# Patient Record
Sex: Male | Born: 1955 | Race: White | Hispanic: No | Marital: Single | Smoking: Never smoker
Health system: Southern US, Community
[De-identification: ages and names within clinical notes are randomized; demographics above are authoritative.]

## PROBLEM LIST (undated history)

## (undated) DIAGNOSIS — I10 Essential (primary) hypertension: Secondary | ICD-10-CM

## (undated) DIAGNOSIS — F419 Anxiety disorder, unspecified: Secondary | ICD-10-CM

## (undated) DIAGNOSIS — N393 Stress incontinence (female) (male): Secondary | ICD-10-CM

## (undated) DIAGNOSIS — E785 Hyperlipidemia, unspecified: Secondary | ICD-10-CM

## (undated) DIAGNOSIS — F411 Generalized anxiety disorder: Secondary | ICD-10-CM

## (undated) DIAGNOSIS — I2510 Atherosclerotic heart disease of native coronary artery without angina pectoris: Secondary | ICD-10-CM

## (undated) DIAGNOSIS — E538 Deficiency of other specified B group vitamins: Secondary | ICD-10-CM

## (undated) DIAGNOSIS — N183 Chronic kidney disease, stage 3 unspecified: Secondary | ICD-10-CM

## (undated) DIAGNOSIS — R519 Headache, unspecified: Secondary | ICD-10-CM

## (undated) DIAGNOSIS — Z8546 Personal history of malignant neoplasm of prostate: Secondary | ICD-10-CM

## (undated) DIAGNOSIS — K219 Gastro-esophageal reflux disease without esophagitis: Secondary | ICD-10-CM

## (undated) DIAGNOSIS — C801 Malignant (primary) neoplasm, unspecified: Secondary | ICD-10-CM

## (undated) HISTORY — DX: Essential (primary) hypertension: I10

## (undated) HISTORY — DX: Deficiency of other specified B group vitamins: E53.8

## (undated) HISTORY — DX: Chronic kidney disease, stage 3 unspecified: N18.30

## (undated) HISTORY — PX: PROSTATECTOMY: SHX69

## (undated) HISTORY — DX: Hyperlipidemia, unspecified: E78.5

## (undated) HISTORY — DX: Generalized anxiety disorder: F41.1

## (undated) HISTORY — DX: Personal history of malignant neoplasm of prostate: Z85.46

## (undated) HISTORY — DX: Stress incontinence (female) (male): N39.3

---

## 2005-06-03 ENCOUNTER — Encounter: Admission: RE | Admit: 2005-06-03 | Discharge: 2005-06-03 | Payer: Self-pay | Admitting: Internal Medicine

## 2016-02-04 ENCOUNTER — Other Ambulatory Visit: Payer: Self-pay | Admitting: Urology

## 2016-02-04 DIAGNOSIS — C61 Malignant neoplasm of prostate: Secondary | ICD-10-CM

## 2016-02-08 ENCOUNTER — Ambulatory Visit
Admission: RE | Admit: 2016-02-08 | Discharge: 2016-02-08 | Disposition: A | Discharge status: Home / Self Care | Payer: No Typology Code available for payment source | Source: Ambulatory Visit | Attending: Urology | Admitting: Urology

## 2016-02-08 DIAGNOSIS — C61 Malignant neoplasm of prostate: Secondary | ICD-10-CM

## 2016-02-08 MED ORDER — IOHEXOL 300 MG/ML  SOLN
30.0000 mL | Freq: Once | INTRAMUSCULAR | Status: AC | PRN
  Administered 2016-02-08: 30 mL via ORAL

## 2016-02-08 MED ORDER — IOPAMIDOL (ISOVUE-300) INJECTION 61%
100.0000 mL | Freq: Once | INTRAVENOUS | Status: AC | PRN
  Administered 2016-02-08: 100 mL via INTRAVENOUS

## 2019-08-26 DIAGNOSIS — R079 Chest pain, unspecified: Secondary | ICD-10-CM | POA: Diagnosis not present

## 2019-08-26 DIAGNOSIS — I10 Essential (primary) hypertension: Secondary | ICD-10-CM | POA: Diagnosis not present

## 2019-08-26 DIAGNOSIS — F419 Anxiety disorder, unspecified: Secondary | ICD-10-CM | POA: Diagnosis not present

## 2019-09-05 ENCOUNTER — Encounter: Payer: Self-pay | Admitting: Internal Medicine

## 2019-09-05 ENCOUNTER — Ambulatory Visit: Payer: PPO | Admitting: Internal Medicine

## 2019-09-05 ENCOUNTER — Other Ambulatory Visit: Payer: Self-pay

## 2019-09-05 VITALS — BP 138/80 | HR 58 | Temp 97.7°F | Ht 65.0 in | Wt 184.6 lb

## 2019-09-05 DIAGNOSIS — E785 Hyperlipidemia, unspecified: Secondary | ICD-10-CM

## 2019-09-05 DIAGNOSIS — R079 Chest pain, unspecified: Secondary | ICD-10-CM

## 2019-09-05 DIAGNOSIS — I10 Essential (primary) hypertension: Secondary | ICD-10-CM | POA: Diagnosis not present

## 2019-09-05 NOTE — Progress Notes (Signed)
Cardiology Office Note:    Date:  09/05/2019   ID:  Rodney Young, DOB 03/10/1956, MRN [id number]  PCP:  Rodney Roger, MD  Cardiologist:  Elouise Munroe, MD  Electrophysiologist:  None   Referring MD: Rodney Young, Rodney Cornea, MD   Chief Complaint: left chest pain shoulder pain  History of Present Illness:    Rodney Young is a 64 y.o. male with a hx of hypertension and hyperlipidemia presents today for evaluation of atypical chest pain.  His chest pain began in December 2020 and is intermittent.  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  His work is physical, and he will note that when he has to bend over and stand up when working, he will have pain on left side that he describes as sharp pain. Pain lasts 44mins.  He has to lift boxes and heavy items as part of his work. He feels this is a new sensation, and at times feels as if a knife is sticking in his back.he will also noticed that when he has to bend over his leg starts burning.  He notes that the discomfort in his chest is accompanied by a pain lifting his shoulder.  He demonstrates that he has difficulty lifting his arm above his head.  He also does heavy yard work including weed eating.  He is able to lift 50 pound bags of deer feed corn.  This activity does not bring on his chest discomfort or shoulder pain.  Nonsmoker. No diabetes.  Does have hyperlipidemia. Has indigestion and started opemprazole 40 mg   Father mi 20 died Brother open heart surgery and pacemaker Sister pacemaker Brother stents Past Medical History:  Diagnosis Date  . Hyperlipidemia   . Hypertension     History reviewed. No pertinent surgical history.  Current Medications: Current Meds  Medication Sig  . ALPRAZolam (XANAX) 0.5 MG tablet Take 0.5 mg by mouth 2 (two) times daily as needed.  Marland Kitchen amLODipine (NORVASC) 5 MG tablet Take 5 mg by mouth daily.  Marland Kitchen atorvastatin (LIPITOR) 20 MG tablet Take 20 mg by mouth. Take one tablet by mouth every  other day  . lisinopril-hydrochlorothiazide (ZESTORETIC) 20-12.5 MG tablet   . omeprazole (PRILOSEC) 40 MG capsule Take 40 mg by mouth daily.     Allergies:   Patient has no allergy information on record.   Social History   Socioeconomic History  . Marital status: Unknown    Spouse name: Not on file  . Number of children: Not on file  . Years of education: Not on file  . Highest education level: Not on file  Occupational History  . Not on file  Tobacco Use  . Smoking status: Never Smoker  . Smokeless tobacco: Never Used  Substance and Sexual Activity  . Alcohol use: Not Currently  . Drug use: Never  . Sexual activity: Not on file  Other Topics Concern  . Not on file  Social History Narrative  . Not on file   Social Determinants of Health   Financial Resource Strain:   . Difficulty of Paying Living Expenses: Not on file  Food Insecurity:   . Worried About Charity fundraiser in the Last Year: Not on file  . Ran Out of Food in the Last Year: Not on file  Transportation Needs:   . Lack of Transportation (Medical): Not on file  . Lack of Transportation (Non-Medical): Not on file  Physical Activity:   . Days  of Exercise per Week: Not on file  . Minutes of Exercise per Session: Not on file  Stress:   . Feeling of Stress : Not on file  Social Connections:   . Frequency of Communication with Friends and Family: Not on file  . Frequency of Social Gatherings with Friends and Family: Not on file  . Attends Religious Services: Not on file  . Active Member of Clubs or Organizations: Not on file  . Attends Archivist Meetings: Not on file  . Marital Status: Not on file     Family History: The patient's family history includes Heart attack in his father.  Per HPI.  ROS:   Please see the history of present illness.    All other systems reviewed and are negative.  EKGs/Labs/Other Studies Reviewed:    The following studies were reviewed today:  EKG:  Sinus  bradycardia rate 58  Recent Labs: No results found for requested labs within last 8760 hours.  Recent Lipid Panel No results found for: CHOL, TRIG, HDL, CHOLHDL, VLDL, LDLCALC, LDLDIRECT  Physical Exam:    VS:  BP 138/80   Pulse (!) 58   Temp 97.7 F (36.5 C)   Ht 5\' 5"  (1.651 m)   Wt 184 lb 9.6 oz (83.7 kg)   SpO2 98%   BMI 30.72 kg/m     Wt Readings from Last 5 Encounters:  09/05/19 184 lb 9.6 oz (83.7 kg)     Constitutional: No acute distress Eyes: sclera non-icteric, normal conjunctiva and lids ENMT: Mask in place over nose mouth Cardiovascular: regular rhythm, normal rate, no murmurs. S1 and S2 normal. Radial pulses normal bilaterally. No jugular venous distention.  Respiratory: clear to auscultation bilaterally GI : normal bowel sounds, soft and nontender. No distention.   MSK: extremities warm, well perfused. No edema.  Patient is able to abduct the left arm laterally to level of the shoulder, but is limited by pain beyond that point.  No significant point tenderness NEURO: grossly nonfocal exam, moves all extremities. PSYCH: alert and oriented x 3, normal mood and affect.      ASSESSMENT:    1. Chest pain of uncertain etiology   2. Essential hypertension   3. Hyperlipidemia, unspecified hyperlipidemia type    PLAN:    Chest pain-she has chest pain and left arm pain.  His shoulder pain sounds primarily musculoskeletal and I have recommended that he speak to his primary care physician about a rotator cuff injury.  His chest pain however does occur with activities at his work, and with his risk factors of hypertension, hyperlipidemia, and family history, it would be best to exclude ischemia.  This can be accomplished with an exercise treadmill test given his normal ECG.  If this is normal, it is unlikely that further testing is needed, however we can certainly perform a coronary calcium scan to further restratify if indicated, though patient is currently on Lipitor 20  mg every other day.  Per medical records provided by PCP, he did not tolerate pravastatin.  HTN -hypertension with reasonable control today, continue amlodipine, lisinopril-HCTZ.  Hyperlipidemia-continue Lipitor 20 mg every other day.  Patient is following lipids with his primary care physician, next check should be forwarded to our office.  Total time of encounter: 45 minutes total time of encounter, including 30 minutes spent in face-to-face patient care. This time includes coordination of care and counseling regarding above mentioned issues. Remainder of non-face-to-face time involved reviewing chart documents/testing relevant to the patient  encounter and documentation in the medical record.  I also reviewed approximately 7 pages of outside medical records in conjunction with this consultation.  Cherlynn Kaiser, MD [place]  [facility]   Medication Adjustments/Labs and Tests Ordered: Current medicines are reviewed at length with the patient today.  Concerns regarding medicines are outlined above.  Orders Placed This Encounter  Procedures  . EXERCISE TOLERANCE TEST (ETT)  . EKG 12-Lead   No orders of the defined types were placed in this encounter.   Patient Instructions  Medication Instructions:  No changes    Lab Work: Not needed  Testing/Procedures: Will be schedule at [address] you will need to have a covid test 3 days prior to test - please go to [address]  [facility]  3 days of self isolate before the test. Your physician has requested that you have an exercise tolerance test.  Please also follow instruction sheet, as given.  Follow-Up: At [facility], you and your health needs are our priority.  As part of our continuing mission to provide you with exceptional heart care, we have created designated Provider Care Teams.  These Care Teams include your primary Cardiologist (physician) and Advanced Practice  Providers (APPs -  Physician Assistants and Nurse Practitioners) who all work together to provide you with the care you need, when you need it.  Your next appointment:   1 month(s)  The format for your next appointment:   In Person  Provider:   Cherlynn Kaiser, MD  Other Instructions n/a

## 2019-09-05 NOTE — Patient Instructions (Addendum)
Medication Instructions:  No changes    Lab Work: Not needed  Testing/Procedures: Will be schedule at [address] you will need to have a covid test 3 days prior to test - please go to [address]  ( [facility] )  3 days of self isolate before the test. Your physician has requested that you have an exercise tolerance test.  Please also follow instruction sheet, as given.  Follow-Up: At [facility], you and your health needs are our priority.  As part of our continuing mission to provide you with exceptional heart care, we have created designated Provider Care Teams.  These Care Teams include your primary Cardiologist (physician) and Advanced Practice Providers (APPs -  Physician Assistants and Nurse Practitioners) who all work together to provide you with the care you need, when you need it.  Your next appointment:   1 month(s)  The format for your next appointment:   In Person  Provider:   Cherlynn Kaiser, MD  Other Instructions n/a

## 2019-09-18 ENCOUNTER — Encounter: Payer: Self-pay | Admitting: Internal Medicine

## 2019-10-03 ENCOUNTER — Telehealth (HOSPITAL_COMMUNITY): Payer: Self-pay

## 2019-10-03 NOTE — Telephone Encounter (Signed)
Encounter complete. 

## 2019-10-04 ENCOUNTER — Other Ambulatory Visit (HOSPITAL_COMMUNITY)
Admission: RE | Admit: 2019-10-04 | Discharge: 2019-10-04 | Disposition: A | Discharge status: Home / Self Care | Payer: PPO | Source: Ambulatory Visit | Attending: Internal Medicine | Admitting: Internal Medicine

## 2019-10-04 DIAGNOSIS — Z20822 Contact with and (suspected) exposure to covid-19: Secondary | ICD-10-CM | POA: Diagnosis not present

## 2019-10-04 LAB — SARS CORONAVIRUS 2 (TAT 6-24 HRS): SARS Coronavirus 2: NEGATIVE

## 2019-10-08 ENCOUNTER — Other Ambulatory Visit: Payer: Self-pay

## 2019-10-08 ENCOUNTER — Ambulatory Visit (HOSPITAL_COMMUNITY)
Admission: RE | Admit: 2019-10-08 | Discharge: 2019-10-08 | Disposition: A | Discharge status: Home / Self Care | Payer: PPO | Source: Ambulatory Visit | Attending: Internal Medicine | Admitting: Internal Medicine

## 2019-10-08 DIAGNOSIS — R079 Chest pain, unspecified: Secondary | ICD-10-CM | POA: Diagnosis not present

## 2019-10-08 LAB — EXERCISE TOLERANCE TEST
Estimated workload: 6.8 METS
Exercise duration (min): 4 min
Exercise duration (sec): 54 s
MPHR: 155 {beats}/min
Peak HR: 136 {beats}/min
Percent HR: 87 %
RPE: 18
Rest HR: 56 {beats}/min

## 2019-10-10 ENCOUNTER — Other Ambulatory Visit: Payer: Self-pay

## 2019-10-10 ENCOUNTER — Telehealth (INDEPENDENT_AMBULATORY_CARE_PROVIDER_SITE_OTHER): Payer: PPO | Admitting: Internal Medicine

## 2019-10-10 ENCOUNTER — Encounter: Payer: Self-pay | Admitting: Internal Medicine

## 2019-10-10 VITALS — BP 132/78 | HR 57 | Ht 65.0 in

## 2019-10-10 DIAGNOSIS — R079 Chest pain, unspecified: Secondary | ICD-10-CM

## 2019-10-10 DIAGNOSIS — R9439 Abnormal result of other cardiovascular function study: Secondary | ICD-10-CM

## 2019-10-10 MED ORDER — SODIUM CHLORIDE 0.9% FLUSH: 3.0000 mL | Freq: Two times a day (BID) | INTRAVENOUS | Status: DC

## 2019-10-10 NOTE — Progress Notes (Signed)
Virtual Visit via Telephone Note   This visit type was conducted due to national recommendations for restrictions regarding the COVID-19 Pandemic (e.g. social distancing) in an effort to limit this patient's exposure and mitigate transmission in our community.  Due to his co-morbid illnesses, this patient is at least at moderate risk for complications without adequate follow up.  This format is felt to be most appropriate for this patient at this time.  The patient did not have access to video technology/had technical difficulties with video requiring transitioning to audio format only (telephone).  All issues noted in this document were discussed and addressed.  No physical exam could be performed with this format.  Please refer to the patient's chart for his  consent to telehealth for [facility].   The patient was identified using 2 identifiers.  Date:  10/10/2019   ID:  Rodney Young, DOB [birth year], MRN [id number]  Patient Location: Home Provider Location: Office  PCP:  Townsend Roger, MD  Cardiologist:  Elouise Munroe, MD  Electrophysiologist:  None   Evaluation Performed:  Follow-Up Visit  Chief Complaint:  F/u abnormal stress test  History of Present Illness:    Rodney Young is a 64 y.o. male with hypertension and hyperlipidemia presents today for follow up of chest pain, with abnormal ETT.   Sunday morning chest was "hurting bad". He tells me he thinks something is wrong, and is concerned. Feels like a knife going through his back. This has been going on for 3-4 months.   He will come home and lay down for a while and goes away. Does not feel his work is strenuous, but does note more fatigue, and does recall chest pain after a busy day of work over the weekend.   He also tells me his leg feels like it's on fire from knee down. We discussed possible etiologies. Can consider peripheral arterial investigative studies.  We discussed results of ETT which is concerning  for ischemia.   Brother ppm, heart problems, stents Sister ppm  The patient does not have symptoms concerning for COVID-19 infection (fever, chills, cough, or new shortness of breath).    Past Medical History:  Diagnosis Date  . Hyperlipidemia   . Hypertension    No past surgical history on file.   Current Meds  Medication Sig  . ALPRAZolam (XANAX) 0.5 MG tablet Take 0.5 mg by mouth 2 (two) times daily as needed.  Marland Kitchen amLODipine (NORVASC) 5 MG tablet Take 5 mg by mouth daily.  Marland Kitchen atorvastatin (LIPITOR) 20 MG tablet Take 20 mg by mouth. Take one tablet by mouth every other day  . lisinopril-hydrochlorothiazide (ZESTORETIC) 20-12.5 MG tablet   . omeprazole (PRILOSEC) 40 MG capsule Take 40 mg by mouth daily.     Allergies:   Patient has no allergy information on record.   Social History   Tobacco Use  . Smoking status: Never Smoker  . Smokeless tobacco: Never Used  Substance Use Topics  . Alcohol use: Not Currently  . Drug use: Never     Family Hx: The patient's family history includes Heart attack in his father.  ROS:   Please see the history of present illness.     All other systems reviewed and are negative.   Prior CV studies:   The following studies were reviewed today:  ETT  Labs/Other Tests and Data Reviewed:    EKG:  No ECG reviewed.  Recent Labs: No results found for requested labs within last 8760  hours.   Recent Lipid Panel No results found for: CHOL, TRIG, HDL, CHOLHDL, LDLCALC, LDLDIRECT  Wt Readings from Last 3 Encounters:  09/05/19 184 lb 9.6 oz (83.7 kg)     Objective:    Vital Signs:  BP 132/78   Pulse (!) 57   Ht 5\' 5"  (1.651 m)   BMI 30.72 kg/m    VITAL SIGNS:  reviewed GEN:  no acute distress NEURO:  alert and oriented x 3, no obvious focal deficit PSYCH:  normal affect  ASSESSMENT & PLAN:    Chest pain Abnormal stress test -   We discussed his symptoms and abnormal stress test, and how this warrants a coronary angiogram  to define coronary anatomy. We discussed risks, benefits, alternatives of LHC and participated in shared decision making and informed consent. He is willing to proceed, we will schedule as soon as possible given high risk stress test findings and worsening chest pain over last 1 week.  INFORMED CONSENT: I have reviewed the risks, indications, and alternatives to cardiac catheterization, possible angioplasty, and stenting with the patient. Risks include but are not limited to bleeding, infection, vascular injury, stroke, myocardial infection, arrhythmia, kidney injury, radiation-related injury in the case of prolonged fluoroscopy use, emergency cardiac surgery, and death. The patient understands the risks of serious complication is 1-2 in 123XX123 with diagnostic cardiac cath and 1-2% or less with angioplasty/stenting.    COVID-19 Education: The signs and symptoms of COVID-19 were discussed with the patient and how to seek care for testing (follow up with PCP or arrange E-visit).  The importance of social distancing was discussed today.  Time:   Today, I have spent 25 minutes with the patient with telehealth technology discussing the above problems.     Medication Adjustments/Labs and Tests Ordered: Current medicines are reviewed at length with the patient today.  Concerns regarding medicines are outlined above.   Tests Ordered: No orders of the defined types were placed in this encounter.   Medication Changes: No orders of the defined types were placed in this encounter.   Follow Up: after cath  Signed, Elouise Munroe, MD  10/10/2019 8:51 AM    [place]

## 2019-10-10 NOTE — H&P (View-Only) (Signed)
Virtual Visit via Telephone Note   This visit type was conducted due to national recommendations for restrictions regarding the COVID-19 Pandemic (e.g. social distancing) in an effort to limit this patient's exposure and mitigate transmission in our community.  Due to his co-morbid illnesses, this patient is at least at moderate risk for complications without adequate follow up.  This format is felt to be most appropriate for this patient at this time.  The patient did not have access to video technology/had technical difficulties with video requiring transitioning to audio format only (telephone).  All issues noted in this document were discussed and addressed.  No physical exam could be performed with this format.  Please refer to the patient's chart for his  consent to telehealth for [facility].   The patient was identified using 2 identifiers.  Date:  10/10/2019   ID:  [name], DOB October 03, 1955, MRN [id number]  Patient Location: Home Provider Location: Office  PCP:  Townsend Roger, MD  Cardiologist:  Elouise Munroe, MD  Electrophysiologist:  None   Evaluation Performed:  Follow-Up Visit  Chief Complaint:  F/u abnormal stress test  History of Present Illness:    Rodney Young is a 64 y.o. male with hypertension and hyperlipidemia presents today for follow up of chest pain, with abnormal ETT.   Sunday morning chest was "hurting bad". He tells me he thinks something is wrong, and is concerned. Feels like a knife going through his back. This has been going on for 3-4 months.   He will come home and lay down for a while and goes away. Does not feel his work is strenuous, but does note more fatigue, and does recall chest pain after a busy day of work over the weekend.   He also tells me his leg feels like it's on fire from knee down. We discussed possible etiologies. Can consider peripheral arterial investigative studies.  We discussed results of ETT which is concerning  for ischemia.   Brother ppm, heart problems, stents Sister ppm  The patient does not have symptoms concerning for COVID-19 infection (fever, chills, cough, or new shortness of breath).    Past Medical History:  Diagnosis Date  . Hyperlipidemia   . Hypertension    No past surgical history on file.   Current Meds  Medication Sig  . ALPRAZolam (XANAX) 0.5 MG tablet Take 0.5 mg by mouth 2 (two) times daily as needed.  Marland Kitchen amLODipine (NORVASC) 5 MG tablet Take 5 mg by mouth daily.  Marland Kitchen atorvastatin (LIPITOR) 20 MG tablet Take 20 mg by mouth. Take one tablet by mouth every other day  . lisinopril-hydrochlorothiazide (ZESTORETIC) 20-12.5 MG tablet   . omeprazole (PRILOSEC) 40 MG capsule Take 40 mg by mouth daily.     Allergies:   Patient has no allergy information on record.   Social History   Tobacco Use  . Smoking status: Never Smoker  . Smokeless tobacco: Never Used  Substance Use Topics  . Alcohol use: Not Currently  . Drug use: Never     Family Hx: The patient's family history includes Heart attack in his father.  ROS:   Please see the history of present illness.     All other systems reviewed and are negative.   Prior CV studies:   The following studies were reviewed today:  ETT  Labs/Other Tests and Data Reviewed:    EKG:  No ECG reviewed.  Recent Labs: No results found for requested labs within last 8760  hours.   Recent Lipid Panel No results found for: CHOL, TRIG, HDL, CHOLHDL, LDLCALC, LDLDIRECT  Wt Readings from Last 3 Encounters:  09/05/19 184 lb 9.6 oz (83.7 kg)     Objective:    Vital Signs:  BP 132/78   Pulse (!) 57   Ht 5\' 5"  (1.651 m)   BMI 30.72 kg/m    VITAL SIGNS:  reviewed GEN:  no acute distress NEURO:  alert and oriented x 3, no obvious focal deficit PSYCH:  normal affect  ASSESSMENT & PLAN:    Chest pain Abnormal stress test -   We discussed his symptoms and abnormal stress test, and how this warrants a coronary angiogram  to define coronary anatomy. We discussed risks, benefits, alternatives of LHC and participated in shared decision making and informed consent. He is willing to proceed, we will schedule as soon as possible given high risk stress test findings and worsening chest pain over last 1 week.  INFORMED CONSENT: I have reviewed the risks, indications, and alternatives to cardiac catheterization, possible angioplasty, and stenting with the patient. Risks include but are not limited to bleeding, infection, vascular injury, stroke, myocardial infection, arrhythmia, kidney injury, radiation-related injury in the case of prolonged fluoroscopy use, emergency cardiac surgery, and death. The patient understands the risks of serious complication is 1-2 in 123XX123 with diagnostic cardiac cath and 1-2% or less with angioplasty/stenting.    COVID-19 Education: The signs and symptoms of COVID-19 were discussed with the patient and how to seek care for testing (follow up with PCP or arrange E-visit).  The importance of social distancing was discussed today.  Time:   Today, I have spent 25 minutes with the patient with telehealth technology discussing the above problems.     Medication Adjustments/Labs and Tests Ordered: Current medicines are reviewed at length with the patient today.  Concerns regarding medicines are outlined above.   Tests Ordered: No orders of the defined types were placed in this encounter.   Medication Changes: No orders of the defined types were placed in this encounter.   Follow Up: after cath  Signed, Elouise Munroe, MD  10/10/2019 8:51 AM    [place]

## 2019-10-10 NOTE — Patient Instructions (Addendum)
Medication Instructions:  Your physician recommends that you continue on your current medications as directed. Please refer to the Current Medication list given to you today.  If you need a refill on your cardiac medications before your next appointment, please call your pharmacy.   Lab work: BMET, CBC If you have labs (blood work) drawn today and your tests are completely normal, you will receive your results only by: [place] (if you have MyChart) OR A paper copy in the mail If you have any lab test that is abnormal or we need to change your treatment, we will call you to review the results.  Testing/Procedures: Your physician has requested that you have a cardiac catheterization on 3/15 at 7:30am. Cardiac catheterization is used to diagnose and/or treat various heart conditions. Doctors may recommend this procedure for a number of different reasons. The most common reason is to evaluate chest pain. Chest pain can be a symptom of coronary artery disease (CAD), and cardiac catheterization can show whether plaque is narrowing or blocking your heart's arteries. This procedure is also used to evaluate the valves, as well as measure the blood flow and oxygen levels in different parts of your heart. For further information please visit [email or web address]. Please follow instruction sheet, as given.   Follow-Up: At [facility], you and your health needs are our priority.  As part of our continuing mission to provide you with exceptional heart care, we have created designated Provider Care Teams.  These Care Teams include your primary Cardiologist (physician) and Advanced Practice Providers (APPs -  Physician Assistants and Nurse Practitioners) who all work together to provide you with the care you need, when you need it. You may see Elouise Munroe, MD or one of the following Advanced Practice Providers on your designated Care Team:    Rosaria Ferries, PA-C  Jory Sims, DNP,  ANP  Cadence Kathlen Mody, NP  Your follow-up appointment will be made in the hospital after your procedure.   Any Other Special Instructions Will Be Listed Below (If Applicable).    [place] [address] Dept: [phone] Loc: [phone]  Jerome Barrett  10/10/2019  You are scheduled for a Cardiac Catheterization on Monday, March 15 with Dr. Quay Burow.  1. Please arrive at the [facility] (Main Entrance A) at [facility]: [address] [place], [place] [address] at 7:30 AM (This time is two hours before your procedure to ensure your preparation). Free valet parking service is available.   Special note: Every effort is made to have your procedure done on time. Please understand that emergencies sometimes delay scheduled procedures.  2. Diet: Do not eat solid foods after midnight.  The patient may have clear liquids until 5am upon the day of the procedure.  3. Labs: You will need to have blood drawn either today or tomorrow.   4. Medication instructions in preparation for your procedure:   Contrast Allergy: No  On the morning of your procedure, take your Aspirin and any morning medicines NOT listed above.  You may use sips of water.  5. Plan for one night stay--bring personal belongings. 6. Bring a current list of your medications and current insurance cards. 7. You MUST have a responsible person to drive you home. 8. Someone MUST be with you the first 24 hours after you arrive home or your discharge will be delayed. 9. Please wear clothes that are easy to  get on and off and wear slip-on shoes.   You have to be tested for Covid on 3/12 at 1:25pm. This is a Drive Up Visit at the [facility] [address], [place]. Someone will direct you to the appropriate testing line. Stay in your car and someone will be with you shortly. Thank you for  allowing [place] to care for you!    -- [place] [facility]

## 2019-10-11 ENCOUNTER — Other Ambulatory Visit (HOSPITAL_COMMUNITY)
Admission: RE | Admit: 2019-10-11 | Discharge: 2019-10-11 | Disposition: A | Discharge status: Home / Self Care | Payer: PPO | Source: Ambulatory Visit | Attending: Cardiovascular Disease | Admitting: Cardiovascular Disease

## 2019-10-11 DIAGNOSIS — Z20822 Contact with and (suspected) exposure to covid-19: Secondary | ICD-10-CM | POA: Diagnosis not present

## 2019-10-11 DIAGNOSIS — Z01812 Encounter for preprocedural laboratory examination: Secondary | ICD-10-CM | POA: Insufficient documentation

## 2019-10-11 LAB — BASIC METABOLIC PANEL
BUN/Creatinine Ratio: 12 (ref 10–24)
BUN: 19 mg/dL (ref 8–27)
CO2: 24 mmol/L (ref 20–29)
Calcium: 9.6 mg/dL (ref 8.6–10.2)
Chloride: 100 mmol/L (ref 96–106)
Creatinine, Ser: 1.54 mg/dL — ABNORMAL HIGH (ref 0.76–1.27)
GFR calc Af Amer: 54 mL/min/{1.73_m2} — ABNORMAL LOW (ref >59)
GFR calc non Af Amer: 47 mL/min/{1.73_m2} — ABNORMAL LOW (ref >59)
Glucose: 91 mg/dL (ref 65–99)
Potassium: 4.1 mmol/L (ref 3.5–5.2)
Sodium: 140 mmol/L (ref 134–144)

## 2019-10-11 LAB — CBC

## 2019-10-12 LAB — SARS CORONAVIRUS 2 (TAT 6-24 HRS): SARS Coronavirus 2: NEGATIVE

## 2019-10-14 ENCOUNTER — Encounter (HOSPITAL_COMMUNITY)
Admission: RE | Disposition: A | Discharge status: Home / Self Care | Payer: Self-pay | Source: Home / Self Care | Attending: Cardiovascular Disease

## 2019-10-14 ENCOUNTER — Ambulatory Visit (HOSPITAL_COMMUNITY)
Admission: RE | Admit: 2019-10-14 | Discharge: 2019-10-14 | Disposition: A | Discharge status: Home / Self Care | Payer: PPO | Attending: Cardiovascular Disease | Admitting: Cardiovascular Disease

## 2019-10-14 ENCOUNTER — Other Ambulatory Visit: Payer: Self-pay

## 2019-10-14 DIAGNOSIS — R079 Chest pain, unspecified: Secondary | ICD-10-CM | POA: Diagnosis present

## 2019-10-14 DIAGNOSIS — I10 Essential (primary) hypertension: Secondary | ICD-10-CM | POA: Diagnosis not present

## 2019-10-14 DIAGNOSIS — Z79899 Other long term (current) drug therapy: Secondary | ICD-10-CM | POA: Insufficient documentation

## 2019-10-14 DIAGNOSIS — E785 Hyperlipidemia, unspecified: Secondary | ICD-10-CM | POA: Insufficient documentation

## 2019-10-14 DIAGNOSIS — R9439 Abnormal result of other cardiovascular function study: Secondary | ICD-10-CM

## 2019-10-14 DIAGNOSIS — I25110 Atherosclerotic heart disease of native coronary artery with unstable angina pectoris: Secondary | ICD-10-CM | POA: Diagnosis not present

## 2019-10-14 HISTORY — PX: LEFT HEART CATH AND CORONARY ANGIOGRAPHY: CATH118249

## 2019-10-14 HISTORY — DX: Abnormal result of other cardiovascular function study: R94.39

## 2019-10-14 LAB — CBC
HCT: 40.6 % (ref 39.0–52.0)
Hemoglobin: 13 g/dL (ref 13.0–17.0)
MCH: 27.3 pg (ref 26.0–34.0)
MCHC: 32 g/dL (ref 30.0–36.0)
MCV: 85.3 fL (ref 80.0–100.0)
Platelets: 224 10*3/uL (ref 150–400)
RBC: 4.76 MIL/uL (ref 4.22–5.81)
RDW: 13 % (ref 11.5–15.5)
WBC: 7.3 10*3/uL (ref 4.0–10.5)
nRBC: 0 % (ref 0.0–0.2)

## 2019-10-14 SURGERY — LEFT HEART CATH AND CORONARY ANGIOGRAPHY
Anesthesia: LOCAL

## 2019-10-14 MED ORDER — LIDOCAINE HCL (PF) 1 % IJ SOLN
INTRAMUSCULAR | Status: DC | PRN
  Administered 2019-10-14: 2 mL

## 2019-10-14 MED ORDER — VERAPAMIL HCL 2.5 MG/ML IV SOLN
INTRAVENOUS | Status: AC
  Filled 2019-10-14: qty 2

## 2019-10-14 MED ORDER — FENTANYL CITRATE (PF) 100 MCG/2ML IJ SOLN
INTRAMUSCULAR | Status: AC
  Filled 2019-10-14: qty 2

## 2019-10-14 MED ORDER — MIDAZOLAM HCL 2 MG/2ML IJ SOLN
INTRAMUSCULAR | Status: DC | PRN
  Administered 2019-10-14: 1 mg via INTRAVENOUS

## 2019-10-14 MED ORDER — SODIUM CHLORIDE 0.9 % IV SOLN: 250.0000 mL | INTRAVENOUS | Status: DC | PRN

## 2019-10-14 MED ORDER — MIDAZOLAM HCL 2 MG/2ML IJ SOLN
INTRAMUSCULAR | Status: AC
  Filled 2019-10-14: qty 2

## 2019-10-14 MED ORDER — ISOSORBIDE MONONITRATE ER 30 MG PO TB24: 30.0000 mg | ORAL_TABLET | Freq: Every day | ORAL | 0 refills | Status: DC

## 2019-10-14 MED ORDER — SODIUM CHLORIDE 0.9 % WEIGHT BASED INFUSION
3.0000 mL/kg/h | INTRAVENOUS | Status: AC
  Administered 2019-10-14: 09:00:00 3 mL/kg/h via INTRAVENOUS

## 2019-10-14 MED ORDER — HEPARIN (PORCINE) IN NACL 1000-0.9 UT/500ML-% IV SOLN
INTRAVENOUS | Status: AC
  Filled 2019-10-14: qty 1000

## 2019-10-14 MED ORDER — LIDOCAINE HCL (PF) 1 % IJ SOLN
INTRAMUSCULAR | Status: AC
  Filled 2019-10-14: qty 30

## 2019-10-14 MED ORDER — SODIUM CHLORIDE 0.9% FLUSH: 3.0000 mL | INTRAVENOUS | Status: DC | PRN

## 2019-10-14 MED ORDER — HEPARIN (PORCINE) IN NACL 1000-0.9 UT/500ML-% IV SOLN
INTRAVENOUS | Status: AC
  Filled 2019-10-14: qty 500

## 2019-10-14 MED ORDER — NITROGLYCERIN 1 MG/10 ML FOR IR/CATH LAB
INTRA_ARTERIAL | Status: AC
  Filled 2019-10-14: qty 10

## 2019-10-14 MED ORDER — HEPARIN SODIUM (PORCINE) 1000 UNIT/ML IJ SOLN
INTRAMUSCULAR | Status: DC | PRN
  Administered 2019-10-14: 4000 [IU] via INTRAVENOUS

## 2019-10-14 MED ORDER — HEPARIN SODIUM (PORCINE) 1000 UNIT/ML IJ SOLN
INTRAMUSCULAR | Status: AC
  Filled 2019-10-14: qty 1

## 2019-10-14 MED ORDER — IOHEXOL 350 MG/ML SOLN
INTRAVENOUS | Status: DC | PRN
  Administered 2019-10-14: 75 mL

## 2019-10-14 MED ORDER — FENTANYL CITRATE (PF) 100 MCG/2ML IJ SOLN
INTRAMUSCULAR | Status: DC | PRN
  Administered 2019-10-14: 25 ug via INTRAVENOUS

## 2019-10-14 MED ORDER — SODIUM CHLORIDE 0.9 % WEIGHT BASED INFUSION: 1.0000 mL/kg/h | INTRAVENOUS | Status: DC

## 2019-10-14 MED ORDER — ASPIRIN 81 MG PO CHEW
81.0000 mg | CHEWABLE_TABLET | ORAL | Status: AC
  Administered 2019-10-14: 09:00:00 81 mg via ORAL

## 2019-10-14 MED ORDER — VERAPAMIL HCL 2.5 MG/ML IV SOLN
INTRA_ARTERIAL | Status: DC | PRN
  Administered 2019-10-14: 10 mL via INTRA_ARTERIAL

## 2019-10-14 MED ORDER — HEPARIN (PORCINE) IN NACL 1000-0.9 UT/500ML-% IV SOLN
INTRAVENOUS | Status: DC | PRN
  Administered 2019-10-14 (×3): 500 mL

## 2019-10-14 MED ORDER — ASPIRIN 81 MG PO CHEW
81.0000 mg | CHEWABLE_TABLET | ORAL | Status: DC
  Filled 2019-10-14: qty 1

## 2019-10-14 SURGICAL SUPPLY — 15 items
CATH INFINITI 5FR ANG PIGTAIL (CATHETERS) ×1 IMPLANT
CATH INFINITI JR4 5F (CATHETERS) ×1 IMPLANT
CATH OPTITORQUE TIG 4.0 5F (CATHETERS) ×1 IMPLANT
DEVICE RAD COMP TR BAND LRG (VASCULAR PRODUCTS) ×1 IMPLANT
GLIDESHEATH SLEND A-KIT 6F 22G (SHEATH) ×1 IMPLANT
GUIDEWIRE INQWIRE 1.5J.035X260 (WIRE) IMPLANT
INQWIRE 1.5J .035X260CM (WIRE) ×2
KIT HEART LEFT (KITS) ×2 IMPLANT
PACK CARDIAC CATHETERIZATION (CUSTOM PROCEDURE TRAY) ×2 IMPLANT
SYR MEDRAD MARK 7 150ML (SYRINGE) ×2 IMPLANT
TRANSDUCER W/STOPCOCK (MISCELLANEOUS) ×2 IMPLANT
TUBING ART PRESS 72  MALE/FEM (TUBING) ×2
TUBING ART PRESS 72 MALE/FEM (TUBING) IMPLANT
TUBING CIL FLEX 10 FLL-RA (TUBING) ×2 IMPLANT
WIRE HI TORQ VERSACORE-J 145CM (WIRE) ×1 IMPLANT

## 2019-10-14 NOTE — Interval H&P Note (Signed)
Cath Lab Visit (complete for each Cath Lab visit)  Clinical Evaluation Leading to the Procedure:   ACS: No.  Non-ACS:    Anginal Classification: CCS II  Anti-ischemic medical therapy: No Therapy  Non-Invasive Test Results: Intermediate-risk stress test findings: cardiac mortality 1-3%/year  Prior CABG: No previous CABG      History and Physical Interval Note:  10/14/2019 11:13 AM  Rodney Young  has presented today for surgery, with the diagnosis of Chest pain.  The various methods of treatment have been discussed with the patient and family. After consideration of risks, benefits and other options for treatment, the patient has consented to  Procedure(s): LEFT HEART CATH AND CORONARY ANGIOGRAPHY (N/A) as a surgical intervention.  The patient's history has been reviewed, patient examined, no change in status, stable for surgery.  I have reviewed the patient's chart and labs.  Questions were answered to the patient's satisfaction.     Quay Burow

## 2019-10-14 NOTE — Progress Notes (Signed)
Discharge instructions reviewed with pt girlfriend Katharine Look (via telephone) and pt voices understanding.

## 2019-10-14 NOTE — Progress Notes (Signed)
TCTS consulted for outpatient CABG evaluation. [facility] office will call patient with an appointment.

## 2019-10-14 NOTE — Discharge Instructions (Signed)
Drink plenty of fluids Keep right arm at or above heart level  Radial Site Care  This sheet gives you information about how to care for yourself after your procedure. Your health care provider may also give you more specific instructions. If you have problems or questions, contact your health care provider. What can I expect after the procedure? After the procedure, it is common to have:  Bruising and tenderness at the catheter insertion area. Follow these instructions at home: Medicines  Take over-the-counter and prescription medicines only as told by your health care provider. Insertion site care  Follow instructions from your health care provider about how to take care of your insertion site. Make sure you: ? Wash your hands with soap and water before you change your bandage (dressing). If soap and water are not available, use hand sanitizer. ? Change your dressing as told by your health care provider. ? Leave stitches (sutures), skin glue, or adhesive strips in place. These skin closures may need to stay in place for 2 weeks or longer. If adhesive strip edges start to loosen and curl up, you may trim the loose edges. Do not remove adhesive strips completely unless your health care provider tells you to do that.  Check your insertion site every day for signs of infection. Check for: ? Redness, swelling, or pain. ? Fluid or blood. ? Pus or a bad smell. ? Warmth.  Do not take baths, swim, or use a hot tub until your health care provider approves.  You may shower 24-48 hours after the procedure, or as directed by your health care provider. ? Remove the dressing and gently wash the site with plain soap and water. ? Pat the area dry with a clean towel. ? Do not rub the site. That could cause bleeding.  Do not apply powder or lotion to the site. Activity   For 24 hours after the procedure, or as directed by your health care provider: ? Do not flex or bend the affected arm. ? Do  not push or pull heavy objects with the affected arm. ? Do not drive yourself home from the hospital or clinic. You may drive 24 hours after the procedure unless your health care provider tells you not to. ? Do not operate machinery or power tools.  Do not lift anything that is heavier than 10 lb (4.5 kg), or the limit that you are told, until your health care provider says that it is safe.  Ask your health care provider when it is okay to: ? Return to work or school. ? Resume usual physical activities or sports. ? Resume sexual activity. General instructions  If the catheter site starts to bleed, raise your arm and put firm pressure on the site. If the bleeding does not stop, get help right away. This is a medical emergency.  If you went home on the same day as your procedure, a responsible adult should be with you for the first 24 hours after you arrive home.  Keep all follow-up visits as told by your health care provider. This is important. Contact a health care provider if:  You have a fever.  You have redness, swelling, or yellow drainage around your insertion site. Get help right away if:  You have unusual pain at the radial site.  The catheter insertion area swells very fast.  The insertion area is bleeding, and the bleeding does not stop when you hold steady pressure on the area.  Your arm or hand   becomes pale, cool, tingly, or numb. These symptoms may represent a serious problem that is an emergency. Do not wait to see if the symptoms will go away. Get medical help right away. Call your local emergency services (911 in the [place] Do not drive yourself to the hospital. Summary  After the procedure, it is common to have bruising and tenderness at the site.  Follow instructions from your health care provider about how to take care of your radial site wound. Check the wound every day for signs of infection.  Do not lift anything that is heavier than 10 lb (4.5 kg), or the  limit that you are told, until your health care provider says that it is safe. This information is not intended to replace advice given to you by your health care provider. Make sure you discuss any questions you have with your health care provider. Document Revised: 08/23/2017 Document Reviewed: 08/23/2017 Elsevier Patient Education  2020 Elsevier Inc.  

## 2019-10-16 ENCOUNTER — Institutional Professional Consult (permissible substitution): Payer: PPO | Admitting: Cardiothoracic Surgery

## 2019-10-16 ENCOUNTER — Other Ambulatory Visit: Payer: Self-pay

## 2019-10-16 ENCOUNTER — Other Ambulatory Visit: Payer: Self-pay | Admitting: *Deleted

## 2019-10-16 ENCOUNTER — Encounter: Payer: Self-pay | Admitting: Cardiothoracic Surgery

## 2019-10-16 VITALS — BP 180/80 | HR 56 | Temp 97.7°F | Resp 20 | Ht 65.0 in | Wt 174.0 lb

## 2019-10-16 DIAGNOSIS — I2510 Atherosclerotic heart disease of native coronary artery without angina pectoris: Secondary | ICD-10-CM

## 2019-10-16 DIAGNOSIS — I2583 Coronary atherosclerosis due to lipid rich plaque: Secondary | ICD-10-CM | POA: Diagnosis not present

## 2019-10-16 HISTORY — DX: Atherosclerotic heart disease of native coronary artery without angina pectoris: I25.10

## 2019-10-16 NOTE — Progress Notes (Signed)
PCP is Townsend Roger, MD Referring Provider is Lorretta Harp, MD  Chief Complaint  Patient presents with  . Coronary Artery Disease    Surgical eval, Cardiac Cath 10/14/19    HPI: 64 year old nondiabetic non-smoker with symptoms of acute coronary syndrome.  Cardiac catheterization shows severe three-vessel coronary disease with preserved LV function.  No evidence of valvular disease.  Patient has been referred for surgical coronary vascularization.  I have reviewed the coronary arteriograms and he has adequate targets for grafting.  He would benefit from left IMA graft to the LAD, left radial artery graft to the OM and saphenous vein graft to the distal posterior descending.  The patient will be scheduled for surgery on Monday, March 22 at [facility].  I discussed the details of surgery with the patient and his wife including the benefits and risks.  He understands that the surgery will take 4-5 hours and he will be hospitalized for approximately 5 days.  He understands the risk of bleeding, stroke, MI, infection, organ failure.  He agrees to proceed with surgery.   Past Medical History:  Diagnosis Date  . Hyperlipidemia   . Hypertension     Past Surgical History:  Procedure Laterality Date  . LEFT HEART CATH AND CORONARY ANGIOGRAPHY N/A 10/14/2019   Procedure: LEFT HEART CATH AND CORONARY ANGIOGRAPHY;  Surgeon: Lorretta Harp, MD;  Location: [facility];  Service: Cardiovascular;  Laterality: N/A;    Family History  Problem Relation Age of Onset  . Heart attack Father     Social History Social History   Tobacco Use  . Smoking status: Never Smoker  . Smokeless tobacco: Never Used  Substance Use Topics  . Alcohol use: Not Currently  . Drug use: Never    Current Outpatient Medications  Medication Sig Dispense Refill  . ALPRAZolam (XANAX) 0.5 MG tablet Take 0.5 mg by mouth at bedtime.     Marland Kitchen amLODipine (NORVASC) 5 MG tablet Take 5 mg by mouth daily.    Marland Kitchen  aspirin EC 81 MG tablet Take 81 mg by mouth daily.    Marland Kitchen atorvastatin (LIPITOR) 20 MG tablet Take 20 mg by mouth every other day. Lunch    . isosorbide mononitrate (IMDUR) 30 MG 24 hr tablet Take 1 tablet (30 mg total) by mouth daily. 30 tablet 0  . lisinopril-hydrochlorothiazide (ZESTORETIC) 20-12.5 MG tablet Take 1 tablet by mouth in the morning and at bedtime.     Marland Kitchen omeprazole (PRILOSEC) 40 MG capsule Take 40 mg by mouth daily.    . vitamin B-12 (CYANOCOBALAMIN) 1000 MCG tablet Take 1,000 mcg by mouth daily.     Current Facility-Administered Medications  Medication Dose Route Frequency Provider Last Rate Last Admin  . sodium chloride flush (NS) 0.9 % injection 3 mL  3 mL Intravenous Q12H Elouise Munroe, MD        No Known Allergies  Review of Systems  Right-hand-dominant Patient had robotic total prostatectomy 3 years ago without anesthetic problems or bleeding. Currently has urine leakage and wears a depends pads No ankle edema no history of DVT No history of thoracic trauma pneumothorax or rib fracture No history of cardiac murmur No active dental problems or difficulty swallowing No history of syncope change in vision or TIA He does not smoke and rarely uses alcohol Patient is employed part-time at a [employer] in [employer]  BP (!) 180/80   Pulse (!) 56   Temp 97.7 F (36.5 C) (Skin)  Resp 20   Ht 5\' 5"  (1.651 m)   Wt 174 lb (78.9 kg)   SpO2 99% Comment: RA  BMI 28.96 kg/m  Physical Exam     Physical Exam  General: Short well-nourished middle-aged male no acute distress accompanied by wife HEENT: Normocephalic pupils equal , dentition adequate Neck: Supple without JVD, adenopathy, or bruit Chest: Clear to auscultation, symmetrical breath sounds, no rhonchi, no tenderness             or deformity Cardiovascular: Regular rate and rhythm, no murmur, no gallop, peripheral pulses             palpable in all extremities Abdomen:  Soft, nontender, no palpable mass or  organomegaly Extremities: Warm, well-perfused, no clubbing cyanosis edema or tenderness, no hematoma at cardiac cath site              no venous stasis changes of the legs Rectal/GU: Deferred Neuro: Grossly non--focal and symmetrical throughout Skin: Clean and dry without rash or ulceration   Diagnostic Tests: Coronary angiograms personally reviewed and discussed with patient and wife.  90% stenosis of the LAD, 90% stenosis of the OM and 90% stenosis of the distal PDA.  LVEDP normal.  Impression: Acute coronary syndrome with severe three-vessel coronary disease preserved LV function Patient was scheduled for [facility]G at [facility] March 22  Plan: Return for preop testing to be arranged through the office then surgery on Monday, March 22.  He will take his usual a.m. lisinopril dose before leaving the house.   Len Childs, MD [facility] [phone]

## 2019-10-16 NOTE — Pre-Procedure Instructions (Signed)
   [facility]  10/16/2019     [place], [place] - [address] [place] [address] Phone: [phone] Fax: [phone]    Your procedure is scheduled on Monday, October 21, 2019  Report to [place] at 5:30 A.M.  Call this number if you have problems the morning of surgery:  [phone]   Remember:Brush your teeth the morning of surgery with your regular toothpaste.  Do not eat or drink after midnight the night before surgery    Take these medicines the morning of surgery with A SIP OF WATER :   amLODipine (NORVASC)   omeprazole (PRILOSEC)  isosorbide mononitrate (IMDUR)   Stop taking vitamins, fish oil and herbal medications. Do not take any NSAIDs ie: Ibuprofen, Advil, Naproxen (Aleve), Motrin, BC and Goody Powder; stop now.  Do not wear jewelry  Do not wear lotions, powders, or perfumes, or deodorant.  Do not shave 48 hours prior to surgery.  Men may shave face and neck.  Do not bring valuables to the hospital.  [facility] is not responsible for any belongings or valuables.  Contacts, dentures or bridgework may not be worn into surgery.  Leave your suitcase in the car.  After surgery it may be brought to your room.  For patients admitted to the hospital, discharge time will be determined by your treatment team.  Special instructions: See " [facility] Preparing For Surgery " sheet.  Please read over the following fact sheets that you were given. Pain Booklet, Coughing and Deep Breathing, MRSA Information and Surgical Site Infection Prevention

## 2019-10-17 ENCOUNTER — Ambulatory Visit (HOSPITAL_COMMUNITY)
Admission: RE | Admit: 2019-10-17 | Discharge: 2019-10-17 | Disposition: A | Discharge status: Home / Self Care | Payer: PPO | Source: Ambulatory Visit | Attending: Cardiothoracic Surgery | Admitting: Cardiothoracic Surgery

## 2019-10-17 ENCOUNTER — Other Ambulatory Visit (HOSPITAL_COMMUNITY): Payer: PPO

## 2019-10-17 ENCOUNTER — Other Ambulatory Visit (HOSPITAL_COMMUNITY)
Admission: RE | Admit: 2019-10-17 | Discharge: 2019-10-17 | Disposition: A | Discharge status: Home / Self Care | Payer: PPO | Source: Ambulatory Visit | Attending: Cardiothoracic Surgery | Admitting: Cardiothoracic Surgery

## 2019-10-17 ENCOUNTER — Encounter (HOSPITAL_COMMUNITY): Payer: Self-pay

## 2019-10-17 ENCOUNTER — Encounter (HOSPITAL_COMMUNITY)
Admission: RE | Admit: 2019-10-17 | Discharge: 2019-10-17 | Disposition: A | Discharge status: Home / Self Care | Payer: PPO | Source: Ambulatory Visit | Attending: Cardiothoracic Surgery | Admitting: Cardiothoracic Surgery

## 2019-10-17 ENCOUNTER — Inpatient Hospital Stay (HOSPITAL_COMMUNITY): Admission: RE | Admit: 2019-10-17 | Payer: PPO | Source: Ambulatory Visit

## 2019-10-17 ENCOUNTER — Other Ambulatory Visit: Payer: Self-pay

## 2019-10-17 DIAGNOSIS — Z20822 Contact with and (suspected) exposure to covid-19: Secondary | ICD-10-CM | POA: Diagnosis not present

## 2019-10-17 DIAGNOSIS — I2510 Atherosclerotic heart disease of native coronary artery without angina pectoris: Secondary | ICD-10-CM

## 2019-10-17 DIAGNOSIS — E785 Hyperlipidemia, unspecified: Secondary | ICD-10-CM | POA: Insufficient documentation

## 2019-10-17 DIAGNOSIS — Z01818 Encounter for other preprocedural examination: Secondary | ICD-10-CM | POA: Diagnosis not present

## 2019-10-17 DIAGNOSIS — Z951 Presence of aortocoronary bypass graft: Secondary | ICD-10-CM | POA: Diagnosis not present

## 2019-10-17 DIAGNOSIS — I10 Essential (primary) hypertension: Secondary | ICD-10-CM | POA: Insufficient documentation

## 2019-10-17 HISTORY — DX: Anxiety disorder, unspecified: F41.9

## 2019-10-17 HISTORY — DX: Headache, unspecified: R51.9

## 2019-10-17 HISTORY — DX: Malignant (primary) neoplasm, unspecified: C80.1

## 2019-10-17 HISTORY — DX: Atherosclerotic heart disease of native coronary artery without angina pectoris: I25.10

## 2019-10-17 HISTORY — DX: Gastro-esophageal reflux disease without esophagitis: K21.9

## 2019-10-17 LAB — CBC
HCT: 40.7 % (ref 39.0–52.0)
Hemoglobin: 13.1 g/dL (ref 13.0–17.0)
MCH: 27.4 pg (ref 26.0–34.0)
MCHC: 32.2 g/dL (ref 30.0–36.0)
MCV: 85.1 fL (ref 80.0–100.0)
Platelets: 231 10*3/uL (ref 150–400)
RBC: 4.78 MIL/uL (ref 4.22–5.81)
RDW: 13 % (ref 11.5–15.5)
WBC: 8.5 10*3/uL (ref 4.0–10.5)
nRBC: 0 % (ref 0.0–0.2)

## 2019-10-17 LAB — COMPREHENSIVE METABOLIC PANEL
ALT: 22 U/L (ref 0–44)
AST: 19 U/L (ref 15–41)
Albumin: 4 g/dL (ref 3.5–5.0)
Alkaline Phosphatase: 50 U/L (ref 38–126)
Anion gap: 12 (ref 5–15)
BUN: 18 mg/dL (ref 8–23)
CO2: 23 mmol/L (ref 22–32)
Calcium: 9 mg/dL (ref 8.9–10.3)
Chloride: 100 mmol/L (ref 98–111)
Creatinine, Ser: 1.63 mg/dL — ABNORMAL HIGH (ref 0.61–1.24)
GFR calc Af Amer: 50 mL/min — ABNORMAL LOW (ref >60)
GFR calc non Af Amer: 44 mL/min — ABNORMAL LOW (ref >60)
Glucose, Bld: 139 mg/dL — ABNORMAL HIGH (ref 70–99)
Potassium: 4 mmol/L (ref 3.5–5.1)
Sodium: 135 mmol/L (ref 135–145)
Total Bilirubin: 0.8 mg/dL (ref 0.3–1.2)
Total Protein: 6.4 g/dL — ABNORMAL LOW (ref 6.5–8.1)

## 2019-10-17 LAB — BLOOD GAS, ARTERIAL
Acid-Base Excess: 0.6 mmol/L (ref 0.0–2.0)
Bicarbonate: 24.9 mmol/L (ref 20.0–28.0)
Drawn by: 421801
FIO2: 21
O2 Saturation: 98.3 %
Patient temperature: 37
pCO2 arterial: 41.2 mmHg (ref 32.0–48.0)
pH, Arterial: 7.398 (ref 7.350–7.450)
pO2, Arterial: 117 mmHg — ABNORMAL HIGH (ref 83.0–108.0)

## 2019-10-17 LAB — URINALYSIS, ROUTINE W REFLEX MICROSCOPIC
Bilirubin Urine: NEGATIVE
Glucose, UA: NEGATIVE mg/dL
Hgb urine dipstick: NEGATIVE
Ketones, ur: NEGATIVE mg/dL
Leukocytes,Ua: NEGATIVE
Nitrite: NEGATIVE
Protein, ur: NEGATIVE mg/dL
Specific Gravity, Urine: 1.019 (ref 1.005–1.030)
pH: 5 (ref 5.0–8.0)

## 2019-10-17 LAB — SURGICAL PCR SCREEN
MRSA, PCR: NEGATIVE
Staphylococcus aureus: NEGATIVE

## 2019-10-17 LAB — PROTIME-INR
INR: 1 (ref 0.8–1.2)
Prothrombin Time: 13.2 seconds (ref 11.4–15.2)

## 2019-10-17 LAB — TYPE AND SCREEN
ABO/RH(D): A NEG
Antibody Screen: NEGATIVE

## 2019-10-17 LAB — ABO/RH: ABO/RH(D): A NEG

## 2019-10-17 LAB — HEMOGLOBIN A1C
Hgb A1c MFr Bld: 6.1 % — ABNORMAL HIGH (ref 4.8–5.6)
Mean Plasma Glucose: 128.37 mg/dL

## 2019-10-17 LAB — SARS CORONAVIRUS 2 (TAT 6-24 HRS): SARS Coronavirus 2: NEGATIVE

## 2019-10-17 LAB — APTT: aPTT: 30 seconds (ref 24–36)

## 2019-10-17 NOTE — Progress Notes (Signed)
Pt denies any acute cardiopulmonary issues. Pt stated that he is under the care of Dr. Townsend Roger, PCP and Dr. Cherlynn Kaiser, Cardiology. Pt denies having an echo. Pt reminded to quarantine. Pt verbalized understanding of all pre-op instructions. Pt chart forwarded to PA, Anesthesiology, for review of cardiac history.

## 2019-10-17 NOTE — Progress Notes (Signed)
Nurse Spoke with Thurmond Butts, RN, to make surgeon aware that pt C/O headache with Imdur. Ryan made aware of anesthesia protocol to hold lisinopril-hydrochlorothiazide (ZESTORETIC) on morning of procedure after pt stated that he was instructed to take lisinopril-hydrochlorothiazide (ZESTORETIC) on morning of surgery; nurse advised pt to follow surgeon's instructions.

## 2019-10-17 NOTE — Research (Addendum)
[facility] Study  Visit 1/Screening Visit 1 labs and urine collected [facility] Informed Consent   Subject Name: Rodney Young  Subject met inclusion and exclusion criteria.  The informed consent form, study requirements and expectations were reviewed with the subject and questions and concerns were addressed prior to the signing of the consent form.  The subject verbalized understanding of the trail requirements.  The subject agreed to participate in the [facility] trial and signed the informed consent.  The informed consent was obtained prior to performance of any protocol-specific procedures for the subject.  A copy of the signed informed consent was given to the subject and a copy was placed in the subject's medical record.  Rodney Young   Reviewed of history and physical by investigator.   Yes No Inclusion  '[x]'$  '[]'$  Institutional Review Board (IRB)/Independent Ethics Committee (IEC)-approved written informed consent and privacy language as per national regulations (e.g., [place] for [place] sites) must be obtained from the subject or legally authorized representative prior to any study-related procedures (including withdrawal of prohibited medication).  '[x]'$  '[]'$  Subject agrees not to participate in another interventional study after signing the informed consent form (ICF) and until the end of study (EoS) visit has been completed.   '[x]'$  '[]'$  Subject is ? 66 years of age at time of screening (visit   '[x]'$  '[]'$  Subject undergoing non-emergent open chest cardiovascular surgery with use of CPB (i.e., CABG and/or valve surgery [including aortic root and ascending aorta surgery, without circulatory arrest]) within 4 weeks of screening (visit 1).  '[x]'$  '[]'$  Subject has moderate/high risk of developing AKI following surgery: '[x]'$  Only CABG, or single valve surgery: subject requires to have at least 2 AKI risk factors at screening '[]'$ Combined  CABG and surgery for 1 or more valves: subject requires to have at least 1 AKI risk factor at screening '[]'$ Surgery for more than 1 cardiac valve: subject requires to have at least 1 AKI risk factor at screening '[]'$ Surgery for aortic root or ascending aorta without circulatory arrest: subject requires to have at least 1 AKI risk factor at screening  AKI risk factors: '[]'$  Age at screening of ? 68 years '[x]'$  Documented history of eGFR < 60 mL/min per 1.73 m2 as per Modification of Diet in Renal Disease Study (MDRD) or CKD-EPI equation (or documented measured GFR assessment) '[]'$ o History needs to be present for at least 90 days prior to screening. Both SCr and eGFR need to be documented in the chart, and '[]'$ o eGFR at screening or baseline needs to be < 60 mL/min per 1.73 m2, as well as per CKD-EPI equation. '[]'$  Documented history of congestive heart failure requiring hospitalization. This condition should exist for at least 90 days prior to screening. '[]'$  Documented history of diabetes mellitus (type 1 or 2) of ? 90 days prior to screening, and subject is on active antidiabetic medication treatment for ? 90 days. '[]'$  Documented history of proteinuria/albuminuria at any time point before screening '[]'$ o Urinary dipstick result of ? 2+, OR '[]'$ o Documented urinary albumin creatinine ratio measurement of ? 300 mg/g, or '[]'$ o Documented total quantity of protein in a 24-hour urine collection test ? 0.3 g/day.  '[x]'$  '[]'$  Subject must have the ability, in the opinion of the investigator, and willingness to return for all scheduled visits and perform all assessments.  '[x]'$  '[]'$  A male subject is eligible to participate if she is not pregnant see [Appendix 12.3 Contraception Requirements] and at least 1 of  the following conditions applies: '[]'$  Not a woman of childbearing potential (WOCBP) as defined in [Appendix 12.3 Contraception Requirements] OR '[]'$  WOCBP who agrees to follow the contraceptive guidance as defined in  [Appendix 12.3 Contraception Requirements] throughout the treatment period and for at least 30 days after the final study drug administration.  '[x]'$  '[]'$  Male subject must agree not to breastfeed starting at screening and throughout the study period, and for 30 days after the final study drug administration.  '[x]'$  '[]'$  Male subject must not donate ova starting at screening and throughout the study period, and for 30 days after the final study drug administration.  '[x]'$  '[]'$  A male subject with male partner(s) of childbearing potential must agree to use contraception as detailed in [Appendix 12.3 Contraception Requirements] during the treatment period and for at least 30 days after the final study drug administration.  '[x]'$  '[]'$  A male subject must not donate sperm during the treatment period and for at least 30 days after the final study drug administration.  '[x]'$  '[]'$  Male subject with a pregnant or breastfeeding partner(s) must agree to remain abstinent or use a condom for the duration of the pregnancy or time partner is breastfeeding throughout the study period and for 30 days after the final study drug administration.    Exclusion  '[]'$  '[x]'$  Subject has received investigational drug within 30 days or 5 half-lives, whichever is longer, prior to screening.  '[]'$  '[x]'$  Subject has use of RRT within 30 days prior to screening.  '[]'$  '[x]'$  Subject has a known history of eGFR < 30 mL/min per 1.73 m2 as per CKD-EPI or MDRD equation at any time before screening.  '[]'$  '[x]'$  Subject has a prior kidney transplantation.  '[]'$  '[x]'$  Subject has a known or suspected glomerulonephritis (other than Diabetic Kidney Disease).  '[]'$  '[x]'$  Subject has confirmed or treated endocarditis, or other current active infection requiring antibiotic treatment, within 30 days prior to screening.  '[]'$  '[x]'$  Subject is using prohibited medications as specified in the concomitant medication section of the protocol [Section 5.1.3.1 Prohibited and Restricted  Treatment].  '[]'$  '[x]'$  Subject has a prior history of intravenous drug abuse within 1 year prior to screening.  '[]'$  '[x]'$  Subject has a known chronic liver disorder with Child-Pugh B or C classification.  '[]'$  '[x]'$  Subject has any of the following abnormal liver or kidney function parameters (as assessed in visit 1 sample. If 1 of results of central or local laboratory testing fulfill the criterion, the subjects will be eligible.): '[]'$  Alanine aminotransferase (ALT), aspartate aminotransferase (AST) to > 2 times the upper limit of normal (ULN) or bilirubin increased to > 1.5 times the ULN. '[]'$  eGFR < 30 mL/min per 1.73 m2 as per CKD-EPI equation.  '[]'$  '[x]'$  Subject has use of left ventricular assist device (LVAD), intra-aortic balloon pump (IABP) or other cardiac devices, or catecholamines within 7 days prior to screening.  '[]'$  '[x]'$  Subject has surgery scheduled to be performed without CPB (i.e., "Off-Pump" surgery).  '[]'$  '[x]'$  Subject has surgery scheduled to be performed under conditions of circulatory arrest, including planned deep hypothermic circulatory arrest.  '[]'$  '[x]'$  Subject has surgery scheduled for aortic dissection.  '[]'$  '[x]'$  Subject has surgery for a condition that is immediately life-threatening as per the discretion of the investigator.  '[]'$  '[x]'$  Subject has surgery scheduled to correct major congenital heart defect.   '[]'$  '[x]'$  Subject has current or previous malignant disease. Subjects with a history of cancer are considered eligible if the subject has undergone therapy  and the subject has been considered disease free or progression free for at least 5 years. Subject with completely excised basal cell carcinoma or squamous cell carcinoma of the skin and completely excised cervical cancer in situ are also considered eligible.    Preoperatively at the Day of Surgery:  '[x]'$  '[]'$  Exclusion criteria 1 to 17 are applicable at this time.  '[]'$  '[x]'$  Subject has AKI (any stage) present at presurgery baseline at the discretion  of the investigator.  '[]'$  '[x]'$  Subject has known or suspected infection/sepsis at time of presurgery baseline at the discretion of investigator.    Perioperative Exclusion Criteria:  '[]'$  '[x]'$  Subject requires Extracorporeal Membrane Oxygenation (ECMO) during or after completion of surgery.  '[]'$  '[x]'$  Subject requires ventricular assist device (VAD) during or after completion of surgery.  '[]'$  '[x]'$  Subject has surgery performed "Off-Pump" at any time during surgery.    General:  '[]'$  '[x]'$  Subject has other condition, which, in the investigator's opinion, makes the subject unsuitable for study participation.  '[]'$  '[x]'$  Male subject who is pregnant or lactating or has a positive pregnancy test within 72 hours prior to screening and/or randomization, has been pregnant within 6 months before screening assessment or breastfeeding within 3 months before screening or who is planning to become pregnant within the total study period.  '[]'$  '[x]'$  Subject has a known or suspected hypersensitivity to KGM0102 or any components of the formulation used.  '[]'$  '[x]'$  Subject is an employee of the [employer] or the [employer]ician ([employer]) involved in the study.   [id number]  MOBILITY:    I HAVE NO PROBLEMS WALKING '[x]'$   I HAVE SLIGHT PROBLEMS WALKING '[]'$   I HAVE MODERATE PROBLEMS WALKING '[]'$   I HAVE SEVERE PROBLEMS WALKING '[]'$   I AM UNABLE TO WALK  '[]'$     SELF-CARE:   I HAVE NO PROBLEMS WASHING OR DRESSING MYSELF  '[x]'$   I HAVE SLIGHT PROBLEMS WASHING OR DRESSING MYSELF  '[]'$   I HAVE MODERATE PROBLEMS WASHING OR DRESSING MYSELF '[]'$   I HAVE SEVERE PROBLEMS WASHING OR DRESSING MYSELF  '[]'$   I HAVE SEVERE PROBLEMS WASHING OR DRESSING MYSELF  '[]'$   I AM UNABLE TO WASH OR DRESS MYSELF '[]'$     USUAL ACTIVITIES: (E.G. WORK/STUDY/HOUSEWORK/FAMILY OR LEISURE ACTIVITIES.    I HAVE NO PROBLEMS DOING MY USUAL ACTIVITIES '[x]'$   I HAVE SLIGHT PROBLEMS DOING MY USUAL ACTIVITIES '[]'$   I HAVE MODERATE PROBLEMS DOING MY USUAL ACTIVIITIES '[]'$   I  HAVE SEVERE PROBLEMS DOING MY USUAL ACTIVITIES '[]'$   I AM UNABLE TO DO MY USUAL ACTIVITIES '[]'$     PAIN /DISCOMFORT   I HAVE NO PAIN OR DISCOMFORT '[x]'$   I HAVE SLIGHT PAIN OR DISCOMFORT '[]'$   I HAVE MODERATE PAIN OR DISCOMFORT '[]'$   I HAVE SEVERE PAIN OR DISCOMFORT '[]'$   I HAVE EXTREME PAIN OR DISCOMFORT '[]'$     ANXIETY/DEPRESSION   I AM NOT ANXIOUS OR DEPRESSED '[x]'$   I AM SLIGHTLY ANXIOUS OR DEPRESSED '[]'$   I AM MODERATELY ANXIOUS OR DREPRESSED '[]'$   I AM SEVERELY ANXIOUS OR DEPRESSED '[]'$   I AM EXTREMELY ANXIOUS OR DEPRESSED '[]'$     SCALE OF 0-100 HOW WOULD YOU RATE TODAY?  0 IS THE WORSE AND 100 IS THE BEST HEALTH YOU CAN IMAGINE: 75      ABNORMAL LABS   '[x]'$    No change or change not clinically significant. NO FOLLOW UP REQUIRED. '[]'$    Change clinically significant and attributable to disease or management. NO FOLLOW UP  REQUIRED. '[]'$   Change clinically significant and possibly attributable to study medication. NO FOLLOW UP REQUIRED. '[]'$    Change clinically significant and attributable to study medication. FOLLOW UP REQUIRED. '[]'$    Apparent lab error. '[]'$    Unevaluable.     ABNORMAL LABS   '[]'$    No change or change not clinically significant. NO FOLLOW UP REQUIRED. '[x]'$    Change clinically significant and attributable to disease or management. NO FOLLOW UP  REQUIRED. '[]'$    Change clinically significant and possibly attributable to study medication. NO FOLLOW UP REQUIRED. '[]'$    Change clinically significant and attributable to study medication. FOLLOW UP REQUIRED. '[]'$    Apparent lab error. '[]'$    Unevaluable.

## 2019-10-18 MED ORDER — DEXMEDETOMIDINE HCL IN NACL 400 MCG/100ML IV SOLN
0.1000 ug/kg/h | INTRAVENOUS | Status: AC
  Administered 2019-10-21: .5 ug/kg/h via INTRAVENOUS
  Filled 2019-10-18: qty 100

## 2019-10-18 MED ORDER — TRANEXAMIC ACID (OHS) PUMP PRIME SOLUTION
2.0000 mg/kg | INTRAVENOUS | Status: DC
  Filled 2019-10-18: qty 1.64

## 2019-10-18 MED ORDER — PHENYLEPHRINE HCL-NACL 20-0.9 MG/250ML-% IV SOLN
30.0000 ug/min | INTRAVENOUS | Status: AC
  Administered 2019-10-21: 25 ug/min via INTRAVENOUS
  Filled 2019-10-18: qty 250

## 2019-10-18 MED ORDER — MILRINONE LACTATE IN DEXTROSE 20-5 MG/100ML-% IV SOLN
0.3000 ug/kg/min | INTRAVENOUS | Status: AC
  Administered 2019-10-21: .25 ug/kg/min via INTRAVENOUS
  Filled 2019-10-18: qty 100

## 2019-10-18 MED ORDER — MAGNESIUM SULFATE 50 % IJ SOLN
40.0000 meq | INTRAMUSCULAR | Status: DC
  Filled 2019-10-18: qty 9.85

## 2019-10-18 MED ORDER — POTASSIUM CHLORIDE 2 MEQ/ML IV SOLN
80.0000 meq | INTRAVENOUS | Status: DC
  Filled 2019-10-18: qty 40

## 2019-10-18 MED ORDER — SODIUM CHLORIDE 0.9 % IV SOLN
750.0000 mg | INTRAVENOUS | Status: AC
  Administered 2019-10-21: 750 mg via INTRAVENOUS
  Filled 2019-10-18: qty 750

## 2019-10-18 MED ORDER — TRANEXAMIC ACID 1000 MG/10ML IV SOLN
1.5000 mg/kg/h | INTRAVENOUS | Status: AC
  Administered 2019-10-21: 1.5 mg/kg/h via INTRAVENOUS
  Filled 2019-10-18: qty 25

## 2019-10-18 MED ORDER — PLASMA-LYTE 148 IV SOLN
INTRAVENOUS | Status: DC
  Filled 2019-10-18: qty 2.5

## 2019-10-18 MED ORDER — NOREPINEPHRINE 4 MG/250ML-% IV SOLN
0.0000 ug/min | INTRAVENOUS | Status: DC
  Filled 2019-10-18: qty 250

## 2019-10-18 MED ORDER — TRANEXAMIC ACID (OHS) BOLUS VIA INFUSION
15.0000 mg/kg | INTRAVENOUS | Status: AC
  Administered 2019-10-21: 1231.5 mg via INTRAVENOUS
  Filled 2019-10-18: qty 1232

## 2019-10-18 MED ORDER — EPINEPHRINE HCL 5 MG/250ML IV SOLN IN NS
0.0000 ug/min | INTRAVENOUS | Status: DC
  Filled 2019-10-18: qty 250

## 2019-10-18 MED ORDER — SODIUM CHLORIDE 0.9 % IV SOLN
INTRAVENOUS | Status: DC
  Filled 2019-10-18: qty 30

## 2019-10-18 MED ORDER — NITROGLYCERIN IN D5W 200-5 MCG/ML-% IV SOLN
2.0000 ug/min | INTRAVENOUS | Status: DC
  Administered 2019-10-21: 5 ug/min via INTRAVENOUS
  Filled 2019-10-18: qty 250

## 2019-10-18 MED ORDER — SODIUM CHLORIDE 0.9 % IV SOLN
1.5000 g | INTRAVENOUS | Status: AC
  Administered 2019-10-21: 1.5 g via INTRAVENOUS
  Filled 2019-10-18: qty 1.5

## 2019-10-18 MED ORDER — INSULIN REGULAR(HUMAN) IN NACL 100-0.9 UT/100ML-% IV SOLN
INTRAVENOUS | Status: AC
  Administered 2019-10-21: .7 [IU]/h via INTRAVENOUS
  Filled 2019-10-18: qty 100

## 2019-10-18 MED ORDER — VANCOMYCIN HCL 1250 MG/250ML IV SOLN
1250.0000 mg | INTRAVENOUS | Status: AC
  Administered 2019-10-21: 1250 mg via INTRAVENOUS
  Filled 2019-10-18: qty 250

## 2019-10-20 NOTE — Anesthesia Preprocedure Evaluation (Addendum)
Anesthesia Evaluation  Patient identified by MRN, date of birth, ID band Patient awake    Reviewed: Allergy & Precautions, NPO status , Patient's Chart, lab work & pertinent test results  Airway Mallampati: II  TM Distance: >3 FB Neck ROM: Full    Dental no notable dental hx.    Pulmonary neg pulmonary ROS,    Pulmonary exam normal breath sounds clear to auscultation       Cardiovascular hypertension, Pt. on medications + CAD  Normal cardiovascular exam Rhythm:Regular Rate:Normal  ECG: SB, rate 56  CATH: Dist RCA lesion is 90% stenosed. Prox Cx to Mid Cx lesion is 80% stenosed. Mid LAD lesion is 75% stenosed. The left ventricular systolic function is normal. LV end diastolic pressure is normal. The left ventricular ejection fraction is 55-65% by visual estimate.   Neuro/Psych  Headaches, Anxiety    GI/Hepatic Neg liver ROS, GERD  Medicated and Controlled,  Endo/Other  negative endocrine ROS  Renal/GU Renal InsufficiencyRenal disease     Musculoskeletal negative musculoskeletal ROS (+)   Abdominal   Peds  Hematology HLD   Anesthesia Other Findings CAD  Reproductive/Obstetrics                            Anesthesia Physical Anesthesia Plan  ASA: IV  Anesthesia Plan: General   Post-op Pain Management:    Induction: Intravenous  PONV Risk Score and Plan: 2 and Ondansetron, Dexamethasone, Midazolam and Treatment may vary due to age or medical condition  Airway Management Planned: Oral ETT  Additional Equipment: Arterial line, CVP, PA Cath, TEE and Ultrasound Guidance Line Placement  Intra-op Plan:   Post-operative Plan: Post-operative intubation/ventilation  Informed Consent: I have reviewed the patients History and Physical, chart, labs and discussed the procedure including the risks, benefits and alternatives for the proposed anesthesia with the patient or authorized  representative who has indicated his/her understanding and acceptance.     Dental advisory given  Plan Discussed with: CRNA  Anesthesia Plan Comments: (Right radial arterial line)       Anesthesia Quick Evaluation

## 2019-10-21 ENCOUNTER — Inpatient Hospital Stay (HOSPITAL_COMMUNITY): Payer: PPO

## 2019-10-21 ENCOUNTER — Inpatient Hospital Stay (HOSPITAL_COMMUNITY): Payer: PPO | Admitting: Physician Assistant

## 2019-10-21 ENCOUNTER — Encounter (HOSPITAL_COMMUNITY): Payer: Self-pay | Admitting: Cardiothoracic Surgery

## 2019-10-21 ENCOUNTER — Inpatient Hospital Stay (HOSPITAL_COMMUNITY)
Admission: RE | Disposition: A | Discharge status: Home / Self Care | Payer: Self-pay | Source: Home / Self Care | Attending: Cardiothoracic Surgery

## 2019-10-21 ENCOUNTER — Inpatient Hospital Stay (HOSPITAL_COMMUNITY)
Admission: RE | Admit: 2019-10-21 | Discharge: 2019-10-27 | DRG: 236 | Disposition: A | Discharge status: Home / Self Care | Payer: PPO | Attending: Cardiothoracic Surgery | Admitting: Cardiothoracic Surgery

## 2019-10-21 ENCOUNTER — Other Ambulatory Visit: Payer: Self-pay

## 2019-10-21 ENCOUNTER — Inpatient Hospital Stay (HOSPITAL_COMMUNITY): Payer: PPO | Admitting: Certified Registered Nurse Anesthetist

## 2019-10-21 DIAGNOSIS — Z09 Encounter for follow-up examination after completed treatment for conditions other than malignant neoplasm: Secondary | ICD-10-CM

## 2019-10-21 DIAGNOSIS — Z978 Presence of other specified devices: Secondary | ICD-10-CM

## 2019-10-21 DIAGNOSIS — E8779 Other fluid overload: Secondary | ICD-10-CM | POA: Diagnosis not present

## 2019-10-21 DIAGNOSIS — J9811 Atelectasis: Secondary | ICD-10-CM | POA: Diagnosis not present

## 2019-10-21 DIAGNOSIS — Z888 Allergy status to other drugs, medicaments and biological substances status: Secondary | ICD-10-CM | POA: Diagnosis not present

## 2019-10-21 DIAGNOSIS — Z8249 Family history of ischemic heart disease and other diseases of the circulatory system: Secondary | ICD-10-CM | POA: Diagnosis not present

## 2019-10-21 DIAGNOSIS — I10 Essential (primary) hypertension: Secondary | ICD-10-CM | POA: Diagnosis present

## 2019-10-21 DIAGNOSIS — F419 Anxiety disorder, unspecified: Secondary | ICD-10-CM | POA: Diagnosis not present

## 2019-10-21 DIAGNOSIS — I2510 Atherosclerotic heart disease of native coronary artery without angina pectoris: Principal | ICD-10-CM | POA: Diagnosis present

## 2019-10-21 DIAGNOSIS — R931 Abnormal findings on diagnostic imaging of heart and coronary circulation: Secondary | ICD-10-CM | POA: Diagnosis not present

## 2019-10-21 DIAGNOSIS — D62 Acute posthemorrhagic anemia: Secondary | ICD-10-CM | POA: Diagnosis not present

## 2019-10-21 DIAGNOSIS — Z4682 Encounter for fitting and adjustment of non-vascular catheter: Secondary | ICD-10-CM | POA: Diagnosis not present

## 2019-10-21 DIAGNOSIS — J90 Pleural effusion, not elsewhere classified: Secondary | ICD-10-CM | POA: Diagnosis not present

## 2019-10-21 DIAGNOSIS — Z8546 Personal history of malignant neoplasm of prostate: Secondary | ICD-10-CM

## 2019-10-21 DIAGNOSIS — K219 Gastro-esophageal reflux disease without esophagitis: Secondary | ICD-10-CM | POA: Diagnosis not present

## 2019-10-21 DIAGNOSIS — E785 Hyperlipidemia, unspecified: Secondary | ICD-10-CM | POA: Diagnosis not present

## 2019-10-21 DIAGNOSIS — Z9079 Acquired absence of other genital organ(s): Secondary | ICD-10-CM | POA: Diagnosis not present

## 2019-10-21 DIAGNOSIS — Z951 Presence of aortocoronary bypass graft: Secondary | ICD-10-CM

## 2019-10-21 DIAGNOSIS — R0602 Shortness of breath: Secondary | ICD-10-CM | POA: Diagnosis not present

## 2019-10-21 DIAGNOSIS — I25110 Atherosclerotic heart disease of native coronary artery with unstable angina pectoris: Secondary | ICD-10-CM | POA: Diagnosis not present

## 2019-10-21 DIAGNOSIS — D6959 Other secondary thrombocytopenia: Secondary | ICD-10-CM | POA: Diagnosis not present

## 2019-10-21 HISTORY — PX: RADIAL ARTERY HARVEST: SHX5067

## 2019-10-21 HISTORY — DX: Presence of aortocoronary bypass graft: Z95.1

## 2019-10-21 HISTORY — PX: TEE WITHOUT CARDIOVERSION: SHX5443

## 2019-10-21 HISTORY — PX: CORONARY ARTERY BYPASS GRAFT: SHX141

## 2019-10-21 LAB — POCT I-STAT 7, (LYTES, BLD GAS, ICA,H+H)
Acid-Base Excess: 2 mmol/L (ref 0.0–2.0)
Acid-Base Excess: 3 mmol/L — ABNORMAL HIGH (ref 0.0–2.0)
Acid-Base Excess: 4 mmol/L — ABNORMAL HIGH (ref 0.0–2.0)
Acid-Base Excess: 1 mmol/L (ref 0.0–2.0)
Acid-Base Excess: 2 mmol/L (ref 0.0–2.0)
Acid-base deficit: 1 mmol/L (ref 0.0–2.0)
Acid-base deficit: 2 mmol/L (ref 0.0–2.0)
Acid-base deficit: 3 mmol/L — ABNORMAL HIGH (ref 0.0–2.0)
Bicarbonate: 28.3 mmol/L — ABNORMAL HIGH (ref 20.0–28.0)
Bicarbonate: 26.6 mmol/L (ref 20.0–28.0)
Bicarbonate: 28.1 mmol/L — ABNORMAL HIGH (ref 20.0–28.0)
Bicarbonate: 26 mmol/L (ref 20.0–28.0)
Bicarbonate: 26.6 mmol/L (ref 20.0–28.0)
Bicarbonate: 24.6 mmol/L (ref 20.0–28.0)
Bicarbonate: 23.4 mmol/L (ref 20.0–28.0)
Bicarbonate: 23 mmol/L (ref 20.0–28.0)
Calcium, Ion: 1.26 mmol/L (ref 1.15–1.40)
Calcium, Ion: 0.97 mmol/L — ABNORMAL LOW (ref 1.15–1.40)
Calcium, Ion: 1.04 mmol/L — ABNORMAL LOW (ref 1.15–1.40)
Calcium, Ion: 1 mmol/L — ABNORMAL LOW (ref 1.15–1.40)
Calcium, Ion: 1.02 mmol/L — ABNORMAL LOW (ref 1.15–1.40)
Calcium, Ion: 1.06 mmol/L — ABNORMAL LOW (ref 1.15–1.40)
Calcium, Ion: 1.14 mmol/L — ABNORMAL LOW (ref 1.15–1.40)
Calcium, Ion: 1.12 mmol/L — ABNORMAL LOW (ref 1.15–1.40)
HCT: 36 % — ABNORMAL LOW (ref 39.0–52.0)
HCT: 26 % — ABNORMAL LOW (ref 39.0–52.0)
HCT: 25 % — ABNORMAL LOW (ref 39.0–52.0)
HCT: 24 % — ABNORMAL LOW (ref 39.0–52.0)
HCT: 25 % — ABNORMAL LOW (ref 39.0–52.0)
HCT: 32 % — ABNORMAL LOW (ref 39.0–52.0)
HCT: 33 % — ABNORMAL LOW (ref 39.0–52.0)
HCT: 28 % — ABNORMAL LOW (ref 39.0–52.0)
Hemoglobin: 12.2 g/dL — ABNORMAL LOW (ref 13.0–17.0)
Hemoglobin: 8.8 g/dL — ABNORMAL LOW (ref 13.0–17.0)
Hemoglobin: 8.5 g/dL — ABNORMAL LOW (ref 13.0–17.0)
Hemoglobin: 8.2 g/dL — ABNORMAL LOW (ref 13.0–17.0)
Hemoglobin: 8.5 g/dL — ABNORMAL LOW (ref 13.0–17.0)
Hemoglobin: 10.9 g/dL — ABNORMAL LOW (ref 13.0–17.0)
Hemoglobin: 11.2 g/dL — ABNORMAL LOW (ref 13.0–17.0)
Hemoglobin: 9.5 g/dL — ABNORMAL LOW (ref 13.0–17.0)
O2 Saturation: 100 %
O2 Saturation: 100 %
O2 Saturation: 100 %
O2 Saturation: 100 %
O2 Saturation: 100 %
O2 Saturation: 99 %
O2 Saturation: 99 %
O2 Saturation: 99 %
Patient temperature: 35.9
Patient temperature: 36.6
Patient temperature: 36.8
Potassium: 3.7 mmol/L (ref 3.5–5.1)
Potassium: 4.3 mmol/L (ref 3.5–5.1)
Potassium: 4.5 mmol/L (ref 3.5–5.1)
Potassium: 3.6 mmol/L (ref 3.5–5.1)
Potassium: 4.4 mmol/L (ref 3.5–5.1)
Potassium: 3.8 mmol/L (ref 3.5–5.1)
Potassium: 4.6 mmol/L (ref 3.5–5.1)
Potassium: 4.5 mmol/L (ref 3.5–5.1)
Sodium: 140 mmol/L (ref 135–145)
Sodium: 140 mmol/L (ref 135–145)
Sodium: 136 mmol/L (ref 135–145)
Sodium: 137 mmol/L (ref 135–145)
Sodium: 139 mmol/L (ref 135–145)
Sodium: 139 mmol/L (ref 135–145)
Sodium: 138 mmol/L (ref 135–145)
Sodium: 139 mmol/L (ref 135–145)
TCO2: 30 mmol/L (ref 22–32)
TCO2: 28 mmol/L (ref 22–32)
TCO2: 29 mmol/L (ref 22–32)
TCO2: 27 mmol/L (ref 22–32)
TCO2: 28 mmol/L (ref 22–32)
TCO2: 26 mmol/L (ref 22–32)
TCO2: 25 mmol/L (ref 22–32)
TCO2: 24 mmol/L (ref 22–32)
pCO2 arterial: 49.6 mmHg — ABNORMAL HIGH (ref 32.0–48.0)
pCO2 arterial: 35.9 mmHg (ref 32.0–48.0)
pCO2 arterial: 39.5 mmHg (ref 32.0–48.0)
pCO2 arterial: 40.5 mmHg (ref 32.0–48.0)
pCO2 arterial: 42.1 mmHg (ref 32.0–48.0)
pCO2 arterial: 39.9 mmHg (ref 32.0–48.0)
pCO2 arterial: 42.4 mmHg (ref 32.0–48.0)
pCO2 arterial: 43.5 mmHg (ref 32.0–48.0)
pH, Arterial: 7.364 (ref 7.350–7.450)
pH, Arterial: 7.478 — ABNORMAL HIGH (ref 7.350–7.450)
pH, Arterial: 7.46 — ABNORMAL HIGH (ref 7.350–7.450)
pH, Arterial: 7.399 (ref 7.350–7.450)
pH, Arterial: 7.425 (ref 7.350–7.450)
pH, Arterial: 7.394 (ref 7.350–7.450)
pH, Arterial: 7.347 — ABNORMAL LOW (ref 7.350–7.450)
pH, Arterial: 7.33 — ABNORMAL LOW (ref 7.350–7.450)
pO2, Arterial: 510 mmHg — ABNORMAL HIGH (ref 83.0–108.0)
pO2, Arterial: 386 mmHg — ABNORMAL HIGH (ref 83.0–108.0)
pO2, Arterial: 399 mmHg — ABNORMAL HIGH (ref 83.0–108.0)
pO2, Arterial: 383 mmHg — ABNORMAL HIGH (ref 83.0–108.0)
pO2, Arterial: 436 mmHg — ABNORMAL HIGH (ref 83.0–108.0)
pO2, Arterial: 129 mmHg — ABNORMAL HIGH (ref 83.0–108.0)
pO2, Arterial: 136 mmHg — ABNORMAL HIGH (ref 83.0–108.0)
pO2, Arterial: 135 mmHg — ABNORMAL HIGH (ref 83.0–108.0)

## 2019-10-21 LAB — BASIC METABOLIC PANEL
Anion gap: 6 (ref 5–15)
BUN: 17 mg/dL (ref 8–23)
CO2: 23 mmol/L (ref 22–32)
Calcium: 7.5 mg/dL — ABNORMAL LOW (ref 8.9–10.3)
Chloride: 109 mmol/L (ref 98–111)
Creatinine, Ser: 1.42 mg/dL — ABNORMAL HIGH (ref 0.61–1.24)
GFR calc Af Amer: 60 mL/min — ABNORMAL LOW (ref >60)
GFR calc non Af Amer: 51 mL/min — ABNORMAL LOW (ref >60)
Glucose, Bld: 161 mg/dL — ABNORMAL HIGH (ref 70–99)
Potassium: 4.4 mmol/L (ref 3.5–5.1)
Sodium: 138 mmol/L (ref 135–145)

## 2019-10-21 LAB — POCT I-STAT, CHEM 8
BUN: 16 mg/dL (ref 8–23)
BUN: 13 mg/dL (ref 8–23)
BUN: 15 mg/dL (ref 8–23)
BUN: 15 mg/dL (ref 8–23)
BUN: 14 mg/dL (ref 8–23)
BUN: 14 mg/dL (ref 8–23)
Calcium, Ion: 1.28 mmol/L (ref 1.15–1.40)
Calcium, Ion: 0.96 mmol/L — ABNORMAL LOW (ref 1.15–1.40)
Calcium, Ion: 1.07 mmol/L — ABNORMAL LOW (ref 1.15–1.40)
Calcium, Ion: 1.23 mmol/L (ref 1.15–1.40)
Calcium, Ion: 1.03 mmol/L — ABNORMAL LOW (ref 1.15–1.40)
Calcium, Ion: 0.98 mmol/L — ABNORMAL LOW (ref 1.15–1.40)
Chloride: 101 mmol/L (ref 98–111)
Chloride: 104 mmol/L (ref 98–111)
Chloride: 97 mmol/L — ABNORMAL LOW (ref 98–111)
Chloride: 99 mmol/L (ref 98–111)
Chloride: 98 mmol/L (ref 98–111)
Chloride: 101 mmol/L (ref 98–111)
Creatinine, Ser: 1.3 mg/dL — ABNORMAL HIGH (ref 0.61–1.24)
Creatinine, Ser: 1.1 mg/dL (ref 0.61–1.24)
Creatinine, Ser: 1.1 mg/dL (ref 0.61–1.24)
Creatinine, Ser: 1.2 mg/dL (ref 0.61–1.24)
Creatinine, Ser: 1.1 mg/dL (ref 0.61–1.24)
Creatinine, Ser: 1.1 mg/dL (ref 0.61–1.24)
Glucose, Bld: 98 mg/dL (ref 70–99)
Glucose, Bld: 101 mg/dL — ABNORMAL HIGH (ref 70–99)
Glucose, Bld: 112 mg/dL — ABNORMAL HIGH (ref 70–99)
Glucose, Bld: 94 mg/dL (ref 70–99)
Glucose, Bld: 133 mg/dL — ABNORMAL HIGH (ref 70–99)
Glucose, Bld: 124 mg/dL — ABNORMAL HIGH (ref 70–99)
HCT: 34 % — ABNORMAL LOW (ref 39.0–52.0)
HCT: 25 % — ABNORMAL LOW (ref 39.0–52.0)
HCT: 26 % — ABNORMAL LOW (ref 39.0–52.0)
HCT: 31 % — ABNORMAL LOW (ref 39.0–52.0)
HCT: 24 % — ABNORMAL LOW (ref 39.0–52.0)
HCT: 24 % — ABNORMAL LOW (ref 39.0–52.0)
Hemoglobin: 11.6 g/dL — ABNORMAL LOW (ref 13.0–17.0)
Hemoglobin: 10.5 g/dL — ABNORMAL LOW (ref 13.0–17.0)
Hemoglobin: 8.5 g/dL — ABNORMAL LOW (ref 13.0–17.0)
Hemoglobin: 8.8 g/dL — ABNORMAL LOW (ref 13.0–17.0)
Hemoglobin: 8.2 g/dL — ABNORMAL LOW (ref 13.0–17.0)
Hemoglobin: 8.2 g/dL — ABNORMAL LOW (ref 13.0–17.0)
Potassium: 3.7 mmol/L (ref 3.5–5.1)
Potassium: 4 mmol/L (ref 3.5–5.1)
Potassium: 4.2 mmol/L (ref 3.5–5.1)
Potassium: 4.5 mmol/L (ref 3.5–5.1)
Potassium: 4.5 mmol/L (ref 3.5–5.1)
Potassium: 3.6 mmol/L (ref 3.5–5.1)
Sodium: 139 mmol/L (ref 135–145)
Sodium: 136 mmol/L (ref 135–145)
Sodium: 138 mmol/L (ref 135–145)
Sodium: 140 mmol/L (ref 135–145)
Sodium: 136 mmol/L (ref 135–145)
Sodium: 139 mmol/L (ref 135–145)
TCO2: 32 mmol/L (ref 22–32)
TCO2: 27 mmol/L (ref 22–32)
TCO2: 30 mmol/L (ref 22–32)
TCO2: 31 mmol/L (ref 22–32)
TCO2: 29 mmol/L (ref 22–32)
TCO2: 28 mmol/L (ref 22–32)

## 2019-10-21 LAB — PROTIME-INR
INR: 1.3 — ABNORMAL HIGH (ref 0.8–1.2)
Prothrombin Time: 16.3 seconds — ABNORMAL HIGH (ref 11.4–15.2)

## 2019-10-21 LAB — CBC
HCT: 31.9 % — ABNORMAL LOW (ref 39.0–52.0)
HCT: 30.2 % — ABNORMAL LOW (ref 39.0–52.0)
Hemoglobin: 10.4 g/dL — ABNORMAL LOW (ref 13.0–17.0)
Hemoglobin: 9.8 g/dL — ABNORMAL LOW (ref 13.0–17.0)
MCH: 27.5 pg (ref 26.0–34.0)
MCH: 27.5 pg (ref 26.0–34.0)
MCHC: 32.6 g/dL (ref 30.0–36.0)
MCHC: 32.5 g/dL (ref 30.0–36.0)
MCV: 84.4 fL (ref 80.0–100.0)
MCV: 84.8 fL (ref 80.0–100.0)
Platelets: 181 10*3/uL (ref 150–400)
Platelets: 185 10*3/uL (ref 150–400)
RBC: 3.78 MIL/uL — ABNORMAL LOW (ref 4.22–5.81)
RBC: 3.56 MIL/uL — ABNORMAL LOW (ref 4.22–5.81)
RDW: 12.9 % (ref 11.5–15.5)
RDW: 12.8 % (ref 11.5–15.5)
WBC: 17.6 10*3/uL — ABNORMAL HIGH (ref 4.0–10.5)
WBC: 16.1 10*3/uL — ABNORMAL HIGH (ref 4.0–10.5)
nRBC: 0 % (ref 0.0–0.2)
nRBC: 0 % (ref 0.0–0.2)

## 2019-10-21 LAB — APTT: aPTT: 28 seconds (ref 24–36)

## 2019-10-21 LAB — ECHO INTRAOPERATIVE TEE
Height: 65 in
Weight: 2912 oz

## 2019-10-21 LAB — GLUCOSE, CAPILLARY
Glucose-Capillary: 158 mg/dL — ABNORMAL HIGH (ref 70–99)
Glucose-Capillary: 140 mg/dL — ABNORMAL HIGH (ref 70–99)
Glucose-Capillary: 120 mg/dL — ABNORMAL HIGH (ref 70–99)
Glucose-Capillary: 117 mg/dL — ABNORMAL HIGH (ref 70–99)
Glucose-Capillary: 133 mg/dL — ABNORMAL HIGH (ref 70–99)
Glucose-Capillary: 134 mg/dL — ABNORMAL HIGH (ref 70–99)
Glucose-Capillary: 154 mg/dL — ABNORMAL HIGH (ref 70–99)
Glucose-Capillary: 145 mg/dL — ABNORMAL HIGH (ref 70–99)
Glucose-Capillary: 129 mg/dL — ABNORMAL HIGH (ref 70–99)

## 2019-10-21 LAB — HEMOGLOBIN AND HEMATOCRIT, BLOOD
HCT: 26.8 % — ABNORMAL LOW (ref 39.0–52.0)
Hemoglobin: 9 g/dL — ABNORMAL LOW (ref 13.0–17.0)

## 2019-10-21 LAB — PLATELET COUNT: Platelets: 170 10*3/uL (ref 150–400)

## 2019-10-21 LAB — MAGNESIUM: Magnesium: 3 mg/dL — ABNORMAL HIGH (ref 1.7–2.4)

## 2019-10-21 SURGERY — CORONARY ARTERY BYPASS GRAFTING (CABG)
Anesthesia: General | Site: Chest

## 2019-10-21 MED ORDER — ASPIRIN EC 325 MG PO TBEC
325.0000 mg | DELAYED_RELEASE_TABLET | Freq: Every day | ORAL | Status: DC
  Administered 2019-10-22 – 2019-10-27 (×6): 325 mg via ORAL
  Filled 2019-10-21 (×6): qty 1

## 2019-10-21 MED ORDER — PANTOPRAZOLE SODIUM 40 MG PO TBEC
40.0000 mg | DELAYED_RELEASE_TABLET | Freq: Every day | ORAL | Status: DC
  Administered 2019-10-23 – 2019-10-27 (×5): 40 mg via ORAL
  Filled 2019-10-21 (×5): qty 1

## 2019-10-21 MED ORDER — HEMOSTATIC AGENTS (NO CHARGE) OPTIME
TOPICAL | Status: DC | PRN
  Administered 2019-10-21 (×3): 1 via TOPICAL

## 2019-10-21 MED ORDER — DOCUSATE SODIUM 100 MG PO CAPS
200.0000 mg | ORAL_CAPSULE | Freq: Every day | ORAL | Status: DC
  Administered 2019-10-22 – 2019-10-24 (×3): 200 mg via ORAL
  Filled 2019-10-21 (×3): qty 2

## 2019-10-21 MED ORDER — HEPARIN SODIUM (PORCINE) 1000 UNIT/ML IJ SOLN
INTRAMUSCULAR | Status: DC | PRN
  Administered 2019-10-21: 2000 [IU] via INTRAVENOUS
  Administered 2019-10-21: 28000 [IU] via INTRAVENOUS

## 2019-10-21 MED ORDER — PROPOFOL 10 MG/ML IV BOLUS
INTRAVENOUS | Status: AC
  Filled 2019-10-21: qty 20

## 2019-10-21 MED ORDER — POTASSIUM CHLORIDE 10 MEQ/50ML IV SOLN
10.0000 meq | INTRAVENOUS | Status: AC
  Administered 2019-10-21 (×3): 10 meq via INTRAVENOUS

## 2019-10-21 MED ORDER — PHENYLEPHRINE HCL-NACL 20-0.9 MG/250ML-% IV SOLN
0.0000 ug/min | INTRAVENOUS | Status: DC
  Administered 2019-10-21 – 2019-10-22 (×2): 50 ug/min via INTRAVENOUS
  Filled 2019-10-21 (×2): qty 250

## 2019-10-21 MED ORDER — LACTATED RINGERS IV SOLN: INTRAVENOUS | Status: DC

## 2019-10-21 MED ORDER — FAMOTIDINE IN NACL 20-0.9 MG/50ML-% IV SOLN
20.0000 mg | Freq: Two times a day (BID) | INTRAVENOUS | Status: DC
  Administered 2019-10-21: 20 mg via INTRAVENOUS
  Filled 2019-10-21: qty 50

## 2019-10-21 MED ORDER — SODIUM CHLORIDE 0.9 % IV SOLN: 250.0000 mL | INTRAVENOUS | Status: DC

## 2019-10-21 MED ORDER — DEXTROSE 50 % IV SOLN: 0.0000 mL | INTRAVENOUS | Status: DC | PRN

## 2019-10-21 MED ORDER — FENTANYL CITRATE (PF) 250 MCG/5ML IJ SOLN
INTRAMUSCULAR | Status: DC | PRN
  Administered 2019-10-21: 50 ug via INTRAVENOUS
  Administered 2019-10-21: 400 ug via INTRAVENOUS
  Administered 2019-10-21 (×3): 50 ug via INTRAVENOUS
  Administered 2019-10-21: 100 ug via INTRAVENOUS
  Administered 2019-10-21: 300 ug via INTRAVENOUS

## 2019-10-21 MED ORDER — TRAMADOL HCL 50 MG PO TABS: 50.0000 mg | ORAL_TABLET | ORAL | Status: DC | PRN

## 2019-10-21 MED ORDER — MORPHINE SULFATE (PF) 2 MG/ML IV SOLN
1.0000 mg | INTRAVENOUS | Status: DC | PRN
  Administered 2019-10-21 (×2): 2 mg via INTRAVENOUS
  Administered 2019-10-22 (×2): 4 mg via INTRAVENOUS
  Filled 2019-10-21: qty 1
  Filled 2019-10-21 (×2): qty 2
  Filled 2019-10-21: qty 1

## 2019-10-21 MED ORDER — ACETAMINOPHEN 160 MG/5ML PO SOLN: 1000.0000 mg | Freq: Four times a day (QID) | ORAL | Status: DC

## 2019-10-21 MED ORDER — PHENYLEPHRINE 40 MCG/ML (10ML) SYRINGE FOR IV PUSH (FOR BLOOD PRESSURE SUPPORT)
PREFILLED_SYRINGE | INTRAVENOUS | Status: AC
  Filled 2019-10-21: qty 10

## 2019-10-21 MED ORDER — ORAL CARE MOUTH RINSE
15.0000 mL | Freq: Two times a day (BID) | OROMUCOSAL | Status: DC
  Administered 2019-10-21 – 2019-10-24 (×5): 15 mL via OROMUCOSAL

## 2019-10-21 MED ORDER — PHENYLEPHRINE HCL (PRESSORS) 10 MG/ML IV SOLN
INTRAVENOUS | Status: DC | PRN
  Administered 2019-10-21 (×2): 40 ug via INTRAVENOUS

## 2019-10-21 MED ORDER — VANCOMYCIN HCL IN DEXTROSE 1-5 GM/200ML-% IV SOLN
1000.0000 mg | Freq: Once | INTRAVENOUS | Status: AC
  Administered 2019-10-21: 1000 mg via INTRAVENOUS
  Filled 2019-10-21: qty 200

## 2019-10-21 MED ORDER — METOPROLOL TARTRATE 5 MG/5ML IV SOLN: 2.5000 mg | INTRAVENOUS | Status: DC | PRN

## 2019-10-21 MED ORDER — CHLORHEXIDINE GLUCONATE CLOTH 2 % EX PADS
6.0000 | MEDICATED_PAD | Freq: Every day | CUTANEOUS | Status: DC
  Administered 2019-10-21 – 2019-10-24 (×3): 6 via TOPICAL

## 2019-10-21 MED ORDER — CHLORHEXIDINE GLUCONATE 0.12 % MT SOLN
15.0000 mL | OROMUCOSAL | Status: AC
  Administered 2019-10-21: 15 mL via OROMUCOSAL

## 2019-10-21 MED ORDER — SODIUM CHLORIDE 0.9 % IV SOLN: INTRAVENOUS | Status: DC

## 2019-10-21 MED ORDER — METOPROLOL TARTRATE 12.5 MG HALF TABLET
12.5000 mg | ORAL_TABLET | Freq: Once | ORAL | Status: DC
  Filled 2019-10-21: qty 1

## 2019-10-21 MED ORDER — MIDAZOLAM HCL 2 MG/2ML IJ SOLN: 2.0000 mg | INTRAMUSCULAR | Status: DC | PRN

## 2019-10-21 MED ORDER — SODIUM CHLORIDE 0.9% FLUSH: 10.0000 mL | INTRAVENOUS | Status: DC | PRN

## 2019-10-21 MED ORDER — ALBUMIN HUMAN 5 % IV SOLN: INTRAVENOUS | Status: DC | PRN

## 2019-10-21 MED ORDER — MIDAZOLAM HCL (PF) 10 MG/2ML IJ SOLN
INTRAMUSCULAR | Status: AC
  Filled 2019-10-21: qty 2

## 2019-10-21 MED ORDER — LACTATED RINGERS IV SOLN: 500.0000 mL | Freq: Once | INTRAVENOUS | Status: DC | PRN

## 2019-10-21 MED ORDER — SODIUM CHLORIDE 0.9% FLUSH
10.0000 mL | Freq: Two times a day (BID) | INTRAVENOUS | Status: DC
  Administered 2019-10-21 – 2019-10-22 (×3): 10 mL

## 2019-10-21 MED ORDER — LIDOCAINE 2% (20 MG/ML) 5 ML SYRINGE
INTRAMUSCULAR | Status: AC
  Filled 2019-10-21: qty 5

## 2019-10-21 MED ORDER — LACTATED RINGERS IV SOLN: INTRAVENOUS | Status: DC | PRN

## 2019-10-21 MED ORDER — MAGNESIUM SULFATE 4 GM/100ML IV SOLN
4.0000 g | Freq: Once | INTRAVENOUS | Status: AC
  Administered 2019-10-21: 4 g via INTRAVENOUS
  Filled 2019-10-21: qty 100

## 2019-10-21 MED ORDER — PROTAMINE SULFATE 10 MG/ML IV SOLN
INTRAVENOUS | Status: DC | PRN
  Administered 2019-10-21: 300 mg via INTRAVENOUS

## 2019-10-21 MED ORDER — MIDAZOLAM HCL 5 MG/5ML IJ SOLN
INTRAMUSCULAR | Status: DC | PRN
  Administered 2019-10-21 (×2): 2 mg via INTRAVENOUS
  Administered 2019-10-21: 1 mg via INTRAVENOUS
  Administered 2019-10-21: 3 mg via INTRAVENOUS
  Administered 2019-10-21: 2 mg via INTRAVENOUS

## 2019-10-21 MED ORDER — PROPOFOL 10 MG/ML IV BOLUS
INTRAVENOUS | Status: DC | PRN
  Administered 2019-10-21: 50 mg via INTRAVENOUS

## 2019-10-21 MED ORDER — BISACODYL 10 MG RE SUPP: 10.0000 mg | Freq: Every day | RECTAL | Status: DC

## 2019-10-21 MED ORDER — ACETAMINOPHEN 500 MG PO TABS
1000.0000 mg | ORAL_TABLET | Freq: Four times a day (QID) | ORAL | Status: DC
  Administered 2019-10-21 – 2019-10-24 (×11): 1000 mg via ORAL
  Filled 2019-10-21 (×9): qty 2

## 2019-10-21 MED ORDER — SODIUM CHLORIDE 0.9% FLUSH: 3.0000 mL | INTRAVENOUS | Status: DC | PRN

## 2019-10-21 MED ORDER — ACETAMINOPHEN 650 MG RE SUPP: 650.0000 mg | Freq: Once | RECTAL | Status: AC

## 2019-10-21 MED ORDER — SODIUM CHLORIDE (PF) 0.9 % IJ SOLN
OROMUCOSAL | Status: DC | PRN
  Administered 2019-10-21 (×3): 4 mL via TOPICAL

## 2019-10-21 MED ORDER — CHLORHEXIDINE GLUCONATE 4 % EX LIQD: 30.0000 mL | CUTANEOUS | Status: DC

## 2019-10-21 MED ORDER — SODIUM CHLORIDE 0.9 % IV SOLN
20.0000 ug | INTRAVENOUS | Status: AC
  Administered 2019-10-21: 20 ug via INTRAVENOUS
  Filled 2019-10-21: qty 5

## 2019-10-21 MED ORDER — HEPARIN SODIUM (PORCINE) 1000 UNIT/ML IJ SOLN
INTRAMUSCULAR | Status: AC
  Filled 2019-10-21: qty 1

## 2019-10-21 MED ORDER — ACETAMINOPHEN 160 MG/5ML PO SOLN
650.0000 mg | Freq: Once | ORAL | Status: AC
  Administered 2019-10-21: 650 mg

## 2019-10-21 MED ORDER — METOPROLOL TARTRATE 12.5 MG HALF TABLET
12.5000 mg | ORAL_TABLET | Freq: Two times a day (BID) | ORAL | Status: DC
  Administered 2019-10-22: 12.5 mg via ORAL
  Filled 2019-10-21: qty 1

## 2019-10-21 MED ORDER — ORAL CARE MOUTH RINSE
15.0000 mL | OROMUCOSAL | Status: DC
  Administered 2019-10-21 (×2): 15 mL via OROMUCOSAL

## 2019-10-21 MED ORDER — FENTANYL CITRATE (PF) 250 MCG/5ML IJ SOLN
INTRAMUSCULAR | Status: AC
  Filled 2019-10-21: qty 25

## 2019-10-21 MED ORDER — SODIUM CHLORIDE 0.9 % IV SOLN
1.5000 g | Freq: Two times a day (BID) | INTRAVENOUS | Status: AC
  Administered 2019-10-21 – 2019-10-23 (×4): 1.5 g via INTRAVENOUS
  Filled 2019-10-21 (×4): qty 1.5

## 2019-10-21 MED ORDER — ROCURONIUM BROMIDE 10 MG/ML (PF) SYRINGE
PREFILLED_SYRINGE | INTRAVENOUS | Status: AC
  Filled 2019-10-21: qty 10

## 2019-10-21 MED ORDER — 0.9 % SODIUM CHLORIDE (POUR BTL) OPTIME
TOPICAL | Status: DC | PRN
  Administered 2019-10-21: 09:00:00 5000 mL

## 2019-10-21 MED ORDER — NITROGLYCERIN IN D5W 200-5 MCG/ML-% IV SOLN: 7.0000 ug/min | INTRAVENOUS | Status: DC

## 2019-10-21 MED ORDER — CALCIUM CHLORIDE 10 % IV SOLN
INTRAVENOUS | Status: AC
  Filled 2019-10-21: qty 10

## 2019-10-21 MED ORDER — LACTATED RINGERS IV SOLN
INTRAVENOUS | Status: DC
  Administered 2019-10-22: 21:00:00 900 mL via INTRAVENOUS

## 2019-10-21 MED ORDER — OXYCODONE HCL 5 MG PO TABS
5.0000 mg | ORAL_TABLET | ORAL | Status: DC | PRN
  Administered 2019-10-22 – 2019-10-24 (×14): 10 mg via ORAL
  Administered 2019-10-25: 5 mg via ORAL
  Administered 2019-10-25 (×2): 10 mg via ORAL
  Filled 2019-10-21 (×7): qty 2
  Filled 2019-10-21: qty 1
  Filled 2019-10-21 (×9): qty 2

## 2019-10-21 MED ORDER — SODIUM CHLORIDE 0.9% FLUSH
3.0000 mL | Freq: Two times a day (BID) | INTRAVENOUS | Status: DC
  Administered 2019-10-22 – 2019-10-24 (×4): 3 mL via INTRAVENOUS

## 2019-10-21 MED ORDER — ALBUMIN HUMAN 5 % IV SOLN
250.0000 mL | INTRAVENOUS | Status: DC | PRN
  Administered 2019-10-21 – 2019-10-22 (×3): 12.5 g via INTRAVENOUS
  Filled 2019-10-21: qty 250

## 2019-10-21 MED ORDER — PROTAMINE SULFATE 10 MG/ML IV SOLN
INTRAVENOUS | Status: AC
  Filled 2019-10-21: qty 50

## 2019-10-21 MED ORDER — CHLORHEXIDINE GLUCONATE 0.12 % MT SOLN
15.0000 mL | Freq: Once | OROMUCOSAL | Status: AC
  Administered 2019-10-21: 06:00:00 15 mL via OROMUCOSAL
  Filled 2019-10-21: qty 15

## 2019-10-21 MED ORDER — LIDOCAINE 2% (20 MG/ML) 5 ML SYRINGE
INTRAMUSCULAR | Status: DC | PRN
  Administered 2019-10-21: 100 mg via INTRAVENOUS

## 2019-10-21 MED ORDER — INSULIN REGULAR(HUMAN) IN NACL 100-0.9 UT/100ML-% IV SOLN: INTRAVENOUS | Status: DC

## 2019-10-21 MED ORDER — SODIUM CHLORIDE 0.9 % IV SOLN: INTRAVENOUS | Status: DC | PRN

## 2019-10-21 MED ORDER — METOPROLOL TARTRATE 25 MG/10 ML ORAL SUSPENSION: 12.5000 mg | Freq: Two times a day (BID) | ORAL | Status: DC

## 2019-10-21 MED ORDER — PLASMA-LYTE 148 IV SOLN
INTRAVENOUS | Status: DC | PRN
  Administered 2019-10-21: 500 mL via INTRAVASCULAR

## 2019-10-21 MED ORDER — EPHEDRINE 5 MG/ML INJ
INTRAVENOUS | Status: AC
  Filled 2019-10-21: qty 10

## 2019-10-21 MED ORDER — CHLORHEXIDINE GLUCONATE 0.12% ORAL RINSE (MEDLINE KIT): 15.0000 mL | Freq: Two times a day (BID) | OROMUCOSAL | Status: DC

## 2019-10-21 MED ORDER — MILRINONE LACTATE IN DEXTROSE 20-5 MG/100ML-% IV SOLN
0.1250 ug/kg/min | INTRAVENOUS | Status: DC
  Administered 2019-10-22: 0.25 ug/kg/min via INTRAVENOUS
  Administered 2019-10-22: 0.125 ug/kg/min via INTRAVENOUS
  Filled 2019-10-21 (×3): qty 100

## 2019-10-21 MED ORDER — DEXMEDETOMIDINE HCL IN NACL 400 MCG/100ML IV SOLN
0.0000 ug/kg/h | INTRAVENOUS | Status: DC
  Administered 2019-10-22: 01:00:00 0.1 ug/kg/h via INTRAVENOUS

## 2019-10-21 MED ORDER — ASPIRIN 81 MG PO CHEW: 324.0000 mg | CHEWABLE_TABLET | Freq: Every day | ORAL | Status: DC

## 2019-10-21 MED ORDER — BISACODYL 5 MG PO TBEC
10.0000 mg | DELAYED_RELEASE_TABLET | Freq: Every day | ORAL | Status: DC
  Administered 2019-10-22 – 2019-10-24 (×3): 10 mg via ORAL
  Filled 2019-10-21 (×3): qty 2

## 2019-10-21 MED ORDER — SODIUM CHLORIDE 0.45 % IV SOLN: INTRAVENOUS | Status: DC | PRN

## 2019-10-21 MED ORDER — ONDANSETRON HCL 4 MG/2ML IJ SOLN: 4.0000 mg | Freq: Four times a day (QID) | INTRAMUSCULAR | Status: DC | PRN

## 2019-10-21 MED ORDER — EPINEPHRINE 1 MG/10ML IJ SOSY
PREFILLED_SYRINGE | INTRAMUSCULAR | Status: AC
  Filled 2019-10-21: qty 10

## 2019-10-21 MED ORDER — ROCURONIUM BROMIDE 10 MG/ML (PF) SYRINGE
PREFILLED_SYRINGE | INTRAVENOUS | Status: DC | PRN
  Administered 2019-10-21 (×2): 50 mg via INTRAVENOUS
  Administered 2019-10-21: 100 mg via INTRAVENOUS

## 2019-10-21 SURGICAL SUPPLY — 123 items
ADAPTER CARDIO PERF ANTE/RETRO (ADAPTER) ×5 IMPLANT
ADH SKN CLS LQ APL DERMABOND (GAUZE/BANDAGES/DRESSINGS) ×3
ADPR PRFSN 84XANTGRD RTRGD (ADAPTER) ×3
AGENT HMST 10 BLLW SHRT CANN (HEMOSTASIS) ×3
AGENT HMST KT MTR STRL THRMB (HEMOSTASIS) ×3
APPLIER CLIP 9.375 SM OPEN (CLIP) ×5
APR CLP SM 9.3 20 MLT OPN (CLIP) ×3
BAG DECANTER FOR FLEXI CONT (MISCELLANEOUS) ×5 IMPLANT
BASKET HEART  (ORDER IN 25'S) (MISCELLANEOUS) ×1
BASKET HEART (ORDER IN 25'S) (MISCELLANEOUS) ×4
BASKET HEART (ORDER IN 25S) (MISCELLANEOUS) ×3 IMPLANT
BLADE CLIPPER SURG (BLADE) ×7 IMPLANT
BLADE STERNUM SYSTEM 6 (BLADE) ×5 IMPLANT
BLADE SURG 12 STRL SS (BLADE) ×5 IMPLANT
BLADE SURG 15 STRL LF DISP TIS (BLADE) ×3 IMPLANT
BLADE SURG 15 STRL SS (BLADE) ×10
BNDG ELASTIC 4X5.8 VLCR STR LF (GAUZE/BANDAGES/DRESSINGS) ×7 IMPLANT
BNDG ELASTIC 6X5.8 VLCR STR LF (GAUZE/BANDAGES/DRESSINGS) ×5 IMPLANT
BNDG GAUZE ELAST 4 BULKY (GAUZE/BANDAGES/DRESSINGS) ×7 IMPLANT
CANISTER SUCT 3000ML PPV (MISCELLANEOUS) ×5 IMPLANT
CANNULA GUNDRY RCSP 15FR (MISCELLANEOUS) ×5 IMPLANT
CATH CPB KIT VANTRIGT (MISCELLANEOUS) ×5 IMPLANT
CATH ROBINSON RED A/P 18FR (CATHETERS) ×15 IMPLANT
CATH THORACIC 28FR RT ANG (CATHETERS) ×5 IMPLANT
CLIP APPLIE 9.375 SM OPEN (CLIP) ×3 IMPLANT
CLIP RETRACTION 3.0MM CORONARY (MISCELLANEOUS) ×2 IMPLANT
CLIP VESOCCLUDE MED 24/CT (CLIP) IMPLANT
CLIP VESOCCLUDE SM WIDE 24/CT (CLIP) ×2 IMPLANT
COVER MAYO STAND STRL (DRAPES) ×9 IMPLANT
CUFF TOURN SGL QUICK 18X4 (TOURNIQUET CUFF) ×2 IMPLANT
CUFF TOURN SGL QUICK 24 (TOURNIQUET CUFF)
CUFF TRNQT CYL 24X4X16.5-23 (TOURNIQUET CUFF) IMPLANT
DERMABOND ADHESIVE PROPEN (GAUZE/BANDAGES/DRESSINGS) ×2
DERMABOND ADVANCED .7 DNX6 (GAUZE/BANDAGES/DRESSINGS) IMPLANT
DRAIN CHANNEL 32F RND 10.7 FF (WOUND CARE) ×5 IMPLANT
DRAPE CARDIOVASCULAR INCISE (DRAPES) ×5
DRAPE HALF SHEET 40X57 (DRAPES) ×5 IMPLANT
DRAPE SLUSH/WARMER DISC (DRAPES) ×5 IMPLANT
DRAPE SRG 135X102X78XABS (DRAPES) ×3 IMPLANT
DRSG AQUACEL AG ADV 3.5X14 (GAUZE/BANDAGES/DRESSINGS) ×5 IMPLANT
ELECT BLADE 4.0 EZ CLEAN MEGAD (MISCELLANEOUS) ×5
ELECT BLADE 6.5 EXT (BLADE) ×5 IMPLANT
ELECT CAUTERY BLADE 6.4 (BLADE) ×5 IMPLANT
ELECT REM PT RETURN 9FT ADLT (ELECTROSURGICAL) ×10
ELECTRODE BLDE 4.0 EZ CLN MEGD (MISCELLANEOUS) ×3 IMPLANT
ELECTRODE REM PT RTRN 9FT ADLT (ELECTROSURGICAL) ×6 IMPLANT
FELT TEFLON 1X6 (MISCELLANEOUS) ×8 IMPLANT
GAUZE SPONGE 4X4 12PLY STRL (GAUZE/BANDAGES/DRESSINGS) ×12 IMPLANT
GEL ULTRASOUND 20GR AQUASONIC (MISCELLANEOUS) ×5 IMPLANT
GLOVE BIO SURGEON STRL SZ 6.5 (GLOVE) ×4 IMPLANT
GLOVE BIO SURGEON STRL SZ7.5 (GLOVE) ×15 IMPLANT
GLOVE BIO SURGEONS STRL SZ 6.5 (GLOVE) ×4
GLOVE BIOGEL PI IND STRL 6 (GLOVE) IMPLANT
GLOVE BIOGEL PI INDICATOR 6 (GLOVE) ×6
GOWN STRL REUS W/ TWL LRG LVL3 (GOWN DISPOSABLE) ×12 IMPLANT
GOWN STRL REUS W/TWL LRG LVL3 (GOWN DISPOSABLE) ×65
HEMOSTAT HEMOBLAST BELLOWS (HEMOSTASIS) ×2 IMPLANT
HEMOSTAT POWDER SURGIFOAM 1G (HEMOSTASIS) ×15 IMPLANT
HEMOSTAT SURGICEL 2X14 (HEMOSTASIS) ×5 IMPLANT
INSERT FOGARTY 61MM (MISCELLANEOUS) ×2 IMPLANT
INSERT FOGARTY XLG (MISCELLANEOUS) IMPLANT
KIT BASIN OR (CUSTOM PROCEDURE TRAY) ×5 IMPLANT
KIT SUCTION CATH 14FR (SUCTIONS) ×5 IMPLANT
KIT TURNOVER KIT B (KITS) ×5 IMPLANT
KIT VASOVIEW HEMOPRO 2 VH 4000 (KITS) ×7 IMPLANT
LEAD PACING MYOCARDI (MISCELLANEOUS) ×5 IMPLANT
MARKER GRAFT CORONARY BYPASS (MISCELLANEOUS) ×15 IMPLANT
NS IRRIG 1000ML POUR BTL (IV SOLUTION) ×25 IMPLANT
PACK E OPEN HEART (SUTURE) ×5 IMPLANT
PACK OPEN HEART (CUSTOM PROCEDURE TRAY) ×5 IMPLANT
PAD ARMBOARD 7.5X6 YLW CONV (MISCELLANEOUS) ×10 IMPLANT
PAD ELECT DEFIB RADIOL ZOLL (MISCELLANEOUS) ×5 IMPLANT
PENCIL BUTTON HOLSTER BLD 10FT (ELECTRODE) ×5 IMPLANT
POSITIONER HEAD DONUT 9IN (MISCELLANEOUS) ×5 IMPLANT
POWDER SURGICEL 3.0 GRAM (HEMOSTASIS) ×2 IMPLANT
PUNCH AORTIC ROT 4.0MM RCL 40 (MISCELLANEOUS) ×2 IMPLANT
PUNCH AORTIC ROTATE 4.0MM (MISCELLANEOUS) IMPLANT
PUNCH AORTIC ROTATE 4.5MM 8IN (MISCELLANEOUS) IMPLANT
PUNCH AORTIC ROTATE 5MM 8IN (MISCELLANEOUS) IMPLANT
SET CARDIOPLEGIA MPS 5001102 (MISCELLANEOUS) ×2 IMPLANT
SHEARS HARMONIC 9CM CVD (BLADE) ×5 IMPLANT
SHEARS HARMONIC STRL 23CM (MISCELLANEOUS) IMPLANT
SPONGE LAP 18X18 RF (DISPOSABLE) ×4 IMPLANT
SPONGE LAP 4X18 RFD (DISPOSABLE) ×2 IMPLANT
STAPLER VISISTAT 35W (STAPLE) ×2 IMPLANT
SURGIFLO W/THROMBIN 8M KIT (HEMOSTASIS) ×5 IMPLANT
SUT BONE WAX W31G (SUTURE) ×5 IMPLANT
SUT ETHILON 3 0 FSL (SUTURE) IMPLANT
SUT MNCRL AB 4-0 PS2 18 (SUTURE) IMPLANT
SUT PROLENE 3 0 SH DA (SUTURE) IMPLANT
SUT PROLENE 3 0 SH1 36 (SUTURE) IMPLANT
SUT PROLENE 4 0 RB 1 (SUTURE) ×5
SUT PROLENE 4 0 SH DA (SUTURE) ×7 IMPLANT
SUT PROLENE 4-0 RB1 .5 CRCL 36 (SUTURE) ×3 IMPLANT
SUT PROLENE 5 0 C 1 36 (SUTURE) IMPLANT
SUT PROLENE 6 0 C 1 30 (SUTURE) ×2 IMPLANT
SUT PROLENE 6 0 CC (SUTURE) ×19 IMPLANT
SUT PROLENE 8 0 BV175 6 (SUTURE) ×2 IMPLANT
SUT PROLENE BLUE 7 0 (SUTURE) ×5 IMPLANT
SUT SILK  1 MH (SUTURE)
SUT SILK 1 MH (SUTURE) IMPLANT
SUT SILK 2 0 SH CR/8 (SUTURE) ×2 IMPLANT
SUT SILK 3 0 SH CR/8 (SUTURE) IMPLANT
SUT STEEL 6MS V (SUTURE) ×8 IMPLANT
SUT STEEL SZ 6 DBL 3X14 BALL (SUTURE) ×5 IMPLANT
SUT VIC AB 1 CTX 36 (SUTURE) ×15
SUT VIC AB 1 CTX36XBRD ANBCTR (SUTURE) ×6 IMPLANT
SUT VIC AB 2-0 CT1 27 (SUTURE) ×10
SUT VIC AB 2-0 CT1 TAPERPNT 27 (SUTURE) IMPLANT
SUT VIC AB 2-0 CTX 27 (SUTURE) IMPLANT
SUT VIC AB 3-0 SH 27 (SUTURE)
SUT VIC AB 3-0 SH 27X BRD (SUTURE) IMPLANT
SUT VIC AB 3-0 X1 27 (SUTURE) ×4 IMPLANT
SYR 50ML SLIP (SYRINGE) IMPLANT
SYSTEM SAHARA CHEST DRAIN ATS (WOUND CARE) ×5 IMPLANT
TAPE CLOTH SOFT 2X10 (GAUZE/BANDAGES/DRESSINGS) ×2 IMPLANT
TAPE CLOTH SURG 4X10 WHT LF (GAUZE/BANDAGES/DRESSINGS) ×2 IMPLANT
TOWEL GREEN STERILE (TOWEL DISPOSABLE) ×5 IMPLANT
TOWEL GREEN STERILE FF (TOWEL DISPOSABLE) ×5 IMPLANT
TRAY FOLEY SLVR 16FR TEMP STAT (SET/KITS/TRAYS/PACK) ×5 IMPLANT
TUBING LAP HI FLOW INSUFFLATIO (TUBING) ×7 IMPLANT
UNDERPAD 30X30 (UNDERPADS AND DIAPERS) ×5 IMPLANT
WATER STERILE IRR 1000ML POUR (IV SOLUTION) ×10 IMPLANT

## 2019-10-21 NOTE — Procedures (Signed)
Extubation Procedure Note  Patient Details:   Name: Rodney Young DOB: 1956-04-29 MRN: [id number]   Airway Documentation:    Vent end date: 10/21/19 Vent end time: 1833   Evaluation  O2 sats: stable throughout Complications: No apparent complications Patient did tolerate procedure well. Bilateral Breath Sounds: Clear, Diminished   Yes   RT extubated patient to 4L [place] with RN at bedside. Positive cuff leak noted. RN reported [id number] and NIF -23 to Roxan Hockey, MD. Per MD continue with extubation. Patient tolerated well and is sating 98% on 4L. No stridor noted. RT will continue to monitor as needed.   Vernona Rieger 10/21/2019, 6:34 PM

## 2019-10-21 NOTE — Transfer of Care (Signed)
Immediate Anesthesia Transfer of Care Note  Patient: Rodney Young  Procedure(s) Performed: CORONARY ARTERY BYPASS GRAFTING (CABG) x three, using left internal mammary artery, left radial artery harvested endoscopically and right greater saphenous vein harvested endoscopically (N/A Chest) RIGHT RADIAL ARTERY HARVESTED ENDOSCOPICALLY (Left Arm Lower) TRANSESOPHAGEAL ECHOCARDIOGRAM (TEE) (N/A )  Patient Location: ICU  Anesthesia Type:General  Level of Consciousness: sedated and Patient remains intubated per anesthesia plan  Airway & Oxygen Therapy: Patient remains intubated per anesthesia plan and Patient placed on Ventilator (see vital sign flow sheet for setting)  Post-op Assessment: Report given to RN and Post -op Vital signs reviewed and stable  Post vital signs: Reviewed and stable  Last Vitals:  Vitals Value Taken Time  BP    Temp 35.9 C 10/21/19 1450  Pulse 88 10/21/19 1450  Resp 0 10/21/19 1450  SpO2 98 % 10/21/19 1450  Vitals shown include unvalidated device data.  Last Pain:  Vitals:   10/21/19 [id number]  PainSc: 0-No pain         Complications: No apparent anesthesia complications

## 2019-10-21 NOTE — Progress Notes (Signed)
  Echocardiogram Echocardiogram Transesophageal has been performed.  Bobbye Charleston 10/21/2019, 11:05 AM

## 2019-10-21 NOTE — Progress Notes (Signed)
      [address]       [place] [address]             [phone]      S/p CABG with L radial  Intubated, starting to wake up  BP 97/63   Pulse 88   Temp (!) 96.8 F (36 C)   Resp 13   Ht 5\' 5"  (1.651 m)   Wt 82.6 kg   SpO2 99%   BMI 30.29 kg/m   34/19, CI =1.9   Intake/Output Summary (Last 24 hours) at 10/21/2019 1724 Last data filed at 10/21/2019 1600 Gross per 24 hour  Intake 3979.91 ml  Output 1824 ml  Net 2155.91 ml   Hct= 32, K= 3.8  Doing well early postop Wean to extubate  Remo Lipps C. Roxan Hockey, MD [facility] [phone]

## 2019-10-21 NOTE — Anesthesia Procedure Notes (Signed)
Central Venous Catheter Insertion Performed by: Murvin Natal, MD, anesthesiologist Start/End3/22/2021 6:40 AM, 10/21/2019 7:00 AM Patient location: Pre-op. Preanesthetic checklist: patient identified, IV checked, site marked, risks and benefits discussed, surgical consent, monitors and equipment checked, pre-op evaluation, timeout performed and anesthesia consent Position: Trendelenburg Lidocaine 1% used for infiltration Hand hygiene performed , maximum sterile barriers used  and Seldinger technique used Catheter size: 9 Fr Total catheter length 12. PA cath was placed.MAC introducer Swan type:thermodilution PA Cath depth:50 Procedure performed using ultrasound guided technique. Ultrasound Notes:anatomy identified, needle tip was noted to be adjacent to the nerve/plexus identified and no ultrasound evidence of intravascular and/or intraneural injection Attempts: 1 Following insertion, line sutured, dressing applied and Biopatch. Post procedure assessment: blood return through all ports, free fluid flow and no air  Patient tolerated the procedure well with no immediate complications.

## 2019-10-21 NOTE — H&P (Signed)
PCP is Townsend Roger, MD Referring Provider is No ref. provider found  No chief complaint on file.   HPI: 64 year old nondiabetic non-smoker with symptoms of acute coronary syndrome.  Cardiac catheterization shows severe three-vessel coronary disease with preserved LV function.  No evidence of valvular disease.  Patient has been referred for surgical coronary vascularization.  I have reviewed the coronary arteriograms and he has adequate targets for grafting.  He would benefit from left IMA graft to the LAD, left radial artery graft to the OM and saphenous vein graft to the distal posterior descending.  The patient will be scheduled for surgery on Monday, March 22 at [facility].  I discussed the details of surgery with the patient and his wife including the benefits and risks.  He understands that the surgery will take 4-5 hours and he will be hospitalized for approximately 5 days.  He understands the risk of bleeding, stroke, MI, infection, organ failure.  He agrees to proceed with surgery.   Past Medical History:  Diagnosis Date  . Anxiety   . Cancer [facility])    prostate  . Coronary artery disease   . GERD (gastroesophageal reflux disease)   . Headache   . Hyperlipidemia   . Hypertension     Past Surgical History:  Procedure Laterality Date  . LEFT HEART CATH AND CORONARY ANGIOGRAPHY N/A 10/14/2019   Procedure: LEFT HEART CATH AND CORONARY ANGIOGRAPHY;  Surgeon: Lorretta Harp, MD;  Location: [place] [facility];  Service: Cardiovascular;  Laterality: N/A;  . PROSTATECTOMY      Family History  Problem Relation Age of Onset  . Heart attack Father     Social History Social History   Tobacco Use  . Smoking status: Never Smoker  . Smokeless tobacco: Never Used  Substance Use Topics  . Alcohol use: Not Currently  . Drug use: Never    Current Facility-Administered Medications  Medication Dose Route Frequency Provider Last Rate Last Admin  . cefUROXime (ZINACEF) 1.5 g in  sodium chloride 0.9 % 100 mL IVPB  1.5 g Intravenous To OR Prescott Gum, Collier Salina, MD      . cefUROXime (ZINACEF) 750 mg in sodium chloride 0.9 % 100 mL IVPB  750 mg Intravenous To OR Prescott Gum, Collier Salina, MD      . chlorhexidine (HIBICLENS) 4 % liquid 2 application  30 mL Topical UD Prescott Gum, Collier Salina, MD      . dexmedetomidine (PRECEDEX) 400 MCG/100ML (4 mcg/mL) infusion  0.1-0.7 mcg/kg/hr Intravenous To OR Prescott Gum, Collier Salina, MD      . EPINEPHrine (ADRENALIN) 4 mg in NS 250 mL (0.016 mg/mL) premix infusion  0-10 mcg/min Intravenous To OR Prescott Gum, Collier Salina, MD      . heparin 2,500 Units, papaverine 30 mg in electrolyte-148 (PLASMALYTE-148) 500 mL irrigation   Irrigation To OR Prescott Gum, Collier Salina, MD      . heparin 30,000 units/NS 1000 mL solution for CELLSAVER   Other To OR Prescott Gum, Collier Salina, MD      . insulin regular, human (MYXREDLIN) 100 units/ 100 mL infusion   Intravenous To OR Prescott Gum, Collier Salina, MD      . magnesium sulfate (IV Push/IM) injection 40 mEq  40 mEq Other To OR Prescott Gum, Collier Salina, MD      . metoprolol tartrate (LOPRESSOR) tablet 12.5 mg  12.5 mg Oral Once Prescott Gum, Collier Salina, MD      . milrinone (PRIMACOR) 20 MG/100 ML (0.2 mg/mL) infusion  0.3 mcg/kg/min Intravenous  To OR Prescott Gum, Collier Salina, MD      . nitroGLYCERIN 50 mg in dextrose 5 % 250 mL (0.2 mg/mL) infusion  2-200 mcg/min Intravenous To OR Prescott Gum, Collier Salina, MD      . norepinephrine (LEVOPHED) 4mg  in 224mL premix infusion  0-40 mcg/min Intravenous To OR Prescott Gum, Collier Salina, MD      . phenylephrine (NEOSYNEPHRINE) 20-0.9 MG/250ML-% infusion  30-200 mcg/min Intravenous To OR Prescott Gum, Collier Salina, MD      . potassium chloride injection 80 mEq  80 mEq Other To OR Prescott Gum, Collier Salina, MD      . tranexamic acid (CYKLOKAPRON) 2,500 mg in sodium chloride 0.9 % 250 mL (10 mg/mL) infusion  1.5 mg/kg/hr Intravenous To OR Prescott Gum, Collier Salina, MD      . tranexamic acid (CYKLOKAPRON) bolus via infusion - over 30 minutes 1,231.5 mg  15 mg/kg Intravenous To OR Prescott Gum, Collier Salina,  MD      . tranexamic acid (CYKLOKAPRON) pump prime solution 164 mg  2 mg/kg Intracatheter To OR Prescott Gum, Collier Salina, MD      . vancomycin Alcus Dad) IVPB 1250 mg/250 mL  1,250 mg Intravenous To OR Ivin Poot, MD        Allergies  Allergen Reactions  . Imdur [Isosorbide Nitrate] Other (See Comments)    " headache"    Review of Systems  Right-hand-dominant Patient had robotic total prostatectomy 3 years ago without anesthetic problems or bleeding. Currently has urine leakage and wears a depends pads No ankle edema no history of DVT No history of thoracic trauma pneumothorax or rib fracture No history of cardiac murmur No active dental problems or difficulty swallowing No history of syncope change in vision or TIA He does not smoke and rarely uses alcohol Patient is employed part-time at a [employer] in [employer]  BP 138/65   Pulse (!) 56   Temp 98.2 F (36.8 C)   Resp 18   Ht 5\' 5"  (1.651 m)   Wt 82.6 kg   SpO2 96%   BMI 30.29 kg/m  Physical Exam     Physical Exam  General: Short well-nourished middle-aged male no acute distress accompanied by wife HEENT: Normocephalic pupils equal , dentition adequate Neck: Supple without JVD, adenopathy, or bruit Chest: Clear to auscultation, symmetrical breath sounds, no rhonchi, no tenderness             or deformity Cardiovascular: Regular rate and rhythm, no murmur, no gallop, peripheral pulses             palpable in all extremities Abdomen:  Soft, nontender, no palpable mass or organomegaly Extremities: Warm, well-perfused, no clubbing cyanosis edema or tenderness, no hematoma at cardiac cath site              no venous stasis changes of the legs Rectal/GU: Deferred Neuro: Grossly non--focal and symmetrical throughout Skin: Clean and dry without rash or ulceration   Diagnostic Tests: Coronary angiograms personally reviewed and discussed with patient and wife.  90% stenosis of the LAD, 90% stenosis of the OM and 90% stenosis of  the distal PDA.  LVEDP normal.  Impression: Acute coronary syndrome with severe three-vessel coronary disease preserved LV function Patient was scheduled for CABG at [facility] March 22  Plan: Return for preop testing to be arranged through the office then surgery on Monday, March 22.  He will take his usual a.m. lisinopril dose before leaving the house.   Len Childs, MD [facility] [phone]   Pre Procedure note for inpatients:   Rodney Young has been scheduled for Procedure(s): CORONARY ARTERY BYPASS GRAFTING (CABG) (N/A) RADIAL ARTERY HARVEST (Left) TRANSESOPHAGEAL ECHOCARDIOGRAM (TEE) (N/A) today. The various methods of treatment have been discussed with the patient. After consideration of the risks, benefits and treatment options the patient has consented to the planned procedure.   The patient has been seen and labs reviewed. There are no changes in the patient's condition to prevent proceeding with the planned procedure today.  Recent labs:  Lab Results  Component Value Date   WBC 8.5 10/17/2019   HGB 13.1 10/17/2019   HCT 40.7 10/17/2019   PLT 231 10/17/2019   GLUCOSE 139 (H) 10/17/2019   ALT 22 10/17/2019   AST 19 10/17/2019   NA 135 10/17/2019   K 4.0 10/17/2019   CL 100 10/17/2019   CREATININE 1.63 (H) 10/17/2019   BUN 18 10/17/2019   CO2 23 10/17/2019   INR 1.0 10/17/2019   HGBA1C 6.1 (H) 10/17/2019    Len Childs, MD 10/21/2019 7:29 AM

## 2019-10-21 NOTE — Anesthesia Procedure Notes (Signed)
Arterial Line Insertion Performed by: Valda Favia, CRNA, CRNA  Patient location: Pre-op. Preanesthetic checklist: patient identified, IV checked, site marked, risks and benefits discussed, surgical consent, monitors and equipment checked, pre-op evaluation, timeout performed and anesthesia consent Lidocaine 1% used for infiltration and patient sedated Right, radial was placed Catheter size: 20 G Hand hygiene performed , maximum sterile barriers used  and Seldinger technique used Allen's test indicative of satisfactory collateral circulation Attempts: 1 Procedure performed without using ultrasound guided technique. Following insertion, dressing applied and Biopatch. Post procedure assessment: normal  Patient tolerated the procedure well with no immediate complications.

## 2019-10-21 NOTE — Brief Op Note (Signed)
10/21/2019  7:21 AM  PATIENT:  Rodney Young  64 y.o. male  PRE-OPERATIVE DIAGNOSIS:  Coronary Artery Disease  POST-OPERATIVE DIAGNOSIS:  Coronary Artery Disease  PROCEDURE:  Procedure(s): CORONARY ARTERY BYPASS GRAFTING (CABG) x three, using left internal mammary artery, left radial artery harvested endoscopically and right greater saphenous vein harvested endoscopically (N/A) RIGHT RADIAL ARTERY HARVESTED ENDOSCOPICALLY (Left) TRANSESOPHAGEAL ECHOCARDIOGRAM (TEE) (N/A)  SURGEON:  Surgeon(s) and Role:    Ivin Poot, MD - Primary  PHYSICIAN ASSISTANT: Dickie Labarre PA-C  ANESTHESIA:   general  EBL:  410 mL   BLOOD ADMINISTERED:none  DRAINS: LEFT PLEURAL AND MEDIASTINAL CHEST TUBES   LOCAL MEDICATIONS USED:  NONE  SPECIMEN:  No Specimen  DISPOSITION OF SPECIMEN:  N/A  COUNTS:  YES  TOURNIQUET:   Total Tourniquet Time Documented: Upper Arm (Left) - 38 minutes Total: Upper Arm (Left) - 38 minutes   DICTATION: .Other Dictation: Dictation Number PENDING  PLAN OF CARE: Admit to inpatient   PATIENT DISPOSITION:  ICU - intubated and hemodynamically stable.   Delay start of Pharmacological VTE agent (>24hrs) due to surgical blood loss or risk of bleeding: yes  COMPLICATIONS: NO KNOWN

## 2019-10-21 NOTE — Anesthesia Procedure Notes (Signed)
Procedure Name: Intubation Date/Time: 10/21/2019 8:09 AM Performed by: Valda Favia, CRNA Pre-anesthesia Checklist: Patient identified, Emergency Drugs available, Suction available and Patient being monitored Patient Re-evaluated:Patient Re-evaluated prior to induction Oxygen Delivery Method: Circle System Utilized Preoxygenation: Pre-oxygenation with 100% oxygen Induction Type: IV induction Ventilation: Mask ventilation without difficulty and Oral airway inserted - appropriate to patient size Laryngoscope Size: Mac and 4 Grade View: Grade II Tube type: Oral Tube size: 8.0 mm Number of attempts: 1 Airway Equipment and Method: Stylet and Oral airway Placement Confirmation: ETT inserted through vocal cords under direct vision,  positive ETCO2 and breath sounds checked- equal and bilateral Secured at: 23 cm Tube secured with: Tape Dental Injury: Teeth and Oropharynx as per pre-operative assessment

## 2019-10-22 ENCOUNTER — Inpatient Hospital Stay (HOSPITAL_COMMUNITY): Payer: PPO

## 2019-10-22 ENCOUNTER — Encounter: Payer: Self-pay | Admitting: *Deleted

## 2019-10-22 LAB — GLUCOSE, CAPILLARY
Glucose-Capillary: 115 mg/dL — ABNORMAL HIGH (ref 70–99)
Glucose-Capillary: 117 mg/dL — ABNORMAL HIGH (ref 70–99)
Glucose-Capillary: 119 mg/dL — ABNORMAL HIGH (ref 70–99)
Glucose-Capillary: 119 mg/dL — ABNORMAL HIGH (ref 70–99)
Glucose-Capillary: 125 mg/dL — ABNORMAL HIGH (ref 70–99)
Glucose-Capillary: 131 mg/dL — ABNORMAL HIGH (ref 70–99)
Glucose-Capillary: 133 mg/dL — ABNORMAL HIGH (ref 70–99)
Glucose-Capillary: 134 mg/dL — ABNORMAL HIGH (ref 70–99)
Glucose-Capillary: 135 mg/dL — ABNORMAL HIGH (ref 70–99)

## 2019-10-22 LAB — BASIC METABOLIC PANEL
Anion gap: 7 (ref 5–15)
Anion gap: 8 (ref 5–15)
BUN: 17 mg/dL (ref 8–23)
BUN: 19 mg/dL (ref 8–23)
CO2: 22 mmol/L (ref 22–32)
CO2: 25 mmol/L (ref 22–32)
Calcium: 7.5 mg/dL — ABNORMAL LOW (ref 8.9–10.3)
Calcium: 7.7 mg/dL — ABNORMAL LOW (ref 8.9–10.3)
Chloride: 108 mmol/L (ref 98–111)
Chloride: 106 mmol/L (ref 98–111)
Creatinine, Ser: 1.53 mg/dL — ABNORMAL HIGH (ref 0.61–1.24)
Creatinine, Ser: 1.61 mg/dL — ABNORMAL HIGH (ref 0.61–1.24)
GFR calc Af Amer: 54 mL/min — ABNORMAL LOW (ref >60)
GFR calc Af Amer: 51 mL/min — ABNORMAL LOW (ref >60)
GFR calc non Af Amer: 47 mL/min — ABNORMAL LOW (ref >60)
GFR calc non Af Amer: 44 mL/min — ABNORMAL LOW (ref >60)
Glucose, Bld: 136 mg/dL — ABNORMAL HIGH (ref 70–99)
Glucose, Bld: 128 mg/dL — ABNORMAL HIGH (ref 70–99)
Potassium: 4.2 mmol/L (ref 3.5–5.1)
Potassium: 4.1 mmol/L (ref 3.5–5.1)
Sodium: 137 mmol/L (ref 135–145)
Sodium: 139 mmol/L (ref 135–145)

## 2019-10-22 LAB — CBC
HCT: 27.9 % — ABNORMAL LOW (ref 39.0–52.0)
HCT: 27.1 % — ABNORMAL LOW (ref 39.0–52.0)
Hemoglobin: 9 g/dL — ABNORMAL LOW (ref 13.0–17.0)
Hemoglobin: 8.4 g/dL — ABNORMAL LOW (ref 13.0–17.0)
MCH: 27.7 pg (ref 26.0–34.0)
MCH: 27 pg (ref 26.0–34.0)
MCHC: 32.3 g/dL (ref 30.0–36.0)
MCHC: 31 g/dL (ref 30.0–36.0)
MCV: 85.8 fL (ref 80.0–100.0)
MCV: 87.1 fL (ref 80.0–100.0)
Platelets: 177 10*3/uL (ref 150–400)
Platelets: 147 10*3/uL — ABNORMAL LOW (ref 150–400)
RBC: 3.25 MIL/uL — ABNORMAL LOW (ref 4.22–5.81)
RBC: 3.11 MIL/uL — ABNORMAL LOW (ref 4.22–5.81)
RDW: 13 % (ref 11.5–15.5)
RDW: 13.6 % (ref 11.5–15.5)
WBC: 14.9 10*3/uL — ABNORMAL HIGH (ref 4.0–10.5)
WBC: 14.3 10*3/uL — ABNORMAL HIGH (ref 4.0–10.5)
nRBC: 0 % (ref 0.0–0.2)
nRBC: 0 % (ref 0.0–0.2)

## 2019-10-22 LAB — MAGNESIUM
Magnesium: 2.4 mg/dL (ref 1.7–2.4)
Magnesium: 2.8 mg/dL — ABNORMAL HIGH (ref 1.7–2.4)

## 2019-10-22 MED ORDER — INSULIN ASPART 100 UNIT/ML ~~LOC~~ SOLN: 0.0000 [IU] | Freq: Every day | SUBCUTANEOUS | Status: DC

## 2019-10-22 MED ORDER — ISOSORBIDE MONONITRATE ER 30 MG PO TB24
15.0000 mg | ORAL_TABLET | Freq: Every day | ORAL | Status: DC
  Administered 2019-10-22 – 2019-10-27 (×6): 15 mg via ORAL
  Filled 2019-10-22 (×6): qty 1

## 2019-10-22 MED ORDER — ATORVASTATIN CALCIUM 10 MG PO TABS
20.0000 mg | ORAL_TABLET | Freq: Every day | ORAL | Status: DC
  Administered 2019-10-22 – 2019-10-26 (×5): 20 mg via ORAL
  Filled 2019-10-22 (×5): qty 2

## 2019-10-22 MED ORDER — FUROSEMIDE 10 MG/ML IJ SOLN
20.0000 mg | Freq: Two times a day (BID) | INTRAMUSCULAR | Status: DC
  Administered 2019-10-22 – 2019-10-23 (×3): 20 mg via INTRAVENOUS
  Filled 2019-10-22 (×3): qty 2

## 2019-10-22 MED ORDER — MORPHINE SULFATE (PF) 2 MG/ML IV SOLN
2.0000 mg | INTRAVENOUS | Status: DC | PRN
  Administered 2019-10-22 – 2019-10-23 (×2): 2 mg via INTRAVENOUS
  Filled 2019-10-22 (×2): qty 1

## 2019-10-22 MED ORDER — ALBUMIN HUMAN 5 % IV SOLN
12.5000 g | Freq: Once | INTRAVENOUS | Status: AC
  Administered 2019-10-22: 18:00:00 12.5 g via INTRAVENOUS
  Filled 2019-10-22: qty 250

## 2019-10-22 MED ORDER — ALPRAZOLAM 0.5 MG PO TABS
0.5000 mg | ORAL_TABLET | Freq: Two times a day (BID) | ORAL | Status: DC | PRN
  Administered 2019-10-22 – 2019-10-26 (×4): 0.5 mg via ORAL
  Filled 2019-10-22 (×4): qty 1

## 2019-10-22 MED ORDER — METOPROLOL TARTRATE 25 MG/10 ML ORAL SUSPENSION
12.5000 mg | Freq: Two times a day (BID) | ORAL | Status: DC
  Filled 2019-10-22 (×7): qty 5

## 2019-10-22 MED ORDER — INSULIN ASPART 100 UNIT/ML ~~LOC~~ SOLN
0.0000 [IU] | Freq: Three times a day (TID) | SUBCUTANEOUS | Status: DC
  Administered 2019-10-22 – 2019-10-23 (×2): 2 [IU] via SUBCUTANEOUS
  Administered 2019-10-23: 3 [IU] via SUBCUTANEOUS
  Administered 2019-10-24: 5 [IU] via SUBCUTANEOUS
  Administered 2019-10-24 (×2): 2 [IU] via SUBCUTANEOUS
  Administered 2019-10-25: 3 [IU] via SUBCUTANEOUS

## 2019-10-22 MED ORDER — SIMETHICONE 80 MG PO CHEW
80.0000 mg | CHEWABLE_TABLET | Freq: Four times a day (QID) | ORAL | Status: DC
  Administered 2019-10-22 – 2019-10-27 (×18): 80 mg via ORAL
  Filled 2019-10-22 (×19): qty 1

## 2019-10-22 MED ORDER — METOPROLOL TARTRATE 12.5 MG HALF TABLET
12.5000 mg | ORAL_TABLET | Freq: Two times a day (BID) | ORAL | Status: DC
  Administered 2019-10-23 – 2019-10-27 (×9): 12.5 mg via ORAL
  Filled 2019-10-22 (×9): qty 1

## 2019-10-22 NOTE — Op Note (Signed)
Rodney Young, Rodney Young MEDICAL RECORD [id number] ACCOUNT [id number] DATE OF BIRTH:1956/02/03 FACILITY: [facility] LOCATION: [facility] PHYSICIAN:Whisper Kurka VAN TRIGT III, MD  OPERATIVE REPORT  DATE OF PROCEDURE:  10/21/2019  SURGEON:  Ivin Poot, MD  ASSISTANT:  Jadene Pierini, PA-C  OPERATION: 1.  Coronary artery bypass grafting using left internal mammary artery to left anterior descending, free left radial artery graft to circumflex marginal, and saphenous vein graft to posterior descending. 2.  Endoscopic harvest of left radial artery. 3.  Endoscopic harvest of right leg greater saphenous vein.  ANESTHESIA:  General by Dr. Tedra Senegal.  CLINICAL NOTE:  The patient is a 64 year old, nondiabetic, nonsmoker with hypertension and hyperlipidemia who presents with symptoms of unstable angina.  He was evaluated by Dr. Margaretann Loveless and recommended cardiac catheterization.  This was performed by Dr.  Quay Burow, which demonstrated severe multivessel coronary artery disease with anatomy favorable to surgical revascularization.  I saw the patient in consultation after reviewing his coronary angiograms and discussed the procedure of CABG for  treatment of his 3-vessel CAD.  We discussed the major details of the operation including the use of general anesthesia and cardiopulmonary bypass, the location of the surgical incisions, and the expected postoperative hospital recovery.  We discussed  the expected benefits of relief of angina, preservation of LV function, and improved survival.  We discussed the potential risks including the risk of stroke, bleeding, infection, organ failure, arrhythmia, postoperative pulmonary problems including  pleural effusion, and death.  After reviewing these issues, he demonstrated his understanding and agreed to proceed with surgery under what I felt was informed consent.  FINDINGS:   1.  Diffusely diseased coronary targets, small and graftable, but not optimal. 2.   Adequate conduit using mammary artery, radial artery and saphenous vein. 3.  No blood product transfusions were required.  DESCRIPTION OF PROCEDURE:  The patient was brought to the operating room and placed supine on the operating table after general anesthesia was induced under invasive hemodynamic monitoring.  The patient was then prepped and draped as a sterile field.  A  transesophageal echo probe was placed by the anesthesia team.  The patient remained hemodynamically stable.  Left arm was prepped into the field as well.  First incisions were made in the left arm for endoscopic radial artery harvest and the sternal  incision.  The left radial artery was harvested as a free graft after confirming that there was good palmar arch collateral flow with occlusion of the radial artery.  The left internal mammary artery was harvested as a pedicle graft from its origin at  the subclavian vessels.  It was 1.5 mm vessel with good flow.  The sternal retractor was placed.  After the radial artery was harvested the left arm was sterilely wrapped and placed at the patient's left side.  The right saphenous vein was then harvested endoscopically.  Pursestrings were placed in the ascending aorta and right atrium and after the vein was harvested, the patient was heparinized.  The patient was then cannulated and placed on cardiopulmonary bypass.  The coronaries were identified for grafting.  The LAD  was small and diffusely diseased.  The posterior descending was small and diffusely diseased.  The circumflex was diffusely diseased and somewhat larger in diameter.  Cardioplegia cannulas were placed both antegrade and retrograde cold blood cardioplegia, and the patient was cooled to 32 degrees.  The aortic crossclamp was applied, and 1 liter of cardioplegia was delivered in split doses between the antegrade aortic  and retrograde coronary sinus catheters.  There was good cardioplegic arrest and subtle temperature  dropped less than 14 degrees.  Cardioplegia was delivered every 20 minutes.  The distal coronary anastomoses were performed.  The 1st distal anastomosis was to the posterior descending branch of right coronary.  This was a 1.4 mm vessel, proximal 80% stenosis.  Reverse saphenous vein was sewn end-to-side with running 7-0 Prolene  with good flow through the graft.  Cardioplegia was redosed.  The 2nd distal anastomosis was the OM to branch of the circumflex.  This was a 1.5 mm vessel, proximal 80% to 90% stenosis.  The left radial artery was then sewn end-to-side with running 8-0 Prolene with good flow through the graft.  Cardioplegia was  redosed.  The 3rd distal anastomosis was of the distal 3rd to the LAD.  It was 1.4 mm vessel, proximal 90% stenosis.  The left IMA pedicle was brought through an opening in the left lateral pericardium and was brought down onto the LAD and sewn end-to-side with  running 8-0 Prolene.  There was good flow through the anastomosis after briefly releasing the pedicle bulldog and the mammary artery.  The bulldog was reapplied and the pedicle was secured to the epicardium with 6-0 Prolene sutures.  Cardioplegia was  redosed.  While the cross clamp was still in place, 2 proximal anastomoses were made for both the radial artery and the saphenous vein.  A 4.0 mm punch was used.  The proximal radial artery anastomosis was constructed with a running 7-0 Prolene.  The proximal  aortic anastomosis for the vein was constructed with a running 6-0 Prolene.  Prior to tying down the final proximal anastomosis, air was vented from the coronaries with a dose of retrograde warm blood cardioplegia.  The crossclamp was removed.  The heart resumed a spontaneous rhythm.  The vein graft was deaired and opened.  Hemostasis was documented at the proximal and distal sites of the vein, radial artery, and mammary artery grafts.  Temporary pacing wires were applied.  The patient was  rewarmed and  reperfused.  The lungs were expanded and the ventilator was resumed.  The patient was then weaned from cardiopulmonary bypass on low-dose milrinone.  Echo showed preserved LV systolic function.  Cardiac output was normal.  The patient was given protamine without adverse reaction.  The cannulas were then removed.  The patient was observed and remained stable.  The superior pericardial fat was closed over the aorta and vein grafts.  The anterior mediastinum and left pleural  chest tubes were placed and brought out through separate incisions.  The sternum was closed with wire.  The patient remained stable.  The pectoralis fascia was closed with a running #1 Vicryl.  Subcutaneous and skin layers were closed with running Vicryl  and sterile dressings were applied.  Total cardiopulmonary bypass time was 121 minutes.  CN/NUANCE  D:10/22/2019 T:10/22/2019 JOB:[id number]

## 2019-10-22 NOTE — Research (Addendum)
[place] Study  Visit 2  Visit 2 Pre Surgery labs collected @ [phone] Visit 2 Pre Surgery urine collected @ 0815 Visit 2 Post Surgery urine collected @ 1600  Nephro check #1 0.07 @ 1535 Nephro check #2 0.13 @ 1745 Nephro check #3 0.09 @ 1910 Nephro check # 4 0.16 @ 1952  Patient will be in Observational Arm of study.           ABNORMAL LABS   []    No change or change not clinically significant. NO FOLLOW UP REQUIRED. [x]    Change clinically significant and attributable to disease or management. NO FOLLOW UP  REQUIRED. []    Change clinically significant and possibly attributable to study medication. NO FOLLOW UP REQUIRED. []    Change clinically significant and attributable to study medication. FOLLOW UP REQUIRED. []    Apparent lab error. []    Unevaluable.

## 2019-10-22 NOTE — Research (Signed)
[place] Research Study  Observational Arm  Visit 3 urine collected.

## 2019-10-22 NOTE — Progress Notes (Signed)
Patient ID: Rodney Young, male   DOB: [birth year], 64 y.o.   MRN: [id number] TCTS Evening Rounds:  Hemodynamically stable in atrial paced rhythm 86.  Milrinone 0.125 and neo 10.  UO ok.  Up in chair.  CBC    Component Value Date/Time   WBC 14.3 (H) 10/22/2019 1554   RBC 3.11 (L) 10/22/2019 1554   HGB 8.4 (L) 10/22/2019 1554   HGB CANCELED 10/10/2019 1041   HCT 27.1 (L) 10/22/2019 1554   HCT CANCELED 10/10/2019 1041   PLT 147 (L) 10/22/2019 1554   PLT CANCELED 10/10/2019 1041   MCV 87.1 10/22/2019 1554   MCH 27.0 10/22/2019 1554   MCHC 31.0 10/22/2019 1554   RDW 13.6 10/22/2019 1554   BMET pending this pm.

## 2019-10-22 NOTE — Plan of Care (Signed)

## 2019-10-22 NOTE — Progress Notes (Signed)
1 Day Post-Op Procedure(s) (LRB): CORONARY ARTERY BYPASS GRAFTING (CABG) x three, using left internal mammary artery, left radial artery harvested endoscopically and right greater saphenous vein harvested endoscopically (N/A) RIGHT RADIAL ARTERY HARVESTED ENDOSCOPICALLY (Left) TRANSESOPHAGEAL ECHOCARDIOGRAM (TEE) (N/A) Chest soreness Moves L hand  Objective: Vital signs in last 24 hours: Temp:  [96.6 F (35.9 C)-99.5 F (37.5 C)] 99 F (37.2 C) (03/23 0700) Pulse Rate:  [70-90] 88 (03/23 0700) Cardiac Rhythm: Normal sinus rhythm (03/23 0000) Resp:  [0-29] 23 (03/23 0700) BP: (83-124)/(60-80) 115/65 (03/23 0700) SpO2:  [95 %-100 %] 98 % (03/23 0700) Arterial Line BP: (87-143)/(41-71) 141/54 (03/23 0700) FiO2 (%):  [50 %] 50 % (03/22 1440) Weight:  [86.9 kg] 86.9 kg (03/23 0500)  Hemodynamic parameters for last 24 hours: PAP: (26-42)/(2-21) 30/14 CO:  [3.6 L/min-6 L/min] 5.5 L/min CI:  [1.9 L/min/m2-3.1 L/min/m2] 2.9 L/min/m2  Intake/Output from previous day: 03/22 0701 - 03/23 0700 In: 5360.4 [I.V.:3792.9; Blood:246; IV Piggyback:1321.5] Out: 2769 [Urine:1905; Blood:410; Chest Tube:454] Intake/Output this shift: Total I/O In: -  Out: 150 [Urine:100; Chest Tube:50]       Exam    General- alert and comfortable    Neck- no JVD, no cervical adenopathy palpable, no carotid bruit   Lungs- clear without rales, wheezes   Cor- regular rate and rhythm, no murmur , gallop   Abdomen- soft, non-tender   Extremities - warm, non-tender, minimal edema   Neuro- oriented, appropriate, no focal weakness   Lab Results: Recent Labs    10/21/19 2057 10/22/19 0401  WBC 16.1* 14.9*  HGB 9.8* 9.0*  HCT 30.2* 27.9*  PLT 185 177   BMET:  Recent Labs    10/21/19 2057 10/22/19 0401  NA 138 137  K 4.4 4.2  CL 109 108  CO2 23 22  GLUCOSE 161* 136*  BUN 17 17  CREATININE 1.42* 1.53*  CALCIUM 7.5* 7.5*    PT/INR:  Recent Labs    10/21/19 1431  LABPROT 16.3*  INR 1.3*    ABG    Component Value Date/Time   PHART 7.330 (L) 10/21/2019 1951   HCO3 23.0 10/21/2019 1951   TCO2 24 10/21/2019 1951   ACIDBASEDEF 3.0 (H) 10/21/2019 1951   O2SAT 99.0 10/21/2019 1951   CBG (last 3)  Recent Labs    10/22/19 0227 10/22/19 0358 10/22/19 0611  GLUCAP 125* 133* 131*    Assessment/Plan: S/P Procedure(s) (LRB): CORONARY ARTERY BYPASS GRAFTING (CABG) x three, using left internal mammary artery, left radial artery harvested endoscopically and right greater saphenous vein harvested endoscopically (N/A) RIGHT RADIAL ARTERY HARVESTED ENDOSCOPICALLY (Left) TRANSESOPHAGEAL ECHOCARDIOGRAM (TEE) (N/A) Mobilize Diuresis d/c tubes/lines start Imdur for radial artery graft   LOS: 1 day    Rodney Young 10/22/2019

## 2019-10-22 NOTE — Anesthesia Postprocedure Evaluation (Signed)
Anesthesia Post Note  Patient: Rodney Young  Procedure(s) Performed: CORONARY ARTERY BYPASS GRAFTING (CABG) x three, using left internal mammary artery, left radial artery harvested endoscopically and right greater saphenous vein harvested endoscopically (N/A Chest) RIGHT RADIAL ARTERY HARVESTED ENDOSCOPICALLY (Left Arm Lower) TRANSESOPHAGEAL ECHOCARDIOGRAM (TEE) (N/A )     Patient location during evaluation: SICU Anesthesia Type: General Level of consciousness: awake and alert Pain management: pain level controlled Vital Signs Assessment: post-procedure vital signs reviewed and stable Respiratory status: spontaneous breathing, nonlabored ventilation, respiratory function stable and patient connected to nasal cannula oxygen Cardiovascular status: blood pressure returned to baseline and stable Postop Assessment: no apparent nausea or vomiting Anesthetic complications: no    Last Vitals:  Vitals:   10/22/19 1100 10/22/19 1136  BP: 105/60   Pulse: 86   Resp: (!) 21   Temp:  36.6 C  SpO2: 97%     Last Pain:  Vitals:   10/22/19 1139  TempSrc:   PainSc: 4                  Faruq Rosenberger P Tayia Stonesifer

## 2019-10-23 ENCOUNTER — Inpatient Hospital Stay (HOSPITAL_COMMUNITY): Payer: PPO

## 2019-10-23 LAB — COMPREHENSIVE METABOLIC PANEL
ALT: 18 U/L (ref 0–44)
AST: 27 U/L (ref 15–41)
Albumin: 3.2 g/dL — ABNORMAL LOW (ref 3.5–5.0)
Alkaline Phosphatase: 28 U/L — ABNORMAL LOW (ref 38–126)
Anion gap: 8 (ref 5–15)
BUN: 23 mg/dL (ref 8–23)
CO2: 25 mmol/L (ref 22–32)
Calcium: 8.1 mg/dL — ABNORMAL LOW (ref 8.9–10.3)
Chloride: 107 mmol/L (ref 98–111)
Creatinine, Ser: 1.85 mg/dL — ABNORMAL HIGH (ref 0.61–1.24)
GFR calc Af Amer: 43 mL/min — ABNORMAL LOW (ref >60)
GFR calc non Af Amer: 37 mL/min — ABNORMAL LOW (ref >60)
Glucose, Bld: 132 mg/dL — ABNORMAL HIGH (ref 70–99)
Potassium: 3.9 mmol/L (ref 3.5–5.1)
Sodium: 140 mmol/L (ref 135–145)
Total Bilirubin: 2.3 mg/dL — ABNORMAL HIGH (ref 0.3–1.2)
Total Protein: 5.2 g/dL — ABNORMAL LOW (ref 6.5–8.1)

## 2019-10-23 LAB — GLUCOSE, CAPILLARY
Glucose-Capillary: 115 mg/dL — ABNORMAL HIGH (ref 70–99)
Glucose-Capillary: 118 mg/dL — ABNORMAL HIGH (ref 70–99)
Glucose-Capillary: 132 mg/dL — ABNORMAL HIGH (ref 70–99)
Glucose-Capillary: 134 mg/dL — ABNORMAL HIGH (ref 70–99)
Glucose-Capillary: 135 mg/dL — ABNORMAL HIGH (ref 70–99)
Glucose-Capillary: 182 mg/dL — ABNORMAL HIGH (ref 70–99)

## 2019-10-23 LAB — CBC
HCT: 25.5 % — ABNORMAL LOW (ref 39.0–52.0)
Hemoglobin: 8 g/dL — ABNORMAL LOW (ref 13.0–17.0)
MCH: 27.7 pg (ref 26.0–34.0)
MCHC: 31.4 g/dL (ref 30.0–36.0)
MCV: 88.2 fL (ref 80.0–100.0)
Platelets: 123 10*3/uL — ABNORMAL LOW (ref 150–400)
RBC: 2.89 MIL/uL — ABNORMAL LOW (ref 4.22–5.81)
RDW: 13.5 % (ref 11.5–15.5)
WBC: 13.7 10*3/uL — ABNORMAL HIGH (ref 4.0–10.5)
nRBC: 0 % (ref 0.0–0.2)

## 2019-10-23 MED ORDER — FUROSEMIDE 10 MG/ML IJ SOLN
20.0000 mg | Freq: Once | INTRAMUSCULAR | Status: AC
  Administered 2019-10-23: 20 mg via INTRAVENOUS
  Filled 2019-10-23: qty 2

## 2019-10-23 MED ORDER — FUROSEMIDE 10 MG/ML IJ SOLN: 40.0000 mg | Freq: Every day | INTRAMUSCULAR | Status: DC

## 2019-10-23 NOTE — Plan of Care (Signed)
  Problem: Education: Goal: Knowledge of General Education information will improve Description: Including pain rating scale, medication(s)/side effects and non-pharmacologic comfort measures Outcome: Progressing   Problem: Clinical Measurements: Goal: Diagnostic test results will improve Outcome: Progressing Goal: Cardiovascular complication will be avoided Outcome: Progressing   Problem: Pain Managment: Goal: General experience of comfort will improve Outcome: Progressing   

## 2019-10-23 NOTE — Discharge Summary (Addendum)
Physician Discharge Summary         [address]       [place] [address]             [phone]      Patient ID: Rodney Young MRN: [id number] DOB/AGE: 66-17-1957 66 y.o.  Admit date: 10/21/2019 Discharge date: 10/27/2019  Admission Diagnoses: Severe coronary artery disease  Discharge Diagnoses:  Active Problems:   S/P CABG x 3  Patient Active Problem List   Diagnosis Date Noted   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   CAD (coronary artery disease) 10/16/2019   Abnormal stress test 10/14/2019   HPI: at time of cardiothoracic surgery evaluation  64 year old nondiabetic non-smoker with symptoms of acute coronary syndrome.  Cardiac catheterization shows severe three-vessel coronary disease with preserved LV function.  No evidence of valvular disease.  Patient has been referred for surgical coronary vascularization.  I have reviewed the coronary arteriograms and he has adequate targets for grafting.  He would benefit from left IMA graft to the LAD, left radial artery graft to the OM and saphenous vein graft to the distal posterior descending.   The patient will be scheduled for surgery on Monday, March 22 at [facility].  I discussed the details of surgery with the patient and his wife including the benefits and risks.  He understands that the surgery will take 4-5 hours and he will be hospitalized for approximately 5 days.  He understands the risk of bleeding, stroke, MI, infection, organ failure.  He agrees to proceed with surgery.   Discharged Condition: good  Hospital Course: The patient was admitted electively and taken to the operating room on 10/21/2019 at which time he underwent the below described procedure.  He tolerated it well and was taken to the surgical intensive care unit in stable condition.  Postoperative hospital course: The patient has progressed nicely.  He was weaned from the ventilator using standard protocols without difficulty.  All routine  lines, monitors and drainage devices were discontinued in the standard fashion.  He does have some postoperative volume overload but is responding to diuretics.  He did have a slight increase in his creatinine.  At the time of discharge his most recent creatinine is  2.05.  He does have an expected acute blood loss anemia which is stabilized.  Most recent hemoglobin/hematocrit are 7.7/24.3.  He does have a postoperative thrombocytopenia which is been monitored.  Most recent platelet count is 173k.  Blood sugars have been under adequate control.  He is not a known diabetic.  Hemoglobin A1c is noted to be 6.1.  He will need careful monitoring of his diet long-term as he may have some degree of insulin resistance.  He has been started on indoor for radial artery harvest.  Incisions are noted to be healing well without evidence of infection.  He is tolerating gradually increasing activities using standard cardiac rehab protocols. We continued to work on weaning oxygen. We discontinued his epicardial pacing wires on 10/25/2019 without issue. Today, he is tolerating room air, his incisions are healing well, he is ambulating with limited assistance, and he is ready for discharge home.   Consults: None  Significant Diagnostic Studies:  CLINICAL DATA:  Shortness of breath   EXAM: CHEST - 2 VIEW   COMPARISON:  October 24, 2019   FINDINGS: Temporary pacemaker wires are attached to the right heart. There is a small left pleural effusion with mild atelectasis in the left mid lung. There is minimal  atelectasis in the lateral right base. No consolidation noted. Heart is borderline enlarged with pulmonary vascularity normal. No adenopathy. Status post coronary artery bypass grafting. No pneumothorax. No bone lesions.   IMPRESSION: No pneumothorax. Small left pleural effusion. Mild left base atelectasis and minimal lateral right base atelectasis. No consolidation. Mild cardiac prominence. Postoperative  changes.     Electronically Signed   By: Lowella Grip III M.D.   On: 10/25/2019 08:28  Treatments: surgery:  OPERATIVE REPORT   DATE OF PROCEDURE:  10/21/2019   SURGEON:  Ivin Poot, MD   ASSISTANT:  Jadene Pierini, PA-C   OPERATION: 1.  Coronary artery bypass grafting using left internal mammary artery to left anterior descending, free left radial artery graft to circumflex marginal, and saphenous vein graft to posterior descending. 2.  Endoscopic harvest of left radial artery. 3.  Endoscopic harvest of right leg greater saphenous vein.   ANESTHESIA:  General by Dr. Tedra Senegal. Discharge Exam: Blood pressure (!) 169/93, pulse 87, temperature 98 F (36.7 C), temperature source Oral, resp. rate (!) 22, height 5\' 5"  (1.651 m), weight 84.4 kg, SpO2 97 %.   General appearance: alert, cooperative and no distress Heart: regular rate and rhythm, S1, S2 normal, no murmur, click, rub or gallop Lungs: clear to auscultation bilaterally Abdomen: soft, non-tender; bowel sounds normal; no masses,  no organomegaly Extremities: extremities normal, atraumatic, no cyanosis or edema Wound: clean and dry   Disposition: Discharge disposition: 01-Home or Self Care       Discharge Instructions     Amb Referral to Cardiac Rehabilitation   Complete by: As directed    Diagnosis: CABG   CABG X ___: 3   After initial evaluation and assessments completed: Virtual Based Care may be provided alone or in conjunction with Phase 2 Cardiac Rehab based on patient barriers.: Yes   Discharge patient   Complete by: As directed    Daughter to be with patient today and tonight until 10pm, girlfriend of patient to assist later tonight into tomorrow and beyond.   Discharge disposition: 01-Home or Self Care   Discharge patient date: 10/27/2019      Allergies as of 10/27/2019       Reactions   Imdur [isosorbide Nitrate] Other (See Comments)   " headache"        Medication List     STOP  taking these medications    amLODipine 5 MG tablet Commonly known as: NORVASC       TAKE these medications    ALPRAZolam 0.5 MG tablet Commonly known as: XANAX Take 0.5 mg by mouth at bedtime.   aspirin 325 MG EC tablet Take 1 tablet (325 mg total) by mouth daily. Start taking on: October 28, 2019 What changed:  medication strength how much to take   atorvastatin 20 MG tablet Commonly known as: LIPITOR Take 20 mg by mouth every other day. Lunch   furosemide 40 MG tablet Commonly known as: LASIX Take 1 tablet (40 mg total) by mouth daily for 3 days. Start taking on: October 28, 2019   isosorbide mononitrate 30 MG 24 hr tablet Commonly known as: IMDUR Take 0.5 tablets (15 mg total) by mouth daily. Start taking on: October 28, 2019 What changed: how much to take   lisinopril-hydrochlorothiazide 20-12.5 MG tablet Commonly known as: ZESTORETIC Take 1 tablet by mouth in the morning and at bedtime.   metoprolol tartrate 25 MG tablet Commonly known as: LOPRESSOR Take 0.5 tablets (12.5 mg total) by  mouth 2 (two) times daily.   omeprazole 40 MG capsule Commonly known as: PRILOSEC Take 40 mg by mouth daily.   oxyCODONE 5 MG immediate release tablet Commonly known as: Oxy IR/ROXICODONE Take 1 tablet (5 mg total) by mouth every 6 (six) hours as needed for severe pain.   potassium chloride 10 MEQ CR capsule Commonly known as: MICRO-K Take 2 capsules (20 mEq total) by mouth daily for 3 days.   vitamin B-12 1000 MCG tablet Commonly known as: CYANOCOBALAMIN Take 1,000 mcg by mouth daily.       Follow-up Information     Lorretta Harp, MD Follow up.   Specialties: Cardiology, Radiology Why: Please see discharge paperwork for 2-week follow-up appointment with cardiology. Contact information: [address] [place] [address] [phone]         Prescott Gum, Collier Salina, MD Follow up.   Specialty: Cardiothoracic Surgery Why: Please see discharge  paperwork for 4-week follow-up with your surgeon.  Please obtain a chest x-ray at [place] 1/2-hour prior to this appointment.  It is in the same office complex on the first floor.  Appointment is on 4/21 at 3:30.  Contact information: [address] [place] [address] [phone]         [facility] [place] Follow up.   Specialty: Cardiothoracic Surgery Why: Please see discharge paperwork for follow-up appointment with the nurse to have staples removed from left arm.  Your suture removal appointment is on 3/31 at 11:15am. Contact information: [place], [place] [phone]        Almyra Deforest, [place] Follow up.   Specialties: Cardiology, Radiology Why: Your appointment is on 4/2 at 9:15am. Please bring your hospital paperwork.  Contact information: [address] [place] [address] [phone]           The patient has been discharged on:   1.Beta Blocker:  Yes [ yes  ]                              No   [   ]                              If No, reason:  2.Ace Inhibitor/ARB: Yes [   ]                                     No  [ no   ]                                     If No, reason:  3.Statin:   Yes [ yes  ]                  No  [   ]                  If No, reason:  4.Ecasa:  Yes  [ yes  ]                  No   [   ]  If No, reason:  Signed: Elgie Collard PA-C 10/27/2019, 2:21 PM   DC instructions have been reviewed with the patient patient examined and medical record reviewed,agree with above note. Tharon Aquas Trigt III 10/28/2019

## 2019-10-23 NOTE — Progress Notes (Signed)
2 Days Post-Op Procedure(s) (LRB): CORONARY ARTERY BYPASS GRAFTING (CABG) x three, using left internal mammary artery, left radial artery harvested endoscopically and right greater saphenous vein harvested endoscopically (N/A) RIGHT RADIAL ARTERY HARVESTED ENDOSCOPICALLY (Left) TRANSESOPHAGEAL ECHOCARDIOGRAM (TEE) (N/A) Subjective: Less incisional pain Maintaining sinus rhythm Creatinine slightly increased from baseline now 1.85 Chest x-ray clear  Objective: Vital signs in last 24 hours: Temp:  [97.3 F (36.3 C)-100.4 F (38 C)] 98.3 F (36.8 C) (03/24 0700) Pulse Rate:  [76-88] 84 (03/24 0800) Cardiac Rhythm: Normal sinus rhythm (03/24 0800) Resp:  [9-29] 17 (03/24 0800) BP: (85-123)/(50-80) 99/63 (03/24 0800) SpO2:  [90 %-98 %] 93 % (03/24 0800) Arterial Line BP: (105-118)/(47-63) 112/47 (03/23 1100) Weight:  [85.9 kg] 85.9 kg (03/24 0500)  Hemodynamic parameters for last 24 hours:    Intake/Output from previous day: 03/23 0701 - 03/24 0700 In: 1195.1 [P.O.:120; I.V.:621.4; IV Piggyback:453.8] Out: 1670 [Urine:1490; Chest Tube:180] Intake/Output this shift: Total I/O In: 180 [P.O.:180] Out: 55 [Urine:55]       Exam    General- alert and comfortable    Neck- no JVD, no cervical adenopathy palpable, no carotid bruit   Lungs- clear without rales, wheezes   Cor- regular rate and rhythm, no murmur , gallop   Abdomen- soft, non-tender   Extremities - warm, non-tender, minimal edema   Neuro- oriented, appropriate, no focal weakness   Lab Results: Recent Labs    10/22/19 1554 10/23/19 0419  WBC 14.3* 13.7*  HGB 8.4* 8.0*  HCT 27.1* 25.5*  PLT 147* 123*   BMET:  Recent Labs    10/22/19 1554 10/23/19 0419  NA 139 140  K 4.1 3.9  CL 106 107  CO2 25 25  GLUCOSE 128* 132*  BUN 19 23  CREATININE 1.61* 1.85*  CALCIUM 7.7* 8.1*    PT/INR:  Recent Labs    10/21/19 1431  LABPROT 16.3*  INR 1.3*   ABG    Component Value Date/Time   PHART 7.330 (L)  10/21/2019 1951   HCO3 23.0 10/21/2019 1951   TCO2 24 10/21/2019 1951   ACIDBASEDEF 3.0 (H) 10/21/2019 1951   O2SAT 99.0 10/21/2019 1951   CBG (last 3)  Recent Labs    10/22/19 2330 10/23/19 0339 10/23/19 0748  GLUCAP 115* 135* 182*    Assessment/Plan: S/P Procedure(s) (LRB): CORONARY ARTERY BYPASS GRAFTING (CABG) x three, using left internal mammary artery, left radial artery harvested endoscopically and right greater saphenous vein harvested endoscopically (N/A) RIGHT RADIAL ARTERY HARVESTED ENDOSCOPICALLY (Left) TRANSESOPHAGEAL ECHOCARDIOGRAM (TEE) (N/A) Mobilize Diuresis d/c tubes/lines Plan for transfer to step-down: see transfer orders   LOS: 2 days    Tharon Aquas Trigt III 10/23/2019

## 2019-10-23 NOTE — Plan of Care (Signed)
Pt is alert and oriented, lungs are clear and diminished, on 2L [place], breathing is even and unlabored. Pt has generalized weakness but is able to move all extremities. Pt has complained of pain throughout the night, PRN medication given. No signs of bleeding noted. Heart rate and BP WNL throughout the night. Call bell is within reach and bed is in lowest position. Problem: Clinical Measurements: Goal: Cardiovascular complication will be avoided Outcome: Progressing   Problem: Coping: Goal: Level of anxiety will decrease Outcome: Progressing   Problem: Elimination: Goal: Will not experience complications related to urinary retention Outcome: Progressing   Problem: Safety: Goal: Ability to remain free from injury will improve Outcome: Progressing

## 2019-10-23 NOTE — Progress Notes (Signed)
CARDIAC REHAB PHASE I   PRE:  Rate/Rhythm: 81 SR    BP: sitting 90/66    SaO2: 97 2L, 93 RA  MODE:  Ambulation: 370 ft   POST:  Rate/Rhythm: 96 SR    BP: sitting 108/56     SaO2: 91 RA, down to 89 RA chair, 91 2L  Mod assist to EOB. Able to rock with min assist to stand. Used RW and gait belt. Slow pace, grunting. Left leg weaker than right, pt sts this is his baseline. Trouble steering RW at times. SaO2 maintained 91 RA during walk however decreased once in recliner. Put back on 2L and slow to increase to 91 2L. Pt exhausted, not wanting chair or IS or food. Left in recliner and encouraged him to use IS later and also ambulate again. 500 mL on IS x2.  [id number]  [place], ACSM 10/23/2019 3:03 PM

## 2019-10-23 NOTE — Progress Notes (Signed)
[facility] BRIEF SICU PROGRESS NOTE  2 Days Post-Op  S/P Procedure(s) (LRB): CORONARY ARTERY BYPASS GRAFTING (CABG) x three, using left internal mammary artery, left radial artery harvested endoscopically and right greater saphenous vein harvested endoscopically (N/A) RIGHT RADIAL ARTERY HARVESTED ENDOSCOPICALLY (Left) TRANSESOPHAGEAL ECHOCARDIOGRAM (TEE) (N/A)   Stable day  Plan: Continue current plan  Rexene Alberts, MD 10/23/2019 6:51 PM

## 2019-10-24 ENCOUNTER — Inpatient Hospital Stay (HOSPITAL_COMMUNITY): Payer: PPO

## 2019-10-24 LAB — COMPREHENSIVE METABOLIC PANEL
ALT: 48 U/L — ABNORMAL HIGH (ref 0–44)
AST: 60 U/L — ABNORMAL HIGH (ref 15–41)
Albumin: 2.8 g/dL — ABNORMAL LOW (ref 3.5–5.0)
Alkaline Phosphatase: 59 U/L (ref 38–126)
Anion gap: 8 (ref 5–15)
BUN: 33 mg/dL — ABNORMAL HIGH (ref 8–23)
CO2: 25 mmol/L (ref 22–32)
Calcium: 8.2 mg/dL — ABNORMAL LOW (ref 8.9–10.3)
Chloride: 107 mmol/L (ref 98–111)
Creatinine, Ser: 2.15 mg/dL — ABNORMAL HIGH (ref 0.61–1.24)
GFR calc Af Amer: 36 mL/min — ABNORMAL LOW (ref >60)
GFR calc non Af Amer: 31 mL/min — ABNORMAL LOW (ref >60)
Glucose, Bld: 119 mg/dL — ABNORMAL HIGH (ref 70–99)
Potassium: 4.1 mmol/L (ref 3.5–5.1)
Sodium: 140 mmol/L (ref 135–145)
Total Bilirubin: 3.2 mg/dL — ABNORMAL HIGH (ref 0.3–1.2)
Total Protein: 5.5 g/dL — ABNORMAL LOW (ref 6.5–8.1)

## 2019-10-24 LAB — GLUCOSE, CAPILLARY
Glucose-Capillary: 237 mg/dL — ABNORMAL HIGH (ref 70–99)
Glucose-Capillary: 126 mg/dL — ABNORMAL HIGH (ref 70–99)
Glucose-Capillary: 194 mg/dL — ABNORMAL HIGH (ref 70–99)
Glucose-Capillary: 121 mg/dL — ABNORMAL HIGH (ref 70–99)

## 2019-10-24 LAB — CBC
HCT: 26.2 % — ABNORMAL LOW (ref 39.0–52.0)
Hemoglobin: 8.1 g/dL — ABNORMAL LOW (ref 13.0–17.0)
MCH: 27.4 pg (ref 26.0–34.0)
MCHC: 30.9 g/dL (ref 30.0–36.0)
MCV: 88.5 fL (ref 80.0–100.0)
Platelets: 131 10*3/uL — ABNORMAL LOW (ref 150–400)
RBC: 2.96 MIL/uL — ABNORMAL LOW (ref 4.22–5.81)
RDW: 13.6 % (ref 11.5–15.5)
WBC: 15.3 10*3/uL — ABNORMAL HIGH (ref 4.0–10.5)
nRBC: 0 % (ref 0.0–0.2)

## 2019-10-24 MED ORDER — SODIUM CHLORIDE 0.9% FLUSH
3.0000 mL | Freq: Two times a day (BID) | INTRAVENOUS | Status: DC
  Administered 2019-10-24 – 2019-10-27 (×6): 3 mL via INTRAVENOUS

## 2019-10-24 MED ORDER — SORBITOL 70 % SOLN
30.0000 mL | Freq: Every day | Status: DC | PRN
  Administered 2019-10-24: 30 mL via ORAL
  Filled 2019-10-24 (×2): qty 30

## 2019-10-24 MED ORDER — SODIUM CHLORIDE 0.9 % IV SOLN: 250.0000 mL | INTRAVENOUS | Status: DC | PRN

## 2019-10-24 MED ORDER — ALBUMIN HUMAN 5 % IV SOLN
12.5000 g | Freq: Once | INTRAVENOUS | Status: AC
  Administered 2019-10-24: 12.5 g via INTRAVENOUS
  Filled 2019-10-24: qty 250

## 2019-10-24 MED ORDER — SODIUM CHLORIDE 0.9% FLUSH: 3.0000 mL | INTRAVENOUS | Status: DC | PRN

## 2019-10-24 MED ORDER — ~~LOC~~ CARDIAC SURGERY, PATIENT & FAMILY EDUCATION: Freq: Once | Status: AC

## 2019-10-24 MED ORDER — TRAMADOL HCL 50 MG PO TABS: 50.0000 mg | ORAL_TABLET | Freq: Four times a day (QID) | ORAL | Status: DC | PRN

## 2019-10-24 MED ORDER — BISACODYL 10 MG RE SUPP: 10.0000 mg | Freq: Every day | RECTAL | Status: DC | PRN

## 2019-10-24 MED ORDER — BISACODYL 5 MG PO TBEC: 10.0000 mg | DELAYED_RELEASE_TABLET | Freq: Every day | ORAL | Status: DC | PRN

## 2019-10-24 MED FILL — Mannitol IV Soln 20%: INTRAVENOUS | Qty: 500 | Status: AC

## 2019-10-24 MED FILL — Lidocaine HCl Local Soln Prefilled Syringe 100 MG/5ML (2%): INTRAMUSCULAR | Qty: 5 | Status: AC

## 2019-10-24 MED FILL — Electrolyte-R (PH 7.4) Solution: INTRAVENOUS | Qty: 5000 | Status: AC

## 2019-10-24 MED FILL — Sodium Chloride IV Soln 0.9%: INTRAVENOUS | Qty: 3000 | Status: AC

## 2019-10-24 MED FILL — Magnesium Sulfate Inj 50%: INTRAMUSCULAR | Qty: 10 | Status: AC

## 2019-10-24 MED FILL — Heparin Sodium (Porcine) Inj 1000 Unit/ML: INTRAMUSCULAR | Qty: 20 | Status: AC

## 2019-10-24 MED FILL — Sodium Bicarbonate IV Soln 8.4%: INTRAVENOUS | Qty: 50 | Status: AC

## 2019-10-24 MED FILL — Heparin Sodium (Porcine) Inj 1000 Unit/ML: INTRAMUSCULAR | Qty: 30 | Status: AC

## 2019-10-24 MED FILL — Potassium Chloride Inj 2 mEq/ML: INTRAVENOUS | Qty: 40 | Status: AC

## 2019-10-24 NOTE — Progress Notes (Signed)
Came to ambulate pt however he sts he is exhausted after bathroom trip, now back in bed. He adamantly refused to ambulate anytime soon, wants to rest till 4. Encouraged IS and ambulating with staff at [place], ACSM 2:10 PM 10/24/2019

## 2019-10-24 NOTE — Progress Notes (Addendum)
[address]       [place], [address]             [phone]      3 Days Post-Op Procedure(s) (LRB): CORONARY ARTERY BYPASS GRAFTING (CABG) x three, using left internal mammary artery, left radial artery harvested endoscopically and right greater saphenous vein harvested endoscopically (N/A) RIGHT RADIAL ARTERY HARVESTED ENDOSCOPICALLY (Left) TRANSESOPHAGEAL ECHOCARDIOGRAM (TEE) (N/A) Subjective: Feeling a little stronger, ambulated this am  Objective: Vital signs in last 24 hours: Temp:  [98.2 F (36.8 C)-99.4 F (37.4 C)] 98.2 F (36.8 C) (03/25 0329) Pulse Rate:  [73-94] 89 (03/25 0500) Cardiac Rhythm: Normal sinus rhythm (03/25 0400) Resp:  [11-27] 18 (03/25 0600) BP: (82-122)/(51-71) 106/66 (03/25 0500) SpO2:  [92 %-97 %] 96 % (03/25 0500) Weight:  [85.7 kg] 85.7 kg (03/25 0500)  Hemodynamic parameters for last 24 hours:    Intake/Output from previous day: 03/24 0701 - 03/25 0700 In: 186.9 [P.O.:180; I.V.:6.9] Out: 632 [Urine:632] Intake/Output this shift: No intake/output data recorded.  General appearance: alert, cooperative, fatigued and no distress Heart: regular rate and rhythm Lungs: min dim in bases Abdomen: some distension, + sparse high pitched BS, nontender Extremities: no edema Wound: incis healing well, left hand N/V intact  Lab Results: Recent Labs    10/23/19 0419 10/24/19 0220  WBC 13.7* 15.3*  HGB 8.0* 8.1*  HCT 25.5* 26.2*  PLT 123* 131*   BMET:  Recent Labs    10/23/19 0419 10/24/19 0220  NA 140 140  K 3.9 4.1  CL 107 107  CO2 25 25  GLUCOSE 132* 119*  BUN 23 33*  CREATININE 1.85* 2.15*  CALCIUM 8.1* 8.2*    PT/INR:  Recent Labs    10/21/19 1431  LABPROT 16.3*  INR 1.3*   ABG    Component Value Date/Time   PHART 7.330 (L) 10/21/2019 1951   HCO3 23.0 10/21/2019 1951   TCO2 24 10/21/2019 1951   ACIDBASEDEF 3.0 (H) 10/21/2019 1951   O2SAT 99.0 10/21/2019 1951   CBG (last 3)  Recent Labs      10/23/19 1548 10/23/19 1925 10/23/19 2339  GLUCAP 132* 134* 115*    Meds Scheduled Meds: . acetaminophen  1,000 mg Oral Q6H   Or  . acetaminophen (TYLENOL) oral liquid 160 mg/5 mL  1,000 mg Per Tube Q6H  . aspirin EC  325 mg Oral Daily   Or  . aspirin  324 mg Per Tube Daily  . atorvastatin  20 mg Oral q1800  . bisacodyl  10 mg Oral Daily   Or  . bisacodyl  10 mg Rectal Daily  . Chlorhexidine Gluconate Cloth  6 each Topical Daily  . docusate sodium  200 mg Oral Daily  . insulin aspart  0-15 Units Subcutaneous TID WC  . insulin aspart  0-5 Units Subcutaneous QHS  . isosorbide mononitrate  15 mg Oral Daily  . mouth rinse  15 mL Mouth Rinse BID  . metoprolol tartrate  12.5 mg Oral BID   Or  . metoprolol tartrate  12.5 mg Per Tube BID  . pantoprazole  40 mg Oral Daily  . simethicone  80 mg Oral QID  . sodium chloride flush  3 mL Intravenous Q12H   Continuous Infusions: . sodium chloride    . sodium chloride    . sodium chloride 10 mL/hr at 10/21/19 1435  . albumin human     PRN Meds:.sodium chloride, ALPRAZolam, ondansetron (ZOFRAN) IV, oxyCODONE,  sodium chloride flush, sorbitol  Xrays DG Chest 2 View  Result Date: 10/24/2019 CLINICAL DATA:  History of CABG.  Shortness of breath. EXAM: CHEST - 2 VIEW COMPARISON:  10/23/2019. FINDINGS: Interim removal of right IJ sheath. Trachea is not deviated on today's exam. Prior CABG. Stable cardiomegaly. Stable bilateral subsegmental atelectasis. Tiny bilateral pleural effusions cannot be excluded. No pneumothorax. IMPRESSION: 1.  Interim removal of right IJ sheath.  No pneumothorax identified. 2.  Prior CABG.  Stable cardiomegaly. 3. Stable bilateral subsegmental atelectasis. Tiny bilateral pleural effusions cannot be excluded. No evidence of tracheal deviation noted on today's exam. Electronically Signed   By: Marcello Moores  Register   On: 10/24/2019 06:47   DG Chest Port 1 View  Result Date: 10/23/2019 CLINICAL DATA:  Sore chest after  open-heart surgery EXAM: PORTABLE CHEST 1 VIEW COMPARISON:  Yesterday FINDINGS: The Swan-Ganz catheter and chest tubes have been removed. No visible pneumothorax. Stable atelectasis. CABG with unchanged cardiomegaly and vascular pedicle widening. Redemonstrated tracheal deviation to the right, likely accentuated from low volumes IMPRESSION: No acute finding after chest tube removal.  Stable atelectasis. Electronically Signed   By: Monte Fantasia M.D.   On: 10/23/2019 09:04    Assessment/Plan: S/P Procedure(s) (LRB): CORONARY ARTERY BYPASS GRAFTING (CABG) x three, using left internal mammary artery, left radial artery harvested endoscopically and right greater saphenous vein harvested endoscopically (N/A) RIGHT RADIAL ARTERY HARVESTED ENDOSCOPICALLY (Left) TRANSESOPHAGEAL ECHOCARDIOGRAM (TEE) (N/A)  1 hemodyn stable on no pressors/inotropes 2 sinus rhythm with occas PVC 3 creat rising trend, will hold diuretic for now 4 leukocytosis trend increasing- prob inflamm response, no fevers 5 CXR- mild atx , minor effus 6 H/H stable 7 thrombocytopenia improving trends 8 stable for tx to 4e  LOS: 3 days    John Giovanni PA-C Pager [phone] 10/24/2019  Follow postop renal function.  Preoperative creatinine 1.7 and postop gradual rise 2.2 patient examined and medical record reviewed,agree with above note. Tharon Aquas Trigt III 10/24/2019

## 2019-10-24 NOTE — Progress Notes (Signed)
MOBILITY TEAM - Progress Note   10/24/19 1700  Mobility  Activity Ambulated in hall  Level of Assistance Standby assist, set-up cues, supervision of patient - no hands on  Assistive Device Front wheel walker  Distance Ambulated (ft) 440 ft  Mobility Response Tolerated fair  Mobility performed by Mobility specialist   SOB while walking; SpO2 92-94% on RA with ambulation; HR up to 114. Cues for standing technique with hands on knees and pt able to stand without physical assist from edge of bed.   Mabeline Caras, PT, DPT Mobility Team Pager [phone]

## 2019-10-25 ENCOUNTER — Encounter (HOSPITAL_COMMUNITY): Payer: Self-pay | Admitting: Cardiothoracic Surgery

## 2019-10-25 ENCOUNTER — Inpatient Hospital Stay (HOSPITAL_COMMUNITY): Payer: PPO

## 2019-10-25 LAB — GLUCOSE, CAPILLARY
Glucose-Capillary: 111 mg/dL — ABNORMAL HIGH (ref 70–99)
Glucose-Capillary: 116 mg/dL — ABNORMAL HIGH (ref 70–99)
Glucose-Capillary: 130 mg/dL — ABNORMAL HIGH (ref 70–99)
Glucose-Capillary: 153 mg/dL — ABNORMAL HIGH (ref 70–99)
Glucose-Capillary: 98 mg/dL (ref 70–99)

## 2019-10-25 LAB — BASIC METABOLIC PANEL
Anion gap: 11 (ref 5–15)
BUN: 44 mg/dL — ABNORMAL HIGH (ref 8–23)
CO2: 23 mmol/L (ref 22–32)
Calcium: 8.4 mg/dL — ABNORMAL LOW (ref 8.9–10.3)
Chloride: 105 mmol/L (ref 98–111)
Creatinine, Ser: 2.05 mg/dL — ABNORMAL HIGH (ref 0.61–1.24)
GFR calc Af Amer: 38 mL/min — ABNORMAL LOW (ref >60)
GFR calc non Af Amer: 33 mL/min — ABNORMAL LOW (ref >60)
Glucose, Bld: 115 mg/dL — ABNORMAL HIGH (ref 70–99)
Potassium: 3.8 mmol/L (ref 3.5–5.1)
Sodium: 139 mmol/L (ref 135–145)

## 2019-10-25 LAB — CBC
HCT: 24.3 % — ABNORMAL LOW (ref 39.0–52.0)
Hemoglobin: 7.7 g/dL — ABNORMAL LOW (ref 13.0–17.0)
MCH: 27.8 pg (ref 26.0–34.0)
MCHC: 31.7 g/dL (ref 30.0–36.0)
MCV: 87.7 fL (ref 80.0–100.0)
Platelets: 173 10*3/uL (ref 150–400)
RBC: 2.77 MIL/uL — ABNORMAL LOW (ref 4.22–5.81)
RDW: 13.7 % (ref 11.5–15.5)
WBC: 12.9 10*3/uL — ABNORMAL HIGH (ref 4.0–10.5)
nRBC: 0 % (ref 0.0–0.2)

## 2019-10-25 MED ORDER — FUROSEMIDE 40 MG PO TABS
40.0000 mg | ORAL_TABLET | Freq: Every day | ORAL | Status: DC
  Administered 2019-10-26 – 2019-10-27 (×2): 40 mg via ORAL
  Filled 2019-10-25 (×2): qty 1

## 2019-10-25 MED ORDER — LEVALBUTEROL HCL 0.63 MG/3ML IN NEBU
0.6300 mg | INHALATION_SOLUTION | Freq: Three times a day (TID) | RESPIRATORY_TRACT | Status: DC
  Administered 2019-10-25 – 2019-10-26 (×3): 0.63 mg via RESPIRATORY_TRACT
  Filled 2019-10-25 (×4): qty 3

## 2019-10-25 NOTE — Progress Notes (Signed)
EPWs pulled order.  Pt tolerated well, sites unremarkable, all tips intact.  VS cycling q59m.  Pt understands bedrest for 1 hr. Call bell in reach.  CCMD notified, will monitor closely.

## 2019-10-25 NOTE — Progress Notes (Addendum)
[address]       [place] [address]             [phone]      4 Days Post-Op Procedure(s) (LRB): CORONARY ARTERY BYPASS GRAFTING (CABG) x three, using left internal mammary artery, left radial artery harvested endoscopically and right greater saphenous vein harvested endoscopically (N/A) RIGHT RADIAL ARTERY HARVESTED ENDOSCOPICALLY (Left) TRANSESOPHAGEAL ECHOCARDIOGRAM (TEE) (N/A) Subjective: Primarily just feels very sore  Objective: Vital signs in last 24 hours: Temp:  [98 F (36.7 C)-99.2 F (37.3 C)] 98.2 F (36.8 C) (03/26 0414) Pulse Rate:  [87-97] 87 (03/26 0414) Cardiac Rhythm: Normal sinus rhythm (03/25 2015) Resp:  [13-29] 13 (03/26 0414) BP: (97-139)/(35-70) 114/65 (03/26 0414) SpO2:  [87 %-97 %] 97 % (03/26 0414) Weight:  [85.5 kg] 85.5 kg (03/26 0414)  Hemodynamic parameters for last 24 hours:    Intake/Output from previous day: 03/25 0701 - 03/26 0700 In: 910.7 [P.O.:720; IV Piggyback:190.7] Out: 95 [Urine:95] Intake/Output this shift: No intake/output data recorded.  General appearance: alert, cooperative and no distress Heart: regular rate and rhythm Lungs: minor exp wheeze Abdomen: mild distension, + BS Extremities: min edema Wound: incis healing well, left hand N/V intact  Lab Results: Recent Labs    10/24/19 0220 10/25/19 0251  WBC 15.3* 12.9*  HGB 8.1* 7.7*  HCT 26.2* 24.3*  PLT 131* 173   BMET:  Recent Labs    10/24/19 0220 10/25/19 0251  NA 140 139  K 4.1 3.8  CL 107 105  CO2 25 23  GLUCOSE 119* 115*  BUN 33* 44*  CREATININE 2.15* 2.05*  CALCIUM 8.2* 8.4*    PT/INR: No results for input(s): LABPROT, INR in the last 72 hours. ABG    Component Value Date/Time   PHART 7.330 (L) 10/21/2019 1951   HCO3 23.0 10/21/2019 1951   TCO2 24 10/21/2019 1951   ACIDBASEDEF 3.0 (H) 10/21/2019 1951   O2SAT 99.0 10/21/2019 1951   CBG (last 3)  Recent Labs    10/24/19 2159 10/25/19 0023 10/25/19 0641    GLUCAP 121* 130* 111*    Meds Scheduled Meds: . aspirin EC  325 mg Oral Daily   Or  . aspirin  324 mg Per Tube Daily  . atorvastatin  20 mg Oral q1800  . insulin aspart  0-15 Units Subcutaneous TID WC  . insulin aspart  0-5 Units Subcutaneous QHS  . isosorbide mononitrate  15 mg Oral Daily  . metoprolol tartrate  12.5 mg Oral BID   Or  . metoprolol tartrate  12.5 mg Per Tube BID  . pantoprazole  40 mg Oral Daily  . simethicone  80 mg Oral QID  . sodium chloride flush  3 mL Intravenous Q12H   Continuous Infusions: . sodium chloride     PRN Meds:.sodium chloride, ALPRAZolam, bisacodyl **OR** bisacodyl, oxyCODONE, sodium chloride flush, sorbitol, traMADol  Xrays DG Chest 2 View  Result Date: 10/24/2019 CLINICAL DATA:  History of CABG.  Shortness of breath. EXAM: CHEST - 2 VIEW COMPARISON:  10/23/2019. FINDINGS: Interim removal of right IJ sheath. Trachea is not deviated on today's exam. Prior CABG. Stable cardiomegaly. Stable bilateral subsegmental atelectasis. Tiny bilateral pleural effusions cannot be excluded. No pneumothorax. IMPRESSION: 1.  Interim removal of right IJ sheath.  No pneumothorax identified. 2.  Prior CABG.  Stable cardiomegaly. 3. Stable bilateral subsegmental atelectasis. Tiny bilateral pleural effusions cannot be excluded. No evidence of tracheal deviation noted on today's exam. Electronically Signed  By: [place]   On: 10/24/2019 06:47    Assessment/Plan: S/P Procedure(s) (LRB): CORONARY ARTERY BYPASS GRAFTING (CABG) x three, using left internal mammary artery, left radial artery harvested endoscopically and right greater saphenous vein harvested endoscopically (N/A) RIGHT RADIAL ARTERY HARVESTED ENDOSCOPICALLY (Left) TRANSESOPHAGEAL ECHOCARDIOGRAM (TEE) (N/A)  1 hemodyn stable in sinus rhythm 2 sats good on 2 liters 3 creat trend improved , 2.05, UOP not measured, may need some diuretic 4 h/h has dropped a bit further, close to transfusion  threshold, monitor closely 5 thrombocytopenia resolved 5  BS mostly controlled- diet controlled at home 6 push pulm toilet/rehab 7 + BM 8 d/c epw's poss home sun-mon if off O2 and creat improved   LOS: 4 days    John Giovanni PA-C pager [phone] 10/25/2019  patient examined and medical record reviewed,agree with above note. Tharon Aquas Trigt III 10/25/2019

## 2019-10-25 NOTE — Progress Notes (Signed)
MOBILITY TEAM - Progress Note   10/25/19 1700  Mobility  Activity Ambulated in hall;Ambulated to bathroom  Level of Assistance Standby assist, set-up cues, supervision of patient - no hands on  Assistive Device Front wheel walker  Distance Ambulated (ft) 500 ft  Mobility Response Tolerated well  Mobility performed by Mobility specialist  Bed Position Chair   Pre-activity: HR 103, BP 144/82, SpO2 95% on RA During activity: HR 117 Post-activity: BP 159/73   Mabeline Caras, PT, DPT Mobility Team Pager [phone]

## 2019-10-25 NOTE — Progress Notes (Signed)
CARDIAC REHAB PHASE I   PRE:  Rate/Rhythm: 92 SR    BP: sitting 118/65    SaO2: 97 2L, 94 RA  MODE:  Ambulation: 430 ft   POST:  Rate/Rhythm: 107 ST    BP: sitting 138/68     SaO2: 93 RA  Pt able to stand and walk with RW. Steady, slow pace. Fatigued with distance. No O2 needed. 97 RA in hall, 93 RA after walk. Tired after walk, to bed to have EPW pulled. Pt needed assist to get legs in bed. Left on RA. Pt sts he does not have anyone home with him till Monday. He is going to check if he can get a RW. [place], ACSM 10/25/2019 1:44 PM

## 2019-10-26 LAB — CBC
HCT: 23.8 % — ABNORMAL LOW (ref 39.0–52.0)
Hemoglobin: 7.6 g/dL — ABNORMAL LOW (ref 13.0–17.0)
MCH: 27.5 pg (ref 26.0–34.0)
MCHC: 31.9 g/dL (ref 30.0–36.0)
MCV: 86.2 fL (ref 80.0–100.0)
Platelets: 200 10*3/uL (ref 150–400)
RBC: 2.76 MIL/uL — ABNORMAL LOW (ref 4.22–5.81)
RDW: 14 % (ref 11.5–15.5)
WBC: 9 10*3/uL (ref 4.0–10.5)
nRBC: 0 % (ref 0.0–0.2)

## 2019-10-26 LAB — BASIC METABOLIC PANEL
Anion gap: 10 (ref 5–15)
BUN: 40 mg/dL — ABNORMAL HIGH (ref 8–23)
CO2: 24 mmol/L (ref 22–32)
Calcium: 8.3 mg/dL — ABNORMAL LOW (ref 8.9–10.3)
Chloride: 105 mmol/L (ref 98–111)
Creatinine, Ser: 1.39 mg/dL — ABNORMAL HIGH (ref 0.61–1.24)
GFR calc Af Amer: >60 mL/min (ref >60)
GFR calc non Af Amer: 53 mL/min — ABNORMAL LOW (ref >60)
Glucose, Bld: 103 mg/dL — ABNORMAL HIGH (ref 70–99)
Potassium: 3.8 mmol/L (ref 3.5–5.1)
Sodium: 139 mmol/L (ref 135–145)

## 2019-10-26 LAB — GLUCOSE, CAPILLARY
Glucose-Capillary: 103 mg/dL — ABNORMAL HIGH (ref 70–99)
Glucose-Capillary: 125 mg/dL — ABNORMAL HIGH (ref 70–99)
Glucose-Capillary: 101 mg/dL — ABNORMAL HIGH (ref 70–99)
Glucose-Capillary: 104 mg/dL — ABNORMAL HIGH (ref 70–99)

## 2019-10-26 MED ORDER — LEVALBUTEROL HCL 0.63 MG/3ML IN NEBU: 0.6300 mg | INHALATION_SOLUTION | Freq: Four times a day (QID) | RESPIRATORY_TRACT | Status: DC | PRN

## 2019-10-26 NOTE — Progress Notes (Signed)
Pt walking the hall indecently using RW.  HR 115.

## 2019-10-26 NOTE — Progress Notes (Signed)
CARDIAC REHAB PHASE I   PRE:  Rate/Rhythm: Sinus Rhythm 91  BP:  Supine: 139/76     SaO2: 96%  MODE:  Ambulation: 430 ft   POST:  Rate/Rhythem: Sinus Rhythm 97  BP:    Sitting: 144/78    SaO2: 97%  1045-1110 Patient walked independently in the hallway using rolling walker without complaints or difficulty. Reviewed home exercise instructions with the patient. Discussed temperature precautions RPE scale. Mr Sams says he is interested in participating in phase 2 cardiac rehab in [place].Barnet Pall, RN,BSN 10/26/2019 11:32 AM

## 2019-10-26 NOTE — Plan of Care (Signed)
  Problem: Clinical Measurements: Goal: Ability to maintain clinical measurements within normal limits will improve Outcome: Progressing   Problem: Clinical Measurements: Goal: Cardiovascular complication will be avoided Outcome: Progressing   Problem: Coping: Goal: Level of anxiety will decrease Outcome: Not Progressing

## 2019-10-26 NOTE — Progress Notes (Addendum)
      [address]       [place] [address]             [phone]      5 Days Post-Op Procedure(s) (LRB): CORONARY ARTERY BYPASS GRAFTING (CABG) x three, using left internal mammary artery, left radial artery harvested endoscopically and right greater saphenous vein harvested endoscopically (N/A) RIGHT RADIAL ARTERY HARVESTED ENDOSCOPICALLY (Left) TRANSESOPHAGEAL ECHOCARDIOGRAM (TEE) (N/A) Subjective: Feels okay this morning. Still having some incisional pain.  Objective: Vital signs in last 24 hours: Temp:  [98.3 F (36.8 C)-99 F (37.2 C)] 98.3 F (36.8 C) (03/27 0303) Pulse Rate:  [86-102] 95 (03/27 0303) Cardiac Rhythm: Normal sinus rhythm (03/26 1955) Resp:  [11-27] 26 (03/26 2012) BP: (99-171)/(61-87) 133/70 (03/27 0303) SpO2:  [89 %-98 %] 94 % (03/27 0303) Weight:  [85.5 kg] 85.5 kg (03/27 0303)     Intake/Output from previous day: 03/26 0701 - 03/27 0700 In: 3 [I.V.:3] Out: -  Intake/Output this shift: No intake/output data recorded.  General appearance: alert, cooperative and no distress Heart: regular rate and rhythm, S1, S2 normal, no murmur, click, rub or gallop Lungs: clear to auscultation bilaterally Abdomen: soft, non-tender; bowel sounds normal; no masses,  no organomegaly Extremities: extremities normal, atraumatic, no cyanosis or edema Wound: clean and dry  Lab Results: Recent Labs    10/25/19 0251 10/26/19 0252  WBC 12.9* 9.0  HGB 7.7* 7.6*  HCT 24.3* 23.8*  PLT 173 200   BMET:  Recent Labs    10/25/19 0251 10/26/19 0252  NA 139 139  K 3.8 3.8  CL 105 105  CO2 23 24  GLUCOSE 115* 103*  BUN 44* 40*  CREATININE 2.05* 1.39*  CALCIUM 8.4* 8.3*    PT/INR: No results for input(s): LABPROT, INR in the last 72 hours. ABG    Component Value Date/Time   PHART 7.330 (L) 10/21/2019 1951   HCO3 23.0 10/21/2019 1951   TCO2 24 10/21/2019 1951   ACIDBASEDEF 3.0 (H) 10/21/2019 1951   O2SAT 99.0 10/21/2019 1951   CBG  (last 3)  Recent Labs    10/25/19 1714 10/25/19 2138 10/26/19 0611  GLUCAP 153* 98 103*    Assessment/Plan: S/P Procedure(s) (LRB): CORONARY ARTERY BYPASS GRAFTING (CABG) x three, using left internal mammary artery, left radial artery harvested endoscopically and right greater saphenous vein harvested endoscopically (N/A) RIGHT RADIAL ARTERY HARVESTED ENDOSCOPICALLY (Left) TRANSESOPHAGEAL ECHOCARDIOGRAM (TEE) (N/A)  1. CV-NSR in the 90s, BP well controlled. Continue ASA, statin, and metoprolol. Imdur for radial artery harvest.  2. Pulm-tolerating room air with excellent oxygen saturation. CXR yesterday stable with small left pleural effusion.  3. Renal-creatinine 1.39 which has decreased from 2.05 yesterday.  4. H and H okay 7.6/23.6, expected acute blood loss anemia, stable 5. Endo-blood glucose is well controlled. No history of diabetes. A1C is 6.1  Plan: EPW removed. Creatinine back to baseline. No help at home but will have someone available Monday to stay with him. Continue ambulation in the halls today. Continue incentive spirometer.    LOS: 5 days    Elgie Collard 10/26/2019   I have seen and examined the patient and agree with the assessment and plan as outlined.  Rexene Alberts, MD 10/26/2019

## 2019-10-27 LAB — GLUCOSE, CAPILLARY
Glucose-Capillary: 96 mg/dL (ref 70–99)
Glucose-Capillary: 106 mg/dL — ABNORMAL HIGH (ref 70–99)

## 2019-10-27 MED ORDER — ASPIRIN 325 MG PO TBEC: 325.0000 mg | DELAYED_RELEASE_TABLET | Freq: Every day | ORAL | 0 refills | Status: DC

## 2019-10-27 MED ORDER — ISOSORBIDE MONONITRATE ER 30 MG PO TB24: 15.0000 mg | ORAL_TABLET | Freq: Every day | ORAL | 0 refills | Status: DC

## 2019-10-27 MED ORDER — FUROSEMIDE 40 MG PO TABS: 40.0000 mg | ORAL_TABLET | Freq: Every day | ORAL | 0 refills | Status: DC

## 2019-10-27 MED ORDER — OXYCODONE HCL 5 MG PO TABS: 5.0000 mg | ORAL_TABLET | Freq: Four times a day (QID) | ORAL | 0 refills | Status: DC | PRN

## 2019-10-27 MED ORDER — POTASSIUM CHLORIDE ER 10 MEQ PO CPCR: 20.0000 meq | ORAL_CAPSULE | Freq: Every day | ORAL | 0 refills | Status: DC

## 2019-10-27 MED ORDER — LISINOPRIL 10 MG PO TABS
10.0000 mg | ORAL_TABLET | Freq: Every day | ORAL | Status: DC
  Administered 2019-10-27: 10 mg via ORAL
  Filled 2019-10-27: qty 1

## 2019-10-27 MED ORDER — METOPROLOL TARTRATE 25 MG PO TABS: 12.5000 mg | ORAL_TABLET | Freq: Two times a day (BID) | ORAL | 1 refills | Status: DC

## 2019-10-27 NOTE — Progress Notes (Signed)
Pt discharged home with daughter.  She will stay with him until his partner gets home later tonight.  AVS reviewed with Pt including all meds and follow up appts.  All questions answered.

## 2019-10-27 NOTE — Progress Notes (Signed)
Pt ambulated x 470 feet with front wheel walker, pt tolerated well  

## 2019-10-27 NOTE — Discharge Instructions (Signed)
Endoscopic Saphenous Vein Harvesting, Care After This sheet gives you information about how to care for yourself after your procedure. Your health care provider may also give you more specific instructions. If you have problems or questions, contact your health care provider. What can I expect after the procedure? After the procedure, it is common to have:  Pain.  Bruising.  Swelling.  Numbness. Follow these instructions at home: Incision care   Follow instructions from your health care provider about how to take care of your incisions. Make sure you: ? Wash your hands with soap and water before and after you change your bandages (dressings). If soap and water are not available, use hand sanitizer. ? Change your dressings as told by your health care provider. ? Leave stitches (sutures), skin glue, or adhesive strips in place. These skin closures may need to stay in place for 2 weeks or longer. If adhesive strip edges start to loosen and curl up, you may trim the loose edges. Do not remove adhesive strips completely unless your health care provider tells you to do that.  Check your incision areas every day for signs of infection. Check for: ? More redness, swelling, or pain. ? Fluid or blood. ? Warmth. ? Pus or a bad smell. Medicines  Take over-the-counter and prescription medicines only as told by your health care provider.  Ask your health care provider if the medicine prescribed to you requires you to avoid driving or using heavy machinery. General instructions  Raise (elevate) your legs above the level of your heart while you are sitting or lying down.  Avoid crossing your legs.  Avoid sitting for long periods of time. Change positions every 30 minutes.  Do any exercises your health care providers have given you. These may include deep breathing, coughing, and walking exercises.  Do not take baths, swim, or use a hot tub until your health care provider approves. Ask your  health care provider if you may take showers. You may only be allowed to take sponge baths.  Wear compression stockings as told by your health care provider. These stockings help to prevent blood clots and reduce swelling in your legs.  Keep all follow-up visits as told by your health care provider. This is important. Contact a health care provider if:  Medicine does not help your pain.  Your pain gets worse.  You have new leg bruises or your leg bruises get bigger.  Your leg feels numb.  You have more redness, swelling, or pain around your incision.  You have fluid or blood coming from your incision.  Your incision feels warm to the touch.  You have pus or a bad smell coming from your incision.  You have a fever. Get help right away if:  Your pain is severe.  You develop pain, tenderness, warmth, redness, or swelling in any part of your leg.  You have chest pain.  You have trouble breathing. Summary  Raise (elevate) your legs above the level of your heart while you are sitting or lying down.  Wear compression stockings as told by your health care provider.  Make sure you know which symptoms should prompt you to contact your health care provider.  Keep all follow-up visits as told by your health care provider. This information is not intended to replace advice given to you by your health care provider. Make sure you discuss any questions you have with your health care provider. Document Revised: 06/25/2018 Document Reviewed: 06/25/2018 Elsevier Patient Education    2020 [employer] Coronary Artery Bypass Grafting, Care After This sheet gives you information about how to care for yourself after your procedure. Your doctor may also give you more specific instructions. If you have problems or questions, call your doctor. What can I expect after the procedure? After the procedure, it is common to:  Feel sick to your stomach (nauseous).  Not want to eat as much as  normal (lack of appetite).  Have trouble pooping (constipation).  Have weakness and tiredness (fatigue).  Feel sad (depressed) or grouchy (irritable).  Have pain or discomfort around the cuts from surgery (incisions). Follow these instructions at home: Medicines  Take over-the-counter and prescription medicines only as told by your doctor. Do not stop taking medicines or start any new medicines unless your doctor says it is okay.  If you were prescribed an antibiotic medicine, take it as told by your doctor. Do not stop taking the antibiotic even if you start to feel better. Incision care   Follow instructions from your doctor about how to take care of your cuts from surgery. Make sure you: ? Wash your hands with soap and water before and after you change your bandage (dressing). If you cannot use soap and water, use hand sanitizer. ? Change your bandage as told by your doctor. ? Leave stitches (sutures), skin glue, or skin tape (adhesive) strips in place. They may need to stay in place for 2 weeks or longer. If tape strips get loose and curl up, you may trim the loose edges. Do not remove tape strips completely unless your doctor says it is okay.  Make sure the surgery cuts are clean, dry, and protected.  Check your cut areas every day for signs of infection. Check for: ? More redness, swelling, or pain. ? More fluid or blood. ? Warmth. ? Pus or a bad smell.  If cuts were made in your legs: ? Avoid crossing your legs. ? Avoid sitting for long periods of time. Change positions every 30 minutes. ? Raise (elevate) your legs when you are sitting. Bathing  Do not take baths, swim, or use a hot tub until your doctor says it is okay.  You may shower. Pat the surgery cuts dry. Do not rub the cuts to dry.  Eating and drinking   Eat foods that are high in fiber, such as beans, nuts, whole grains, and raw fruits and vegetables. Any meats you eat should be lean cut. Avoid canned,  processed, and fried foods. This can help prevent trouble pooping. This is also a part of a heart-healthy diet.  Drink enough fluid to keep your pee (urine) pale yellow.  Do not drink alcohol until you are fully recovered. Ask your doctor when it is safe to drink alcohol. Activity  Rest and limit your activity as told by your doctor. You may be told to: ? Stop any activity right away if you have chest pain, shortness of breath, irregular heartbeats, or dizziness. Get help right away if you have any of these symptoms. ? Move around often for short periods or take short walks as told by your doctor. Slowly increase your activities. ? Avoid lifting, pushing, or pulling anything that is heavier than 10 lb (4.5 kg) for at least 6 weeks or as told by your doctor.  Do physical therapy or a cardiac rehab (cardiac rehabilitation) program as told by your doctor. ? Physical therapy involves doing exercises to maintain movement and build strength and endurance. ? A cardiac rehab   program includes:  Exercise training.  Education.  Counseling.  Do not drive until your doctor says it is okay.  Ask your doctor when you can go back to work.  Ask your doctor when you can be sexually active. General instructions  Do not drive or use heavy machinery while taking prescription pain medicine.  Do not use any products that contain nicotine or tobacco. These include cigarettes, e-cigarettes, and chewing tobacco. If you need help quitting, ask your doctor.  Take 2-3 deep breaths every few hours during the day while you get better. This helps expand your lungs and prevent problems.  If you were given a device called an incentive spirometer, use it several times a day to practice deep breathing. Support your chest with a pillow or your arms when you take deep breaths or cough.  Wear compression stockings as told by your doctor.  Weigh yourself every day. This helps to see if your body is holding  (retaining) fluid that may make your heart and lungs work harder.  Keep all follow-up visits as told by your doctor. This is important. Contact a doctor if:  You have more redness, swelling, or pain around any cut.  You have more fluid or blood coming from any cut.  Any cut feels warm to the touch.  You have pus or a bad smell coming from any cut.  You have a fever.  You have swelling in your ankles or legs.  You have pain in your legs.  You gain 2 lb (0.9 kg) or more a day.  You feel sick to your stomach or you throw up (vomit).  You have watery poop (diarrhea). Get help right away if:  You have chest pain that goes to your jaw or arms.  You are short of breath.  You have a fast or irregular heartbeat.  You notice a "clicking" in your breastbone (sternum) when you move.  You have any signs of a stroke. "BE FAST" is an easy way to remember the main warning signs: ? B - Balance. Signs are dizziness, sudden trouble walking, or loss of balance. ? E - Eyes. Signs are trouble seeing or a change in how you see. ? F - Face. Signs are sudden weakness or loss of feeling of the face, or the face or eyelid drooping on one side. ? A - Arms. Signs are weakness or loss of feeling in an arm. This happens suddenly and usually on one side of the body. ? S - Speech. Signs are sudden trouble speaking, slurred speech, or trouble understanding what people say. ? T - Time. Time to call emergency services. Write down what time symptoms started.  You have other signs of a stroke, such as: ? A sudden, very bad headache with no known cause. ? Feeling sick to your stomach. ? Throwing up. ? Jerky movements you cannot control (seizure). These symptoms may be an emergency. Do not wait to see if the symptoms will go away. Get medical help right away. Call your local emergency services (911 in the [place] Do not drive yourself to the hospital. Summary  After the procedure, it is common to have pain  or discomfort in the cuts from surgery (incisions).  Do not take baths, swim, or use a hot tub until your doctor says it is okay.  Slowly increase your activities. You may need physical therapy or cardiac rehab.  Weigh yourself every day. This helps to see if your body is holding fluid. This  information is not intended to replace advice given to you by your health care provider. Make sure you discuss any questions you have with your health care provider. Document Revised: 03/27/2018 Document Reviewed: 03/27/2018 Elsevier Patient Education  2020 [employer].

## 2019-10-27 NOTE — Progress Notes (Addendum)
      [address]       [place] [address]             [phone]      6 Days Post-Op Procedure(s) (LRB): CORONARY ARTERY BYPASS GRAFTING (CABG) x three, using left internal mammary artery, left radial artery harvested endoscopically and right greater saphenous vein harvested endoscopically (N/A) RIGHT RADIAL ARTERY HARVESTED ENDOSCOPICALLY (Left) TRANSESOPHAGEAL ECHOCARDIOGRAM (TEE) (N/A) Subjective: Already walked in the halls this morning. Feels good.   Objective: Vital signs in last 24 hours: Temp:  [97.9 F (36.6 C)-100 F (37.8 C)] 97.9 F (36.6 C) (03/28 0437) Pulse Rate:  [90-104] 90 (03/28 0437) Cardiac Rhythm: Normal sinus rhythm (03/28 0437) Resp:  [11-29] 29 (03/28 0636) BP: (144-169)/(76-90) 151/90 (03/28 0437) SpO2:  [92 %-98 %] 98 % (03/28 0437) Weight:  [84.4 kg] 84.4 kg (03/28 0636)     Intake/Output from previous day: 03/27 0701 - 03/28 0700 In: 840 [P.O.:840] Out: -  Intake/Output this shift: No intake/output data recorded.  General appearance: alert, cooperative and no distress Heart: regular rate and rhythm, S1, S2 normal, no murmur, click, rub or gallop Lungs: clear to auscultation bilaterally Abdomen: soft, non-tender; bowel sounds normal; no masses,  no organomegaly Extremities: extremities normal, atraumatic, no cyanosis or edema Wound: clean and dry  Lab Results: Recent Labs    10/25/19 0251 10/26/19 0252  WBC 12.9* 9.0  HGB 7.7* 7.6*  HCT 24.3* 23.8*  PLT 173 200   BMET:  Recent Labs    10/25/19 0251 10/26/19 0252  NA 139 139  K 3.8 3.8  CL 105 105  CO2 23 24  GLUCOSE 115* 103*  BUN 44* 40*  CREATININE 2.05* 1.39*  CALCIUM 8.4* 8.3*    PT/INR: No results for input(s): LABPROT, INR in the last 72 hours. ABG    Component Value Date/Time   PHART 7.330 (L) 10/21/2019 1951   HCO3 23.0 10/21/2019 1951   TCO2 24 10/21/2019 1951   ACIDBASEDEF 3.0 (H) 10/21/2019 1951   O2SAT 99.0 10/21/2019 1951   CBG  (last 3)  Recent Labs    10/26/19 1817 10/26/19 2113 10/27/19 0558  GLUCAP 101* 104* 96    Assessment/Plan: S/P Procedure(s) (LRB): CORONARY ARTERY BYPASS GRAFTING (CABG) x three, using left internal mammary artery, left radial artery harvested endoscopically and right greater saphenous vein harvested endoscopically (N/A) RIGHT RADIAL ARTERY HARVESTED ENDOSCOPICALLY (Left) TRANSESOPHAGEAL ECHOCARDIOGRAM (TEE) (N/A)  1. CV-NSR in the 90s, BP elevated-will start back lisinopril. Continue ASA, statin, and metoprolol. Imdur for radial artery harvest.  2. Pulm-tolerating room air with excellent oxygen saturation. CXR yesterday stable with small left pleural effusion.  3. Renal-creatinine 1.39 which has decreased from 2.05 yesterday.  4. H and H okay 7.6/23.6, expected acute blood loss anemia, stable 5. Endo-blood glucose is well controlled. No history of diabetes. A1C is 6.1  Plan: Creatinine back to baseline. Medically doing well.  His ride is coming for him Monday morning and he will have help at home. Continue ambulation in the halls today. Continue incentive spirometer. Labs in the morning.    LOS: 6 days    Elgie Collard 10/27/2019   I have seen and examined the patient and agree with the assessment and plan as outlined.  Rexene Alberts, MD 10/27/2019 1:37 PM

## 2019-10-30 ENCOUNTER — Other Ambulatory Visit: Payer: Self-pay

## 2019-10-30 ENCOUNTER — Ambulatory Visit (INDEPENDENT_AMBULATORY_CARE_PROVIDER_SITE_OTHER): Payer: Self-pay

## 2019-10-30 VITALS — BP 122/75 | Temp 97.9°F

## 2019-10-30 DIAGNOSIS — Z951 Presence of aortocoronary bypass graft: Secondary | ICD-10-CM

## 2019-10-30 DIAGNOSIS — Z4802 Encounter for removal of sutures: Secondary | ICD-10-CM

## 2019-10-30 MED ORDER — FUROSEMIDE 40 MG PO TABS: 40.0000 mg | ORAL_TABLET | Freq: Every day | ORAL | 0 refills | Status: DC

## 2019-10-30 NOTE — Progress Notes (Addendum)
Pt comes in today for suture/staple removal s/p CABG 10/21/19. Consulted Dr. Prescott Gum about removing these since it has been 9 days post-op. He assesses incisions and states it's okay to remove both chest tube site sutures and to remove every other staple from the six staples to L wrist. Did this easily per standard protocol and pt tolerated well. All incisions are well approximated and w/o s/s of infection. Dr. Prescott Gum instructs pt to return in one week to remove remaining three staples from L wrist. Pt discusses bil feet edema and BP med changes w/ Dr. Prescott Gum; BP taken per request. Pt states another provider reduced his Zestoretic to 10 mg bid, and Dr. Prescott Gum orders for him to continue this regimen; also orders for pt to take Lasix 40 mg x 10 days. Rx submitted to [facility].

## 2019-11-01 ENCOUNTER — Encounter: Payer: Self-pay | Admitting: Physician Assistant

## 2019-11-01 ENCOUNTER — Other Ambulatory Visit: Payer: Self-pay

## 2019-11-01 ENCOUNTER — Ambulatory Visit: Payer: PPO | Admitting: Physician Assistant

## 2019-11-01 VITALS — BP 124/72 | HR 82 | Temp 98.4°F | Ht 65.0 in | Wt 182.8 lb

## 2019-11-01 DIAGNOSIS — R7303 Prediabetes: Secondary | ICD-10-CM | POA: Diagnosis not present

## 2019-11-01 DIAGNOSIS — N179 Acute kidney failure, unspecified: Secondary | ICD-10-CM

## 2019-11-01 DIAGNOSIS — I10 Essential (primary) hypertension: Secondary | ICD-10-CM

## 2019-11-01 DIAGNOSIS — E785 Hyperlipidemia, unspecified: Secondary | ICD-10-CM | POA: Diagnosis not present

## 2019-11-01 DIAGNOSIS — Z951 Presence of aortocoronary bypass graft: Secondary | ICD-10-CM

## 2019-11-01 DIAGNOSIS — D649 Anemia, unspecified: Secondary | ICD-10-CM | POA: Diagnosis not present

## 2019-11-01 LAB — BASIC METABOLIC PANEL
BUN/Creatinine Ratio: 14 (ref 10–24)
BUN: 18 mg/dL (ref 8–27)
CO2: 24 mmol/L (ref 20–29)
Calcium: 9.3 mg/dL (ref 8.6–10.2)
Chloride: 99 mmol/L (ref 96–106)
Creatinine, Ser: 1.25 mg/dL (ref 0.76–1.27)
GFR calc Af Amer: 69 mL/min/{1.73_m2} (ref >59)
GFR calc non Af Amer: 60 mL/min/{1.73_m2} (ref >59)
Glucose: 95 mg/dL (ref 65–99)
Potassium: 5 mmol/L (ref 3.5–5.2)
Sodium: 138 mmol/L (ref 134–144)

## 2019-11-01 LAB — CBC
Hematocrit: 28.8 % — ABNORMAL LOW (ref 37.5–51.0)
Hemoglobin: 9.3 g/dL — ABNORMAL LOW (ref 13.0–17.7)
MCH: 27.3 pg (ref 26.6–33.0)
MCHC: 32.3 g/dL (ref 31.5–35.7)
MCV: 85 fL (ref 79–97)
Platelets: 453 10*3/uL — ABNORMAL HIGH (ref 150–450)
RBC: 3.41 x10E6/uL — ABNORMAL LOW (ref 4.14–5.80)
RDW: 12.5 % (ref 11.6–15.4)
WBC: 11.4 10*3/uL — ABNORMAL HIGH (ref 3.4–10.8)

## 2019-11-01 MED ORDER — ATORVASTATIN CALCIUM 40 MG PO TABS: 40.0000 mg | ORAL_TABLET | ORAL | 3 refills | Status: DC

## 2019-11-01 NOTE — Progress Notes (Signed)
Cardiology Office Note:    Date:  11/03/2019   ID:  Rodney Young, DOB [birth year], MRN [id number]  PCP:  Townsend Roger, MD  Cardiologist:  Elouise Munroe, MD  Electrophysiologist:  None   Referring MD: Townsend Roger, MD   Chief Complaint  Patient presents with  . Hospitalization Follow-up    seen for Dr. Margaretann Loveless, s/p CABG    History of Present Illness:    Rodney Young is a 64 y.o. male with a hx of hypertension, hyperlipidemia, prediabetes and recently diagnosed CAD status post CABG.  Patient was initially seen by Dr. Margaretann Loveless who recommended a ETT to evaluate for his chest pain.  EGD came back abnormal.  He underwent cardiac catheterization on 10/14/2019 which demonstrated 90% distal RCA lesion, 80% proximal to mid left circumflex lesion, 75% mid LAD lesion, EF 55 to 65% by LV gram.  Patient eventually underwent CABG x3 with LIMA-LAD, left radial to OM and SVG to distal posterior descending.  Post surgery, he was started on Imdur for radial artery harvest.  Remainder of hospitalization was otherwise uncomplicated.  He presents today for cardiology office visit.  He continues to have significant soreness in the radial artery harvest site and the sternotomy site.  He has good appetite however has some issues sleeping after the surgery.  He finished the initial course of diuretic, lower extremity continue to have at least 2+ pitting edema.  He was given another course of diuretic.  On physical exam, I did not hear any heart murmur.  I recommend a CBC and a basic metabolic panel today to follow-up on the anemia and AKI.  Looking at his previous cholesterol lab work obtained in October 2020, total cholesterol 185, HDL 33, LDL 107, triglyceride 226.  I will increase his current Lipitor to 40 mg daily.  He will need a 63-month fasting lipid panel and LFT.  He can follow-up in 2 weeks with Dr. Margaretann Loveless or reassess his volume status and lower extremity edema.  Overall he is stable from cardiology  perspective.  As for his sternotomy pain, I recommend that he discuss with his surgeon regarding pain medications.  Past Medical History:  Diagnosis Date  . Anxiety   . Cancer [facility])    prostate  . Coronary artery disease   . GERD (gastroesophageal reflux disease)   . Headache   . Hyperlipidemia   . Hypertension     Past Surgical History:  Procedure Laterality Date  . CORONARY ARTERY BYPASS GRAFT N/A 10/21/2019   Procedure: CORONARY ARTERY BYPASS GRAFTING (CABG) x three, using left internal mammary artery, left radial artery harvested endoscopically and right greater saphenous vein harvested endoscopically;  Surgeon: Ivin Poot, MD;  Location: [place];  Service: Open Heart Surgery;  Laterality: N/A;  . LEFT HEART CATH AND CORONARY ANGIOGRAPHY N/A 10/14/2019   Procedure: LEFT HEART CATH AND CORONARY ANGIOGRAPHY;  Surgeon: Lorretta Harp, MD;  Location: [place] CV LAB;  Service: Cardiovascular;  Laterality: N/A;  . PROSTATECTOMY    . RADIAL ARTERY HARVEST Left 10/21/2019   Procedure: RIGHT RADIAL ARTERY HARVESTED ENDOSCOPICALLY;  Surgeon: Ivin Poot, MD;  Location: [place];  Service: Open Heart Surgery;  Laterality: Left;  . TEE WITHOUT CARDIOVERSION N/A 10/21/2019   Procedure: TRANSESOPHAGEAL ECHOCARDIOGRAM (TEE);  Surgeon: Prescott Gum, Collier Salina, MD;  Location: [place];  Service: Open Heart Surgery;  Laterality: N/A;    Current Medications: No outpatient medications have been marked as taking for the 11/01/19 encounter (Office  Visit) with Almyra Deforest, [place].     Allergies:   Imdur [isosorbide nitrate]   Social History   Socioeconomic History  . Marital status: Single    Spouse name: Not on file  . Number of children: Not on file  . Years of education: Not on file  . Highest education level: Not on file  Occupational History  . Not on file  Tobacco Use  . Smoking status: Never Smoker  . Smokeless tobacco: Never Used  Substance and Sexual Activity  . Alcohol use: Not Currently   . Drug use: Never  . Sexual activity: Not on file  Other Topics Concern  . Not on file  Social History Narrative  . Not on file   Social Determinants of Health   Financial Resource Strain:   . Difficulty of Paying Living Expenses:   Food Insecurity:   . Worried About Charity fundraiser in the Last Year:   . Arboriculturist in the Last Year:   Transportation Needs:   . Film/video editor (Medical):   Marland Kitchen Lack of Transportation (Non-Medical):   Physical Activity:   . Days of Exercise per Week:   . Minutes of Exercise per Session:   Stress:   . Feeling of Stress :   Social Connections:   . Frequency of Communication with Friends and Family:   . Frequency of Social Gatherings with Friends and Family:   . Attends Religious Services:   . Active Member of Clubs or Organizations:   . Attends Archivist Meetings:   Marland Kitchen Marital Status:      Family History: The patient's family history includes Heart attack in his father.  ROS:   Please see the history of present illness.     All other systems reviewed and are negative.  EKGs/Labs/Other Studies Reviewed:    The following studies were reviewed today:  Cath 10/14/2019  Dist RCA lesion is 90% stenosed.  Prox Cx to Mid Cx lesion is 80% stenosed.  Mid LAD lesion is 75% stenosed.  The left ventricular systolic function is normal.  LV end diastolic pressure is normal.  The left ventricular ejection fraction is 55-65% by visual estimate.    EKG:  EKG is ordered today.  The ekg ordered today demonstrates normal sinus rhythm without significant ST-T wave changes  Recent Labs: 10/22/2019: Magnesium 2.8 10/24/2019: ALT 48 11/01/2019: BUN 18; Creatinine, Ser 1.25; Hemoglobin 9.3; Platelets 453; Potassium 5.0; Sodium 138  Recent Lipid Panel No results found for: CHOL, TRIG, HDL, CHOLHDL, VLDL, LDLCALC, LDLDIRECT  Physical Exam:    VS:  BP 124/72   Pulse 82   Temp 98.4 F (36.9 C)   Ht 5\' 5"  (1.651 m)   Wt 182  lb 12.8 oz (82.9 kg)   SpO2 97%   BMI 30.42 kg/m     Wt Readings from Last 3 Encounters:  11/01/19 182 lb 12.8 oz (82.9 kg)  10/27/19 186 lb (84.4 kg)  10/17/19 180 lb 14.4 oz (82.1 kg)     GEN:  Well nourished, well developed in no acute distress HEENT: Normal NECK: No JVD; No carotid bruits LYMPHATICS: No lymphadenopathy CARDIAC: RRR, no murmurs, rubs, gallops RESPIRATORY:  Clear to auscultation without rales, wheezing or rhonchi  ABDOMEN: Soft, non-tender, non-distended MUSCULOSKELETAL:  No edema; No deformity  SKIN: Warm and dry NEUROLOGIC:  Alert and oriented x 3 PSYCHIATRIC:  Normal affect   ASSESSMENT:    1. S/P CABG x 3   2.  Hyperlipidemia, unspecified hyperlipidemia type   3. AKI (acute kidney injury) ([place])   4. Anemia, unspecified type   5. Essential hypertension   6. Prediabetes    PLAN:    In order of problems listed above:  1. CAD s/p CABG: Sternotomy scar is well-healed.  He does have chest soreness, however no exertional chest pain.  He is on 325 mg aspirin for the first 3 months after CABG per CT surgery protocol.  He is also on low-dose Imdur to prevent vasospasm after radial artery bypass graft.  Will defer to MD to decide when to switch aspirin to the lower dose after 35-month  2. Hyperlipidemia: Increase Lipitor to 40 mg daily.  Fasting lipid panel and LFT in 2 months  3. AKI: Obtain basic metabolic panel  4. Anemia: Obtain CBC  5. Hypertension: Blood pressure stable on current therapy  6. Prediabetes: We will defer management to primary care provider.   Medication Adjustments/Labs and Tests Ordered: Current medicines are reviewed at length with the patient today.  Concerns regarding medicines are outlined above.  Orders Placed This Encounter  Procedures  . Basic metabolic panel  . CBC  . Hepatic function panel  . Lipid panel  . EKG 12-Lead   Meds ordered this encounter  Medications  . atorvastatin (LIPITOR) 40 MG tablet    Sig: Take 1  tablet (40 mg total) by mouth every other day. Lunch    Dispense:  90 tablet    Refill:  3    Patient Instructions  Medication Instructions:   INCREASE Lipitor to 40 mg daily  *If you need a refill on your cardiac medications before your next appointment, please call your pharmacy*  Lab Work: Your physician recommends that you return for lab work in [place]:  BMET  [place] recommends that you return for lab work in: 2 [place] MIDNIGHT. ([place])  HEPATIC (LIVER) FUNCTION  If you have labs (blood work) drawn today and your tests are completely normal, you will receive your results only by: Marland Kitchen MyChart Message (if you have MyChart) OR . A paper copy in the mail If you have any lab test that is abnormal or we need to change your treatment, we will call you to review the results.  Testing/Procedures: NONE ordered at this time of appointment   Follow-Up: At [facility], you and your health needs are our priority.  As part of our continuing mission to provide you with exceptional heart care, we have created designated Provider Care Teams.  These Care Teams include your primary Cardiologist (physician) and Advanced Practice Providers (APPs -  Physician Assistants and Nurse Practitioners) who all work together to provide you with the care you need, when you need it.   Your next appointment:   As scheduled  11/15/19 @10 :00AM  The format for your next appointment:   In Person  Provider:   Cherlynn Kaiser, MD  Other Instructions      Signed, Almyra Deforest, [place]  11/03/2019 10:08 PM    [place]

## 2019-11-01 NOTE — Patient Instructions (Signed)
Medication Instructions:   INCREASE Lipitor to 40 mg daily  *If you need a refill on your cardiac medications before your next appointment, please call your pharmacy*  Lab Work: Your physician recommends that you return for lab work in [place]:  BMET  [place] recommends that you return for lab work in: 2 [place] MIDNIGHT. ([place])  HEPATIC (LIVER) FUNCTION  If you have labs (blood work) drawn today and your tests are completely normal, you will receive your results only by: Marland Kitchen MyChart Message (if you have MyChart) OR . A paper copy in the mail If you have any lab test that is abnormal or we need to change your treatment, we will call you to review the results.  Testing/Procedures: NONE ordered at this time of appointment   Follow-Up: At [facility], you and your health needs are our priority.  As part of our continuing mission to provide you with exceptional heart care, we have created designated Provider Care Teams.  These Care Teams include your primary Cardiologist (physician) and Advanced Practice Providers (APPs -  Physician Assistants and Nurse Practitioners) who all work together to provide you with the care you need, when you need it.   Your next appointment:   As scheduled  11/15/19 @10 :00AM  The format for your next appointment:   In Person  Provider:   Cherlynn Kaiser, MD  Other Instructions

## 2019-11-03 ENCOUNTER — Encounter: Payer: Self-pay | Admitting: Physician Assistant

## 2019-11-04 ENCOUNTER — Telehealth (HOSPITAL_COMMUNITY): Payer: Self-pay

## 2019-11-04 NOTE — Telephone Encounter (Signed)
Faxed referral to [facility] for Cardiac Rehab. 

## 2019-11-06 ENCOUNTER — Ambulatory Visit (INDEPENDENT_AMBULATORY_CARE_PROVIDER_SITE_OTHER): Payer: Self-pay

## 2019-11-06 ENCOUNTER — Other Ambulatory Visit: Payer: Self-pay

## 2019-11-06 VITALS — Temp 97.5°F

## 2019-11-06 DIAGNOSIS — Z951 Presence of aortocoronary bypass graft: Secondary | ICD-10-CM

## 2019-11-06 DIAGNOSIS — Z4802 Encounter for removal of sutures: Secondary | ICD-10-CM

## 2019-11-06 NOTE — Progress Notes (Signed)
Pt comes in for the removal of three remaining staples from L wrist s/p CABG 10/21/19. He is a/o and without c/o. L wrist incision is well-approximated and without s/s of infection. Three staples removed without difficulty per standard protocol. Pt tolerates well.

## 2019-11-07 NOTE — Progress Notes (Signed)
Red blood cell count continue to improved, previous acute kidney injury has largely solved, renal function has improved quite a bit. White blood cell count continue to be borderline elevated, will need to monitor for any sign of infection such as fever or chill

## 2019-11-11 NOTE — Progress Notes (Signed)
The lab in 2 month are for his cholesterol. Recommend observe for sign of infection for the time being.

## 2019-11-15 ENCOUNTER — Ambulatory Visit: Payer: PPO | Admitting: Internal Medicine

## 2019-11-15 ENCOUNTER — Encounter: Payer: Self-pay | Admitting: Internal Medicine

## 2019-11-15 ENCOUNTER — Other Ambulatory Visit: Payer: Self-pay

## 2019-11-15 VITALS — BP 118/74 | HR 55 | Ht 65.0 in | Wt 171.6 lb

## 2019-11-15 DIAGNOSIS — I10 Essential (primary) hypertension: Secondary | ICD-10-CM | POA: Diagnosis not present

## 2019-11-15 DIAGNOSIS — E785 Hyperlipidemia, unspecified: Secondary | ICD-10-CM

## 2019-11-15 DIAGNOSIS — Z951 Presence of aortocoronary bypass graft: Secondary | ICD-10-CM

## 2019-11-15 NOTE — Progress Notes (Signed)
Cardiology Office Note:    Date:  11/15/2019   ID:  Rodney Young, DOB [birth year], MRN [id number]  PCP:  Townsend Roger, MD  Cardiologist:  Elouise Munroe, MD  Electrophysiologist:  None   Referring MD: Nona Dell, Corene Cornea, MD   Chief Complaint: f/u CABG  History of Present Illness:    Rodney Young is a 64 y.o. male with a history of hypertension, hyperlipidemia, prediabetes, who presents for follow-up after three-vessel CABG including radial artery graft.  CABG grafts include LIMA to LAD, left radial to OM, and SVG to distal PDA.  Work-up was initiated due to accelerating angina and abnormal exercise treadmill test resulting in coronary angiography demonstrating multivessel disease.  He was last seen in our office on 11/01/2019 by Almyra Deforest PA.  He had some continued edema after hospital stay, and this essentially has resolved now.  Mild edema of the left upper extremity, however the patient states that this is far better than it was in the hospital.  He denies chest pain with exertion, chest pressure, shortness of breath, PND, orthopnea.  Leg swelling has improved.  Denies palpitations, syncope.  Incisional pain has improved as well, as well as pain at radial artery harvest site.  He has some numbness in his left hand which he plans to mention to Dr. Prescott Gum.  We discussed that this will likely improve with time.  Past Medical History:  Diagnosis Date  . Anxiety   . Cancer [facility])    prostate  . Coronary artery disease   . GERD (gastroesophageal reflux disease)   . Headache   . Hyperlipidemia   . Hypertension     Past Surgical History:  Procedure Laterality Date  . CORONARY ARTERY BYPASS GRAFT N/A 10/21/2019   Procedure: CORONARY ARTERY BYPASS GRAFTING (CABG) x three, using left internal mammary artery, left radial artery harvested endoscopically and right greater saphenous vein harvested endoscopically;  Surgeon: Ivin Poot, MD;  Location: [place];  Service: Open Heart  Surgery;  Laterality: N/A;  . LEFT HEART CATH AND CORONARY ANGIOGRAPHY N/A 10/14/2019   Procedure: LEFT HEART CATH AND CORONARY ANGIOGRAPHY;  Surgeon: Lorretta Harp, MD;  Location: [place] CV LAB;  Service: Cardiovascular;  Laterality: N/A;  . PROSTATECTOMY    . RADIAL ARTERY HARVEST Left 10/21/2019   Procedure: RIGHT RADIAL ARTERY HARVESTED ENDOSCOPICALLY;  Surgeon: Ivin Poot, MD;  Location: [place];  Service: Open Heart Surgery;  Laterality: Left;  . TEE WITHOUT CARDIOVERSION N/A 10/21/2019   Procedure: TRANSESOPHAGEAL ECHOCARDIOGRAM (TEE);  Surgeon: Prescott Gum, Collier Salina, MD;  Location: [place];  Service: Open Heart Surgery;  Laterality: N/A;    Current Medications: Current Meds  Medication Sig  . ALPRAZolam (XANAX) 0.5 MG tablet Take 0.5 mg by mouth at bedtime.   Marland Kitchen aspirin EC 325 MG EC tablet Take 1 tablet (325 mg total) by mouth daily.  Marland Kitchen atorvastatin (LIPITOR) 40 MG tablet Take 1 tablet (40 mg total) by mouth every other day. Lunch  . lisinopril-hydrochlorothiazide (ZESTORETIC) 20-12.5 MG tablet Take 1 tablet by mouth in the morning and at bedtime. Pt taking 10 mg bid  . metoprolol tartrate (LOPRESSOR) 25 MG tablet Take 0.5 tablets (12.5 mg total) by mouth 2 (two) times daily.  Marland Kitchen omeprazole (PRILOSEC) 40 MG capsule Take 40 mg by mouth daily.  Marland Kitchen oxyCODONE (OXY IR/ROXICODONE) 5 MG immediate release tablet Take 1 tablet (5 mg total) by mouth every 6 (six) hours as needed for severe pain.  . vitamin  B-12 (CYANOCOBALAMIN) 1000 MCG tablet Take 1,000 mcg by mouth daily.  . [DISCONTINUED] isosorbide mononitrate (IMDUR) 30 MG 24 hr tablet Take 0.5 tablets (15 mg total) by mouth daily.     Allergies:   Imdur [isosorbide nitrate]   Social History   Socioeconomic History  . Marital status: Single    Spouse name: Not on file  . Number of children: Not on file  . Years of education: Not on file  . Highest education level: Not on file  Occupational History  . Not on file  Tobacco Use    . Smoking status: Never Smoker  . Smokeless tobacco: Never Used  Substance and Sexual Activity  . Alcohol use: Not Currently  . Drug use: Never  . Sexual activity: Not on file  Other Topics Concern  . Not on file  Social History Narrative  . Not on file   Social Determinants of Health   Financial Resource Strain:   . Difficulty of Paying Living Expenses:   Food Insecurity:   . Worried About Charity fundraiser in the Last Year:   . Arboriculturist in the Last Year:   Transportation Needs:   . Film/video editor (Medical):   Marland Kitchen Lack of Transportation (Non-Medical):   Physical Activity:   . Days of Exercise per Week:   . Minutes of Exercise per Session:   Stress:   . Feeling of Stress :   Social Connections:   . Frequency of Communication with Friends and Family:   . Frequency of Social Gatherings with Friends and Family:   . Attends Religious Services:   . Active Member of Clubs or Organizations:   . Attends Archivist Meetings:   Marland Kitchen Marital Status:      Family History: The patient's family history includes Heart attack in his father.  ROS:   Please see the history of present illness.    All other systems reviewed and are negative.  EKGs/Labs/Other Studies Reviewed:    The following studies were reviewed today:  EKG:  Sinus bradycardia, rate 55   Recent Labs: 10/22/2019: Magnesium 2.8 10/24/2019: ALT 48 11/01/2019: BUN 18; Creatinine, Ser 1.25; Hemoglobin 9.3; Platelets 453; Potassium 5.0; Sodium 138  Recent Lipid Panel No results found for: CHOL, TRIG, HDL, CHOLHDL, VLDL, LDLCALC, LDLDIRECT  Physical Exam:    VS:  BP 118/74   Pulse (!) 55   Ht 5\' 5"  (1.651 m)   Wt 171 lb 9.6 oz (77.8 kg)   SpO2 99%   BMI 28.56 kg/m     Wt Readings from Last 5 Encounters:  11/19/19 170 lb (77.1 kg)  11/15/19 171 lb 9.6 oz (77.8 kg)  11/01/19 182 lb 12.8 oz (82.9 kg)  10/27/19 186 lb (84.4 kg)  10/17/19 180 lb 14.4 oz (82.1 kg)     Constitutional: No  acute distress Eyes: sclera non-icteric, normal conjunctiva and lids ENMT: Mask in place Cardiovascular: regular rhythm, normal rate, no murmurs. S1 and S2 normal.  Sternotomy incision well-healing.  Radial artery graft harvest site well-healing.  No jugular venous distention.  Saphenous vein graft site well-healing. Respiratory: clear to auscultation bilaterally GI : normal bowel sounds, soft and nontender. No distention.   MSK: extremities warm, well perfused.  Trace left upper extremity edema.  NEURO: grossly nonfocal exam, moves all extremities. PSYCH: alert and oriented x 3, normal mood and affect.   ASSESSMENT:    1. S/P CABG x 3   2. Essential hypertension  3. Hyperlipidemia, unspecified hyperlipidemia type    PLAN:    S/P CABG x 3 - Plan: EKG 12-Lead He is recovering well.  Continue aspirin 325 mg daily for approximately 3 months then transition to indefinite aspirin 81 mg daily.  Continue atorvastatin, metoprolol.  Essential hypertension -continue lisinopril HCTZ, and metoprolol.  Blood pressure well controlled today.  On Imdur for radial artery spasm, duration per CV surgery.  Hyperlipidemia, unspecified hyperlipidemia type-continue atorvastatin.  Total time of encounter: 30 minutes total time of encounter, including 21 minutes spent in face-to-face patient care on the date of this encounter. This time includes coordination of care and counseling regarding above mentioned problem list. Remainder of non-face-to-face time involved reviewing chart documents/testing relevant to the patient encounter and documentation in the medical record. I have independently reviewed documentation from referring provider.   Cherlynn Kaiser, MD [place]  [facility]    Medication Adjustments/Labs and Tests Ordered: Current medicines are reviewed at length with the patient today.  Concerns regarding medicines are outlined above.  Orders Placed This Encounter  Procedures  . EKG  12-Lead   No orders of the defined types were placed in this encounter.   Patient Instructions  Medication Instructions:  No  changes  *If you need a refill on your cardiac medications before your next appointment, please call your pharmacy*   Lab Work: Not needed If you have labs (blood work) drawn today and your tests are completely normal, you will receive your results only by: Marland Kitchen MyChart Message (if you have MyChart) OR . A paper copy in the mail If you have any lab test that is abnormal or we need to change your treatment, we will call you to review the results.   Testing/Procedures: Not needed   Follow-Up: At [facility], you and your health needs are our priority.  As part of our continuing mission to provide you with exceptional heart care, we have created designated Provider Care Teams.  These Care Teams include your primary Cardiologist (physician) and Advanced Practice Providers (APPs -  Physician Assistants and Nurse Practitioners) who all work together to provide you with the care you need, when you need it.  We recommend signing up for the patient portal called "MyChart".  Sign up information is provided on this After Visit Summary.  MyChart is used to connect with patients for Virtual Visits (Telemedicine).  Patients are able to view lab/test results, encounter notes, upcoming appointments, etc.  Non-urgent messages can be sent to your provider as well.   To learn more about what you can do with MyChart, go to [email or web address].    Your next appointment:   2 month(s)  The format for your next appointment:   In Person  Provider:   Cherlynn Kaiser, MD   Other Instructions n/a

## 2019-11-15 NOTE — Patient Instructions (Signed)
Medication Instructions:  No  changes  *If you need a refill on your cardiac medications before your next appointment, please call your pharmacy*   Lab Work: Not needed If you have labs (blood work) drawn today and your tests are completely normal, you will receive your results only by: Marland Kitchen MyChart Message (if you have MyChart) OR . A paper copy in the mail If you have any lab test that is abnormal or we need to change your treatment, we will call you to review the results.   Testing/Procedures: Not needed   Follow-Up: At [facility], you and your health needs are our priority.  As part of our continuing mission to provide you with exceptional heart care, we have created designated Provider Care Teams.  These Care Teams include your primary Cardiologist (physician) and Advanced Practice Providers (APPs -  Physician Assistants and Nurse Practitioners) who all work together to provide you with the care you need, when you need it.  We recommend signing up for the patient portal called "MyChart".  Sign up information is provided on this After Visit Summary.  MyChart is used to connect with patients for Virtual Visits (Telemedicine).  Patients are able to view lab/test results, encounter notes, upcoming appointments, etc.  Non-urgent messages can be sent to your provider as well.   To learn more about what you can do with MyChart, go to [email or web address].    Your next appointment:   2 month(s)  The format for your next appointment:   In Person  Provider:   Cherlynn Kaiser, MD   Other Instructions n/a

## 2019-11-18 ENCOUNTER — Other Ambulatory Visit: Payer: Self-pay | Admitting: Cardiothoracic Surgery

## 2019-11-18 DIAGNOSIS — Z951 Presence of aortocoronary bypass graft: Secondary | ICD-10-CM

## 2019-11-19 ENCOUNTER — Other Ambulatory Visit: Payer: Self-pay

## 2019-11-19 ENCOUNTER — Ambulatory Visit
Admission: RE | Admit: 2019-11-19 | Discharge: 2019-11-19 | Disposition: A | Discharge status: Home / Self Care | Payer: PPO | Source: Ambulatory Visit | Attending: Thoracic Surgery (Cardiothoracic Vascular Surgery) | Admitting: Thoracic Surgery (Cardiothoracic Vascular Surgery)

## 2019-11-19 ENCOUNTER — Encounter: Payer: Self-pay | Admitting: Cardiothoracic Surgery

## 2019-11-19 ENCOUNTER — Ambulatory Visit (INDEPENDENT_AMBULATORY_CARE_PROVIDER_SITE_OTHER): Payer: Self-pay | Admitting: Cardiothoracic Surgery

## 2019-11-19 DIAGNOSIS — Z951 Presence of aortocoronary bypass graft: Secondary | ICD-10-CM | POA: Diagnosis not present

## 2019-11-19 DIAGNOSIS — Z09 Encounter for follow-up examination after completed treatment for conditions other than malignant neoplasm: Secondary | ICD-10-CM

## 2019-11-19 HISTORY — DX: Encounter for follow-up examination after completed treatment for conditions other than malignant neoplasm: Z09

## 2019-11-19 NOTE — Progress Notes (Signed)
PCP is Townsend Roger, MD Referring Provider is Lorretta Harp, MD  Chief Complaint  Patient presents with  . Routine Post Op    s/p CABG 10/21/19, CXR today    HPI: The patient returns for scheduled follow-up 3 weeks after CABG x3 for unstable angina.  Left IMA to LAD, radial artery to OM and vein graft to posterior descending.  Patient has done well.  He denies any problems with angina.  No shortness of breath.  I personally reviewed the chest x-ray he had today which shows clear lung fields no pleural effusion and stable cardiac silhouette and stable sternal wires.  The patient is having some numbness and some weakness in the middle 2 fingers of his left hand related to the left radial artery harvest.  This is improving and he will try some stretching and strengthening exercises as instructed.  If he does not notice improvement he will call for referral to outpatient physical therapy at [facility] for left hand PT Past Medical History:  Diagnosis Date  . Anxiety   . Cancer [facility])    prostate  . Coronary artery disease   . GERD (gastroesophageal reflux disease)   . Headache   . Hyperlipidemia   . Hypertension     Past Surgical History:  Procedure Laterality Date  . CORONARY ARTERY BYPASS GRAFT N/A 10/21/2019   Procedure: CORONARY ARTERY BYPASS GRAFTING (CABG) x three, using left internal mammary artery, left radial artery harvested endoscopically and right greater saphenous vein harvested endoscopically;  Surgeon: Ivin Poot, MD;  Location: [place];  Service: Open Heart Surgery;  Laterality: N/A;  . LEFT HEART CATH AND CORONARY ANGIOGRAPHY N/A 10/14/2019   Procedure: LEFT HEART CATH AND CORONARY ANGIOGRAPHY;  Surgeon: Lorretta Harp, MD;  Location: [place] CV LAB;  Service: Cardiovascular;  Laterality: N/A;  . PROSTATECTOMY    . RADIAL ARTERY HARVEST Left 10/21/2019   Procedure: RIGHT RADIAL ARTERY HARVESTED ENDOSCOPICALLY;  Surgeon: Ivin Poot, MD;  Location:  [place];  Service: Open Heart Surgery;  Laterality: Left;  . TEE WITHOUT CARDIOVERSION N/A 10/21/2019   Procedure: TRANSESOPHAGEAL ECHOCARDIOGRAM (TEE);  Surgeon: Prescott Gum, Collier Salina, MD;  Location: [place];  Service: Open Heart Surgery;  Laterality: N/A;    Family History  Problem Relation Age of Onset  . Heart attack Father     Social History Social History   Tobacco Use  . Smoking status: Never Smoker  . Smokeless tobacco: Never Used  Substance Use Topics  . Alcohol use: Not Currently  . Drug use: Never    Current Outpatient Medications  Medication Sig Dispense Refill  . ALPRAZolam (XANAX) 0.5 MG tablet Take 0.5 mg by mouth at bedtime.     Marland Kitchen aspirin EC 325 MG EC tablet Take 1 tablet (325 mg total) by mouth daily. 30 tablet 0  . atorvastatin (LIPITOR) 40 MG tablet Take 1 tablet (40 mg total) by mouth every other day. Lunch 90 tablet 3  . isosorbide mononitrate (IMDUR) 30 MG 24 hr tablet Take 0.5 tablets (15 mg total) by mouth daily. 15 tablet 0  . lisinopril-hydrochlorothiazide (ZESTORETIC) 20-12.5 MG tablet Take 1 tablet by mouth in the morning and at bedtime. Pt taking 10 mg bid    . metoprolol tartrate (LOPRESSOR) 25 MG tablet Take 0.5 tablets (12.5 mg total) by mouth 2 (two) times daily. 30 tablet 1  . omeprazole (PRILOSEC) 40 MG capsule Take 40 mg by mouth daily.    . vitamin B-12 (  CYANOCOBALAMIN) 1000 MCG tablet Take 1,000 mcg by mouth daily.    Marland Kitchen oxyCODONE (OXY IR/ROXICODONE) 5 MG immediate release tablet Take 1 tablet (5 mg total) by mouth every 6 (six) hours as needed for severe pain. (Patient not taking: Reported on 11/19/2019) 30 tablet 0   No current facility-administered medications for this visit.    Allergies  Allergen Reactions  . Imdur [Isosorbide Nitrate] Other (See Comments)    " headache"    Review of Systems  Better appetite and strength No fever No edema Has noted polyuria which  he relates to increased dose of Lipitor  BP 112/66 (BP Location: Right Arm,  Patient Position: Sitting, Cuff Size: Normal)   Pulse 64   Temp 97.7 F (36.5 C) (Temporal)   Resp 20   Ht 5\' 5"  (1.651 m)   Wt 170 lb (77.1 kg)   SpO2 100% Comment: RA  BMI 28.29 kg/m  Physical Exam      Exam    General- alert and comfortable.  Sternal incision well-healed.    Neck- no JVD, no cervical adenopathy palpable, no carotid bruit   Lungs- clear without rales, wheezes   Cor- regular rate and rhythm, no murmur , gallop   Abdomen- soft, non-tender   Extremities - warm, non-tender, minimal edema   Neuro- oriented, appropriate, no focal weakness   Diagnostic Tests: Today's chest x-ray personally reviewed and is clear  Impression: Doing well 3 weeks after CABG x3 Patient may drive and lift up to 10 pounds maximum until his next visit The patient will stop his Imdur when the prescription runs out and continue his other medications Plan: We discussed the importance of heart healthy lifestyle including 20 to 30 minutes of walking 5 days a week and heart healthy diet.  He understands not to lift more than 10 pounds until next visit.  Len Childs, MD [facility] [phone]

## 2019-11-20 ENCOUNTER — Ambulatory Visit: Payer: PPO | Admitting: Cardiothoracic Surgery

## 2019-11-25 DIAGNOSIS — Z006 Encounter for examination for normal comparison and control in clinical research program: Secondary | ICD-10-CM

## 2019-11-25 NOTE — Research (Signed)
[place] Study  Observational Arm  Patient doing well at this time, no adverse events. Will follow up with patient at 90 days post op.   Current Outpatient Medications:  .  ALPRAZolam (XANAX) 0.5 MG tablet, Take 0.5 mg by mouth at bedtime. , Disp: , Rfl:  .  aspirin EC 325 MG EC tablet, Take 1 tablet (325 mg total) by mouth daily., Disp: 30 tablet, Rfl: 0 .  atorvastatin (LIPITOR) 40 MG tablet, Take 1 tablet (40 mg total) by mouth every other day. Lunch, Disp: 90 tablet, Rfl: 3 .  lisinopril-hydrochlorothiazide (ZESTORETIC) 20-12.5 MG tablet, Take 1 tablet by mouth in the morning and at bedtime. Pt taking 10 mg bid, Disp: , Rfl:  .  metoprolol tartrate (LOPRESSOR) 25 MG tablet, Take 0.5 tablets (12.5 mg total) by mouth 2 (two) times daily., Disp: 30 tablet, Rfl: 1 .  omeprazole (PRILOSEC) 40 MG capsule, Take 40 mg by mouth daily., Disp: , Rfl:  .  oxyCODONE (OXY IR/ROXICODONE) 5 MG immediate release tablet, Take 1 tablet (5 mg total) by mouth every 6 (six) hours as needed for severe pain., Disp: 30 tablet, Rfl: 0 .  vitamin B-12 (CYANOCOBALAMIN) 1000 MCG tablet, Take 1,000 mcg by mouth daily., Disp: , Rfl:

## 2019-11-28 DIAGNOSIS — I10 Essential (primary) hypertension: Secondary | ICD-10-CM | POA: Diagnosis not present

## 2019-11-28 DIAGNOSIS — Z8546 Personal history of malignant neoplasm of prostate: Secondary | ICD-10-CM | POA: Diagnosis not present

## 2019-11-28 DIAGNOSIS — Z6828 Body mass index (BMI) 28.0-28.9, adult: Secondary | ICD-10-CM | POA: Diagnosis not present

## 2019-11-28 DIAGNOSIS — K219 Gastro-esophageal reflux disease without esophagitis: Secondary | ICD-10-CM | POA: Diagnosis not present

## 2019-11-28 DIAGNOSIS — Z1389 Encounter for screening for other disorder: Secondary | ICD-10-CM | POA: Diagnosis not present

## 2019-11-28 DIAGNOSIS — Z0000 Encounter for general adult medical examination without abnormal findings: Secondary | ICD-10-CM | POA: Diagnosis not present

## 2019-11-28 DIAGNOSIS — I25810 Atherosclerosis of coronary artery bypass graft(s) without angina pectoris: Secondary | ICD-10-CM | POA: Diagnosis not present

## 2019-11-28 DIAGNOSIS — E7800 Pure hypercholesterolemia, unspecified: Secondary | ICD-10-CM | POA: Diagnosis not present

## 2019-12-10 DIAGNOSIS — D649 Anemia, unspecified: Secondary | ICD-10-CM | POA: Diagnosis not present

## 2019-12-23 ENCOUNTER — Other Ambulatory Visit: Payer: Self-pay

## 2019-12-23 ENCOUNTER — Encounter: Payer: Self-pay | Admitting: Cardiology

## 2019-12-23 ENCOUNTER — Ambulatory Visit: Payer: PPO | Admitting: Cardiology

## 2019-12-23 ENCOUNTER — Telehealth: Payer: Self-pay | Admitting: Internal Medicine

## 2019-12-23 VITALS — BP 160/86 | HR 61 | Ht 65.0 in | Wt 173.0 lb

## 2019-12-23 DIAGNOSIS — E785 Hyperlipidemia, unspecified: Secondary | ICD-10-CM

## 2019-12-23 DIAGNOSIS — I10 Essential (primary) hypertension: Secondary | ICD-10-CM

## 2019-12-23 DIAGNOSIS — I2510 Atherosclerotic heart disease of native coronary artery without angina pectoris: Secondary | ICD-10-CM

## 2019-12-23 DIAGNOSIS — Z951 Presence of aortocoronary bypass graft: Secondary | ICD-10-CM | POA: Diagnosis not present

## 2019-12-23 DIAGNOSIS — R079 Chest pain, unspecified: Secondary | ICD-10-CM

## 2019-12-23 MED ORDER — NITROGLYCERIN 0.4 MG SL SUBL: 0.4000 mg | SUBLINGUAL_TABLET | SUBLINGUAL | 3 refills | Status: DC | PRN

## 2019-12-23 MED ORDER — CARVEDILOL 6.25 MG PO TABS: 6.2500 mg | ORAL_TABLET | Freq: Two times a day (BID) | ORAL | 3 refills | Status: DC

## 2019-12-23 NOTE — Patient Instructions (Addendum)
Medication Instructions:  STOP metoprolol START carvedilol (Coreg) 6.25 mg two times daily Take sublingual nitroglycerin AS NEEDED for chest pain  *If you need a refill on your cardiac medications before your next appointment, please call your pharmacy*   Testing/Procedures: Your physician has requested that you have a lexiscan myoview ASAP. For further information please visit [email or web address]. Please follow instruction sheet, as given.  Your physician has requested that you have an echocardiogram. Echocardiography is a painless test that uses sound waves to create images of your heart. It provides your doctor with information about the size and shape of your heart and how well your heart's chambers and valves are working. This procedure takes approximately one hour. There are no restrictions for this procedure. This will be done at our [facility] location:  [place]: At [employer], you and your health needs are our priority.  As part of our continuing mission to provide you with exceptional heart care, we have created designated Provider Care Teams.  These Care Teams include your primary Cardiologist (physician) and Advanced Practice Providers (APPs -  Physician Assistants and Nurse Practitioners) who all work together to provide you with the care you need, when you need it.  We recommend signing up for the patient portal called "MyChart".  Sign up information is provided on this After Visit Summary.  MyChart is used to connect with patients for Virtual Visits (Telemedicine).  Patients are able to view lab/test results, encounter notes, upcoming appointments, etc.  Non-urgent messages can be sent to your provider as well.   To learn more about what you can do with MyChart, go to [email or web address].    Your next appointment:    follow up with Dr. Prescott Gum and Dr. Margaretann Loveless as scheduled   Other Instructions Please check your blood pressure and  heart rate at home daily, write it down.  Call the office of send message via Mychart with the readings in 2 weeks for Dr. Gardiner Rhyme to review.

## 2019-12-23 NOTE — Telephone Encounter (Signed)
Pookela is calling requesting a nurse give him a call to discuss something he has going on with him. He states he will go into greater detail when he receives call. Please advise.

## 2019-12-23 NOTE — Progress Notes (Signed)
l °

## 2019-12-23 NOTE — Progress Notes (Signed)
Cardiology Office Note:    Date:  12/23/2019   ID:  Rodney Young, DOB [birth year], MRN [id number]  PCP:  Rodney Roger, MD  Cardiologist:  Rodney Munroe, MD  Electrophysiologist:  None   Referring MD: Rodney Roger, MD   Chief Complaint  Patient presents with  . Chest Pain    History of Present Illness:    Rodney Young is a 64 y.o. male with a hx of CAD status post CABG (LIMA-LAD, left radial-OM, SVG-PDA) on 10/21/19, hypertension, hyperlipidemia who presents as a follow-up visit for chest pain.  He reports that he started having chest pain 2 to 3 weeks ago.  Describes as both a tightness and burning on left side of chest.  Occurs daily.  It lasts all day when it comes on.  States that he wakes up in the morning without any pain, but by the afternoon has the pain on the left side of his chest.  Reports pain is worse with deep breathing.  Has not noted relationship with exertion.  States that he is able to walk up the hill by his home without any chest pain.  He called our office this morning concerning his chest pain and was recommended to go to the ED for evaluation, but he is adamant about not going to the ED for evaluation.    Past Medical History:  Diagnosis Date  . Anxiety   . Cancer [facility])    prostate  . Coronary artery disease   . GERD (gastroesophageal reflux disease)   . Headache   . Hyperlipidemia   . Hypertension     Past Surgical History:  Procedure Laterality Date  . CORONARY ARTERY BYPASS GRAFT N/A 10/21/2019   Procedure: CORONARY ARTERY BYPASS GRAFTING (CABG) x three, using left internal mammary artery, left radial artery harvested endoscopically and right greater saphenous vein harvested endoscopically;  Surgeon: Rodney Poot, MD;  Location: [place];  Service: Open Heart Surgery;  Laterality: N/A;  . LEFT HEART CATH AND CORONARY ANGIOGRAPHY N/A 10/14/2019   Procedure: LEFT HEART CATH AND CORONARY ANGIOGRAPHY;  Surgeon: Rodney Harp, MD;  Location:  [place] CV LAB;  Service: Cardiovascular;  Laterality: N/A;  . PROSTATECTOMY    . RADIAL ARTERY HARVEST Left 10/21/2019   Procedure: RIGHT RADIAL ARTERY HARVESTED ENDOSCOPICALLY;  Surgeon: Rodney Poot, MD;  Location: [place];  Service: Open Heart Surgery;  Laterality: Left;  . TEE WITHOUT CARDIOVERSION N/A 10/21/2019   Procedure: TRANSESOPHAGEAL ECHOCARDIOGRAM (TEE);  Surgeon: Rodney Young, Rodney Salina, MD;  Location: [place];  Service: Open Heart Surgery;  Laterality: N/A;    Current Medications: Current Meds  Medication Sig  . ALPRAZolam (XANAX) 0.5 MG tablet Take 0.5 mg by mouth at bedtime.   Marland Kitchen aspirin EC 325 MG EC tablet Take 1 tablet (325 mg total) by mouth daily.  Marland Kitchen atorvastatin (LIPITOR) 40 MG tablet Take 1 tablet (40 mg total) by mouth every other day. Lunch  . lisinopril-hydrochlorothiazide (ZESTORETIC) 20-12.5 MG tablet Take 1 tablet by mouth in the morning and at bedtime. Pt taking 10 mg bid  . omeprazole (PRILOSEC) 40 MG capsule Take 40 mg by mouth daily.  . vitamin B-12 (CYANOCOBALAMIN) 1000 MCG tablet Take 1,000 mcg by mouth daily.  . [DISCONTINUED] metoprolol tartrate (LOPRESSOR) 25 MG tablet Take 0.5 tablets (12.5 mg total) by mouth 2 (two) times daily.  . [DISCONTINUED] oxyCODONE (OXY IR/ROXICODONE) 5 MG immediate release tablet Take 1 tablet (5 mg total) by mouth every 6 (six)  hours as needed for severe pain.     Allergies:   Imdur [isosorbide nitrate]   Social History   Socioeconomic History  . Marital status: Single    Spouse name: Not on file  . Number of children: Not on file  . Years of education: Not on file  . Highest education level: Not on file  Occupational History  . Not on file  Tobacco Use  . Smoking status: Never Smoker  . Smokeless tobacco: Never Used  Substance and Sexual Activity  . Alcohol use: Not Currently  . Drug use: Never  . Sexual activity: Not on file  Other Topics Concern  . Not on file  Social History Narrative  . Not on file    Social Determinants of Health   Financial Resource Strain:   . Difficulty of Paying Living Expenses:   Food Insecurity:   . Worried About Charity fundraiser in the Last Year:   . Arboriculturist in the Last Year:   Transportation Needs:   . Film/video editor (Medical):   Marland Kitchen Lack of Transportation (Non-Medical):   Physical Activity:   . Days of Exercise per Week:   . Minutes of Exercise per Session:   Stress:   . Feeling of Stress :   Social Connections:   . Frequency of Communication with Friends and Family:   . Frequency of Social Gatherings with Friends and Family:   . Attends Religious Services:   . Active Member of Clubs or Organizations:   . Attends Archivist Meetings:   Marland Kitchen Marital Status:      Family History: The patient's family history includes Heart attack in his father.  ROS:   Please see the history of present illness.    All other systems reviewed and are negative.  EKGs/Labs/Other Studies Reviewed:    The following studies were reviewed today:  EKG:  EKG is ordered today.  The ekg ordered today demonstrates normal sinus rhythm, rate 61, no ST/T abnormalities  Recent Labs: 10/22/2019: Magnesium 2.8 10/24/2019: ALT 48 11/01/2019: BUN 18; Creatinine, Ser 1.25; Hemoglobin 9.3; Platelets 453; Potassium 5.0; Sodium 138  Recent Lipid Panel No results found for: CHOL, TRIG, HDL, CHOLHDL, VLDL, LDLCALC, LDLDIRECT  Physical Exam:    VS:  BP (!) 160/86   Pulse 61   Ht '5\' 5"'$  (1.651 m)   Wt 173 lb (78.5 kg)   SpO2 96%   BMI 28.79 kg/m     Wt Readings from Last 3 Encounters:  12/23/19 173 lb (78.5 kg)  11/19/19 170 lb (77.1 kg)  11/15/19 171 lb 9.6 oz (77.8 kg)     GEN:  in no acute distress HEENT: Normal NECK: No JVD CARDIAC: RRR, no murmurs, rubs, gallops.  Sternal incision c/d/i RESPIRATORY:  Clear to auscultation without rales, wheezing or rhonchi  ABDOMEN: Soft, non-tender, non-distended MUSCULOSKELETAL:  No edema SKIN: Warm and  dry NEUROLOGIC:  Alert and oriented x 3 PSYCHIATRIC:  Normal affect   ASSESSMENT:    1. Chest pain of uncertain etiology   2. S/P CABG x 3   3. CAD in native artery   4. Essential hypertension   5. Hyperlipidemia, unspecified hyperlipidemia type    PLAN:     Chest pain: Atypical in description, as describes burning/tightness on left side of his chest not related to exertion and lasting for hours.  Suspect noncardiac pain, likely GERD or musculoskeletal pain.  Pericarditis also on differential given recent surgery and describes pleuritic  component to pain, though no friction rub on exam and no EKG evidence of pericarditis.  Ischemia appears less likely as pain is not related to exertion and EKG is unchanged from prior   -Lexiscan Myoview to rule out ischemia -TTE to evaluate for effusion and systolic function post-CABG -ESR/CRP -PPI  CAD: status post CABG (LIMA-LAD, left radial-OM, SVG-PDA) on 10/21/19 -Continue aspirin 325 mg  -Continue atorvastatin 40 mg daily -On metoprolol 12.5 mg twice daily.  BP elevated, will switch to carvedilol 6.25 mg twice daily -Sublingual nitroglycerin as needed -Plan Lexiscan Myoview and TTE as above  Hypertension: On lisinopril-hydrochlorothiazide 20-12.5 mg daily and metoprolol 12.5 mg twice daily.  BP elevated, will switch from metoprolol to carvedilol 6.25 mg twice daily as above  Hyperlipidemia: On atorvastatin 40 mg.  LDL 107 on 05/24/19.  Will repeat lipid panel  RTC in 1 month, has appointment scheduled with Dr Margaretann Loveless   Medication Adjustments/Labs and Tests Ordered: Current medicines are reviewed at length with the patient today.  Concerns regarding medicines are outlined above.  Orders Placed This Encounter  Procedures  . Basic metabolic panel  . CBC  . Sedimentation rate  . C-reactive protein  . MYOCARDIAL PERFUSION IMAGING  . EKG 12-Lead  . ECHOCARDIOGRAM COMPLETE   Meds ordered this encounter  Medications  . carvedilol (COREG)  6.25 MG tablet    Sig: Take 1 tablet (6.25 mg total) by mouth 2 (two) times daily.    Dispense:  180 tablet    Refill:  3  . nitroGLYCERIN (NITROSTAT) 0.4 MG SL tablet    Sig: Place 1 tablet (0.4 mg total) under the tongue every 5 (five) minutes as needed for chest pain.    Dispense:  25 tablet    Refill:  3    Patient Instructions  Medication Instructions:  STOP metoprolol START carvedilol (Coreg) 6.25 mg two times daily Take sublingual nitroglycerin AS NEEDED for chest pain  *If you need a refill on your cardiac medications before your next appointment, please call your pharmacy*   Testing/Procedures: Your physician has requested that you have a lexiscan myoview ASAP. For further information please visit [email or web address]. Please follow instruction sheet, as given.  Your physician has requested that you have an echocardiogram. Echocardiography is a painless test that uses sound waves to create images of your heart. It provides your doctor with information about the size and shape of your heart and how well your heart's chambers and valves are working. This procedure takes approximately one hour. There are no restrictions for this procedure. This will be done at our [facility] location:  [place]: At [employer], you and your health needs are our priority.  As part of our continuing mission to provide you with exceptional heart care, we have created designated Provider Care Teams.  These Care Teams include your primary Cardiologist (physician) and Advanced Practice Providers (APPs -  Physician Assistants and Nurse Practitioners) who all work together to provide you with the care you need, when you need it.  We recommend signing up for the patient portal called "MyChart".  Sign up information is provided on this After Visit Summary.  MyChart is used to connect with patients for Virtual Visits (Telemedicine).  Patients are able to view lab/test  results, encounter notes, upcoming appointments, etc.  Non-urgent messages can be sent to your provider as well.   To learn more about what you can do with MyChart, go to [email or web address].  Your next appointment:    follow up with Dr. Prescott Young and Dr. Margaretann Loveless as scheduled   Other Instructions Please check your blood pressure and heart rate at home daily, write it down.  Call the office of send message via Mychart with the readings in 2 weeks for Dr. Gardiner Rhyme to review.       Signed, Donato Heinz, MD  12/23/2019 4:42 PM    [place]

## 2019-12-23 NOTE — Telephone Encounter (Signed)
Pt is calling in reporting that when he wakes up in the morning he feels good but later throughout the day he begins feeling "like a ton of bricks is sitting on his chest". He reports having a lot of indigestion so he originally thought this pain was coming from that but it has been getting progressively worse. He states that yesterday was the worst day yet.  He reports the pain mostly being in the left side of his chest and sometimes goes to his right side of chest as well. It is worse on exertion. Pt denies SOB, swelling, N/V.  The pain does not get better with best. The patient does not have Nitroglycerin and is unable to take his BP/HR at this time. I advised the patient to go to the ED to be evaluated immediately. The patient is adamant that he is not comfortable going to the ED because he is not having chest pain currently and he will wait for his appointment next week with his surgeon. I reiterated the importance of being evaluated in person as soon as possible. I scheduled the patient in office with Dr. Gardiner Rhyme this afternoon as patient is refusing the ED at this time. He is aware of the risks and I advised him to call EMS if his CP comes back.

## 2019-12-24 LAB — LIPID PANEL
Chol/HDL Ratio: 3.7 ratio (ref 0.0–5.0)
Cholesterol, Total: 162 mg/dL (ref 100–199)
HDL: 44 mg/dL (ref >39)
LDL Chol Calc (NIH): 101 mg/dL — ABNORMAL HIGH (ref 0–99)
Triglycerides: 90 mg/dL (ref 0–149)
VLDL Cholesterol Cal: 17 mg/dL (ref 5–40)

## 2019-12-24 LAB — CBC
Hematocrit: 37.6 % (ref 37.5–51.0)
Hemoglobin: 11.8 g/dL — ABNORMAL LOW (ref 13.0–17.7)
MCH: 25.1 pg — ABNORMAL LOW (ref 26.6–33.0)
MCHC: 31.4 g/dL — ABNORMAL LOW (ref 31.5–35.7)
MCV: 80 fL (ref 79–97)
Platelets: 217 10*3/uL (ref 150–450)
RBC: 4.71 x10E6/uL (ref 4.14–5.80)
RDW: 13.8 % (ref 11.6–15.4)
WBC: 9 10*3/uL (ref 3.4–10.8)

## 2019-12-24 LAB — BASIC METABOLIC PANEL
BUN/Creatinine Ratio: 12 (ref 10–24)
BUN: 16 mg/dL (ref 8–27)
CO2: 26 mmol/L (ref 20–29)
Calcium: 9.3 mg/dL (ref 8.6–10.2)
Chloride: 100 mmol/L (ref 96–106)
Creatinine, Ser: 1.35 mg/dL — ABNORMAL HIGH (ref 0.76–1.27)
GFR calc Af Amer: 63 mL/min/{1.73_m2} (ref >59)
GFR calc non Af Amer: 55 mL/min/{1.73_m2} — ABNORMAL LOW (ref >59)
Glucose: 83 mg/dL (ref 65–99)
Potassium: 4.1 mmol/L (ref 3.5–5.2)
Sodium: 142 mmol/L (ref 134–144)

## 2019-12-24 LAB — HEPATIC FUNCTION PANEL
ALT: 19 IU/L (ref 0–44)
AST: 17 IU/L (ref 0–40)
Albumin: 4.4 g/dL (ref 3.8–4.8)
Alkaline Phosphatase: 74 IU/L (ref 48–121)
Bilirubin Total: 0.3 mg/dL (ref 0.0–1.2)
Bilirubin, Direct: 0.13 mg/dL (ref 0.00–0.40)
Total Protein: 6.7 g/dL (ref 6.0–8.5)

## 2019-12-24 LAB — SEDIMENTATION RATE: Sed Rate: 9 mm/hr (ref 0–30)

## 2019-12-24 LAB — C-REACTIVE PROTEIN: CRP: 47 mg/L — ABNORMAL HIGH (ref 0–10)

## 2019-12-25 ENCOUNTER — Other Ambulatory Visit: Payer: Self-pay

## 2019-12-25 ENCOUNTER — Observation Stay (HOSPITAL_COMMUNITY)
Admission: EM | Admit: 2019-12-25 | Discharge: 2019-12-26 | Discharge status: Against Medical Advice | Payer: PPO | Attending: Family Medicine | Admitting: Family Medicine

## 2019-12-25 ENCOUNTER — Emergency Department (HOSPITAL_COMMUNITY): Payer: PPO

## 2019-12-25 ENCOUNTER — Encounter (HOSPITAL_COMMUNITY): Payer: Self-pay

## 2019-12-25 DIAGNOSIS — R0902 Hypoxemia: Secondary | ICD-10-CM | POA: Diagnosis not present

## 2019-12-25 DIAGNOSIS — Z8249 Family history of ischemic heart disease and other diseases of the circulatory system: Secondary | ICD-10-CM | POA: Diagnosis not present

## 2019-12-25 DIAGNOSIS — N183 Chronic kidney disease, stage 3 unspecified: Secondary | ICD-10-CM | POA: Diagnosis not present

## 2019-12-25 DIAGNOSIS — R4020 Unspecified coma: Secondary | ICD-10-CM | POA: Diagnosis not present

## 2019-12-25 DIAGNOSIS — Z79899 Other long term (current) drug therapy: Secondary | ICD-10-CM | POA: Insufficient documentation

## 2019-12-25 DIAGNOSIS — I129 Hypertensive chronic kidney disease with stage 1 through stage 4 chronic kidney disease, or unspecified chronic kidney disease: Secondary | ICD-10-CM | POA: Diagnosis not present

## 2019-12-25 DIAGNOSIS — Z8546 Personal history of malignant neoplasm of prostate: Secondary | ICD-10-CM | POA: Diagnosis not present

## 2019-12-25 DIAGNOSIS — Z5329 Procedure and treatment not carried out because of patient's decision for other reasons: Secondary | ICD-10-CM | POA: Insufficient documentation

## 2019-12-25 DIAGNOSIS — I491 Atrial premature depolarization: Secondary | ICD-10-CM | POA: Diagnosis not present

## 2019-12-25 DIAGNOSIS — I2510 Atherosclerotic heart disease of native coronary artery without angina pectoris: Secondary | ICD-10-CM | POA: Diagnosis not present

## 2019-12-25 DIAGNOSIS — R0789 Other chest pain: Secondary | ICD-10-CM | POA: Diagnosis not present

## 2019-12-25 DIAGNOSIS — I10 Essential (primary) hypertension: Secondary | ICD-10-CM

## 2019-12-25 DIAGNOSIS — I4510 Unspecified right bundle-branch block: Secondary | ICD-10-CM | POA: Insufficient documentation

## 2019-12-25 DIAGNOSIS — K219 Gastro-esophageal reflux disease without esophagitis: Secondary | ICD-10-CM | POA: Diagnosis not present

## 2019-12-25 DIAGNOSIS — R079 Chest pain, unspecified: Secondary | ICD-10-CM | POA: Diagnosis not present

## 2019-12-25 DIAGNOSIS — Z66 Do not resuscitate: Secondary | ICD-10-CM | POA: Insufficient documentation

## 2019-12-25 DIAGNOSIS — E785 Hyperlipidemia, unspecified: Secondary | ICD-10-CM

## 2019-12-25 DIAGNOSIS — F419 Anxiety disorder, unspecified: Secondary | ICD-10-CM | POA: Insufficient documentation

## 2019-12-25 DIAGNOSIS — Z951 Presence of aortocoronary bypass graft: Secondary | ICD-10-CM | POA: Diagnosis not present

## 2019-12-25 DIAGNOSIS — Z7982 Long term (current) use of aspirin: Secondary | ICD-10-CM | POA: Diagnosis not present

## 2019-12-25 DIAGNOSIS — Z20822 Contact with and (suspected) exposure to covid-19: Secondary | ICD-10-CM | POA: Insufficient documentation

## 2019-12-25 DIAGNOSIS — R55 Syncope and collapse: Secondary | ICD-10-CM

## 2019-12-25 HISTORY — DX: Syncope and collapse: R55

## 2019-12-25 LAB — CBC WITH DIFFERENTIAL/PLATELET
Abs Immature Granulocytes: 0.04 10*3/uL (ref 0.00–0.07)
Basophils Absolute: 0 10*3/uL (ref 0.0–0.1)
Basophils Relative: 0 %
Eosinophils Absolute: 0.1 10*3/uL (ref 0.0–0.5)
Eosinophils Relative: 1 %
HCT: 37.1 % — ABNORMAL LOW (ref 39.0–52.0)
Hemoglobin: 11.1 g/dL — ABNORMAL LOW (ref 13.0–17.0)
Immature Granulocytes: 0 %
Lymphocytes Relative: 11 %
Lymphs Abs: 1.3 10*3/uL (ref 0.7–4.0)
MCH: 24.9 pg — ABNORMAL LOW (ref 26.0–34.0)
MCHC: 29.9 g/dL — ABNORMAL LOW (ref 30.0–36.0)
MCV: 83.4 fL (ref 80.0–100.0)
Monocytes Absolute: 1.2 10*3/uL — ABNORMAL HIGH (ref 0.1–1.0)
Monocytes Relative: 10 %
Neutro Abs: 9.3 10*3/uL — ABNORMAL HIGH (ref 1.7–7.7)
Neutrophils Relative %: 78 %
Platelets: 195 10*3/uL (ref 150–400)
RBC: 4.45 MIL/uL (ref 4.22–5.81)
RDW: 14.7 % (ref 11.5–15.5)
WBC: 11.9 10*3/uL — ABNORMAL HIGH (ref 4.0–10.5)
nRBC: 0 % (ref 0.0–0.2)

## 2019-12-25 LAB — COMPREHENSIVE METABOLIC PANEL
ALT: 20 U/L (ref 0–44)
AST: 17 U/L (ref 15–41)
Albumin: 3.3 g/dL — ABNORMAL LOW (ref 3.5–5.0)
Alkaline Phosphatase: 58 U/L (ref 38–126)
Anion gap: 10 (ref 5–15)
BUN: 25 mg/dL — ABNORMAL HIGH (ref 8–23)
CO2: 26 mmol/L (ref 22–32)
Calcium: 8.9 mg/dL (ref 8.9–10.3)
Chloride: 103 mmol/L (ref 98–111)
Creatinine, Ser: 1.56 mg/dL — ABNORMAL HIGH (ref 0.61–1.24)
GFR calc Af Amer: 53 mL/min — ABNORMAL LOW (ref >60)
GFR calc non Af Amer: 46 mL/min — ABNORMAL LOW (ref >60)
Glucose, Bld: 117 mg/dL — ABNORMAL HIGH (ref 70–99)
Potassium: 4.2 mmol/L (ref 3.5–5.1)
Sodium: 139 mmol/L (ref 135–145)
Total Bilirubin: 1 mg/dL (ref 0.3–1.2)
Total Protein: 6.3 g/dL — ABNORMAL LOW (ref 6.5–8.1)

## 2019-12-25 LAB — CBG MONITORING, ED: Glucose-Capillary: 104 mg/dL — ABNORMAL HIGH (ref 70–99)

## 2019-12-25 LAB — MAGNESIUM: Magnesium: 2 mg/dL (ref 1.7–2.4)

## 2019-12-25 LAB — SARS CORONAVIRUS 2 BY RT PCR (HOSPITAL ORDER, PERFORMED IN ~~LOC~~ HOSPITAL LAB): SARS Coronavirus 2: NEGATIVE

## 2019-12-25 LAB — TROPONIN I (HIGH SENSITIVITY)
Troponin I (High Sensitivity): 14 ng/L (ref <18)
Troponin I (High Sensitivity): 12 ng/L (ref <18)

## 2019-12-25 MED ORDER — ATORVASTATIN CALCIUM 40 MG PO TABS
40.0000 mg | ORAL_TABLET | ORAL | Status: DC
  Administered 2019-12-26: 40 mg via ORAL
  Filled 2019-12-25: qty 1

## 2019-12-25 MED ORDER — ASPIRIN EC 325 MG PO TBEC
325.0000 mg | DELAYED_RELEASE_TABLET | Freq: Every day | ORAL | Status: DC
  Administered 2019-12-26: 325 mg via ORAL
  Filled 2019-12-25: qty 1

## 2019-12-25 MED ORDER — PANTOPRAZOLE SODIUM 40 MG PO TBEC
40.0000 mg | DELAYED_RELEASE_TABLET | Freq: Every day | ORAL | Status: DC
  Administered 2019-12-26: 40 mg via ORAL
  Filled 2019-12-25: qty 1

## 2019-12-25 MED ORDER — SODIUM CHLORIDE 0.9 % IV SOLN: INTRAVENOUS | Status: AC

## 2019-12-25 MED ORDER — SODIUM CHLORIDE 0.9% FLUSH
3.0000 mL | Freq: Two times a day (BID) | INTRAVENOUS | Status: DC
  Administered 2019-12-25 – 2019-12-26 (×2): 3 mL via INTRAVENOUS

## 2019-12-25 MED ORDER — ACETAMINOPHEN 650 MG RE SUPP: 650.0000 mg | Freq: Four times a day (QID) | RECTAL | Status: DC | PRN

## 2019-12-25 MED ORDER — SODIUM CHLORIDE 0.9 % IV SOLN: INTRAVENOUS | Status: DC

## 2019-12-25 MED ORDER — ACETAMINOPHEN 325 MG PO TABS: 650.0000 mg | ORAL_TABLET | Freq: Four times a day (QID) | ORAL | Status: DC | PRN

## 2019-12-25 MED ORDER — ENOXAPARIN SODIUM 40 MG/0.4ML ~~LOC~~ SOLN
40.0000 mg | SUBCUTANEOUS | Status: DC
  Administered 2019-12-25: 40 mg via SUBCUTANEOUS
  Filled 2019-12-25: qty 0.4

## 2019-12-25 NOTE — ED Provider Notes (Signed)
[place] EMERGENCY DEPARTMENT Provider Note   CSN: [id number] Arrival date & time: 12/25/19  1555     History Chief Complaint  Patient presents with  . Hypotension  . Loss of Consciousness    Rodney Young is a 64 y.o. male.  Patient brought in by EMS.  Patient had a syncopal episode at [facility].  Patient status post cardiac bypass surgery on March 22.  Patient was sitting on a bench at [facility] waiting for his prescriptions he started to feel cold sweaty and felt dizzy and next thing he remembers he was laying on the bench.  Witnesses said he passed out.  Initial blood pressure was 68/39 patient denies any chest pain or shortness of breath.  But has been having some intermittent right-sided chest pain.  Last seen by cardiology on May 24.  And they are planning outpatient echo and Myoview scan.  According to cardiology note patient is EKGs and not shown any abnormalities.  Past medical history otherwise significant hypertension hyperlipidemia gastroesophageal reflux disease.  No nausea or vomiting.  No upper respiratory symptoms.  No headaches.  No evidence of any seizure activity.  Patient had his first Covid vaccine [place] on May 14 and has not been feeling well since he received that.  Patient without any chest pain currently did have some earlier today but did not have any right prior to the syncopal episode.  Patient states he feels fine now.  Patient did not hit his head.  Did not fall all the way to the floor.        Past Medical History:  Diagnosis Date  . Anxiety   . Cancer [facility])    prostate  . Coronary artery disease   . GERD (gastroesophageal reflux disease)   . Headache   . Hyperlipidemia   . Hypertension     Patient Active Problem List   Diagnosis Date Noted  . Postop check 11/19/2019  . 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 CAD (coronary artery disease) 10/16/2019  . Abnormal stress test 10/14/2019    Past Surgical History:  Procedure Laterality  Date  . CORONARY ARTERY BYPASS GRAFT N/A 10/21/2019   Procedure: CORONARY ARTERY BYPASS GRAFTING (CABG) x three, using left internal mammary artery, left radial artery harvested endoscopically and right greater saphenous vein harvested endoscopically;  Surgeon: Ivin Poot, MD;  Location: [place];  Service: Open Heart Surgery;  Laterality: N/A;  . LEFT HEART CATH AND CORONARY ANGIOGRAPHY N/A 10/14/2019   Procedure: LEFT HEART CATH AND CORONARY ANGIOGRAPHY;  Surgeon: Lorretta Harp, MD;  Location: [place] CV LAB;  Service: Cardiovascular;  Laterality: N/A;  . PROSTATECTOMY    . RADIAL ARTERY HARVEST Left 10/21/2019   Procedure: RIGHT RADIAL ARTERY HARVESTED ENDOSCOPICALLY;  Surgeon: Ivin Poot, MD;  Location: [place];  Service: Open Heart Surgery;  Laterality: Left;  . TEE WITHOUT CARDIOVERSION N/A 10/21/2019   Procedure: TRANSESOPHAGEAL ECHOCARDIOGRAM (TEE);  Surgeon: Prescott Gum, Collier Salina, MD;  Location: [place];  Service: Open Heart Surgery;  Laterality: N/A;       Family History  Problem Relation Age of Onset  . Heart attack Father     Social History   Tobacco Use  . Smoking status: Never Smoker  . Smokeless tobacco: Never Used  Substance Use Topics  . Alcohol use: Not Currently  . Drug use: Never    Home Medications Prior to Admission medications   Medication Sig Start Date End Date Taking? Authorizing Provider  ALPRAZolam (XANAX) 0.5 MG tablet Take 0.5 mg by mouth at bedtime.  08/26/19   [provider]  aspirin EC 325 MG EC tablet Take 1 tablet (325 mg total) by mouth daily. 10/28/19   Elgie Collard, PA-C  atorvastatin (LIPITOR) 40 MG tablet Take 1 tablet (40 mg total) by mouth every other day. Lunch 11/01/19   Almyra Deforest, PA  carvedilol (COREG) 6.25 MG tablet Take 1 tablet (6.25 mg total) by mouth 2 (two) times daily. 12/23/19   Donato Heinz, MD  lisinopril-hydrochlorothiazide (ZESTORETIC) 20-12.5 MG tablet Take 1 tablet by mouth in the morning and at  bedtime. Pt taking 10 mg bid 08/19/19   [provider]  nitroGLYCERIN (NITROSTAT) 0.4 MG SL tablet Place 1 tablet (0.4 mg total) under the tongue every 5 (five) minutes as needed for chest pain. 12/23/19 03/22/20  Donato Heinz, MD  omeprazole (PRILOSEC) 40 MG capsule Take 40 mg by mouth daily. 08/26/19   [provider]  vitamin B-12 (CYANOCOBALAMIN) 1000 MCG tablet Take 1,000 mcg by mouth daily.    [provider]    Allergies    Imdur [isosorbide nitrate]  Review of Systems   Review of Systems  Constitutional: Negative for chills and fever.  HENT: Negative for congestion, rhinorrhea and sore throat.   Eyes: Negative for visual disturbance.  Respiratory: Negative for cough and shortness of breath.   Cardiovascular: Positive for chest pain. Negative for leg swelling.  Gastrointestinal: Negative for abdominal pain, diarrhea, nausea and vomiting.  Genitourinary: Negative for dysuria.  Musculoskeletal: Negative for back pain and neck pain.  Skin: Negative for rash.  Neurological: Positive for syncope. Negative for dizziness, light-headedness and headaches.  Hematological: Does not bruise/bleed easily.  Psychiatric/Behavioral: Negative for confusion.    Physical Exam Updated Vital Signs BP 104/65   Pulse 71   Temp 98.2 F (36.8 C)   Resp (!) 25   SpO2 99%   Physical Exam Vitals and nursing note reviewed.  Constitutional:      Appearance: Normal appearance. He is well-developed.  HENT:     Head: Normocephalic and atraumatic.  Eyes:     Conjunctiva/sclera: Conjunctivae normal.     Pupils: Pupils are equal, round, and reactive to light.  Cardiovascular:     Rate and Rhythm: Normal rate and regular rhythm.     Heart sounds: No murmur.  Pulmonary:     Effort: Pulmonary effort is normal. No respiratory distress.     Breath sounds: Normal breath sounds.     Comments: Well-healed sternal wound. Abdominal:     Palpations: Abdomen is soft.      Tenderness: There is no abdominal tenderness.  Musculoskeletal:        General: No swelling. Normal range of motion.     Cervical back: Normal range of motion and neck supple.  Skin:    General: Skin is warm and dry.     Capillary Refill: Capillary refill takes less than 2 seconds.  Neurological:     General: No focal deficit present.     Mental Status: He is alert and oriented to person, place, and time.     ED Results / Procedures / Treatments   Labs (all labs ordered are listed, but only abnormal results are displayed) Labs Reviewed  CBC WITH DIFFERENTIAL/PLATELET - Abnormal; Notable for the following components:      Result Value   WBC 11.9 (*)    Hemoglobin 11.1 (*)    HCT 37.1 (*)  MCH 24.9 (*)    MCHC 29.9 (*)    Neutro Abs 9.3 (*)    Monocytes Absolute 1.2 (*)    All other components within normal limits  COMPREHENSIVE METABOLIC PANEL - Abnormal; Notable for the following components:   Glucose, Bld 117 (*)    BUN 25 (*)    Creatinine, Ser 1.56 (*)    Total Protein 6.3 (*)    Albumin 3.3 (*)    GFR calc non Af Amer 46 (*)    GFR calc Af Amer 53 (*)    All other components within normal limits  CBG MONITORING, ED - Abnormal; Notable for the following components:   Glucose-Capillary 104 (*)    All other components within normal limits  TROPONIN I (HIGH SENSITIVITY)  TROPONIN I (HIGH SENSITIVITY)    EKG EKG Interpretation  Date/Time:  Wednesday Dec 25 2019 16:02:11 EDT Ventricular Rate:  71 PR Interval:    QRS Duration: 116 QT Interval:  397 QTC Calculation: 432 R Axis:   111 Text Interpretation: Sinus rhythm Atrial premature complexes Incomplete right bundle branch block Confirmed by Fredia Sorrow [phone]) on 12/25/2019 4:23:13 PM   Radiology DG Chest Port 1 View  Result Date: 12/25/2019 CLINICAL DATA:  Syncope. EXAM: PORTABLE CHEST 1 VIEW COMPARISON:  11/19/2019 FINDINGS: Stable enlarged cardiac silhouette and post CABG changes. Clear lungs with  normal vascularity. Thoracic spine degenerative changes. IMPRESSION: No acute abnormality. Stable cardiomegaly. Electronically Signed   By: Claudie Revering M.D.   On: 12/25/2019 16:50    Procedures Procedures (including critical care time)  Medications Ordered in ED Medications  0.9 %  sodium chloride infusion (has no administration in time range)    ED Course  I have reviewed the triage vital signs and the nursing notes.  Pertinent labs & imaging results that were available during my care of the patient were reviewed by me and considered in my medical decision making (see chart for details).    MDM Rules/Calculators/A&P                      Work-up here without any acute findings mild leukocytosis.  Troponin elevated at 14 but not significantly elevated.  Patient will require admission for syncopal episode.  Discussed with cardiology Dr. Bronson Ing they will probably try to do the echo tomorrow morning.  Not sure whether they will do the Myoview study or not.  Hospitalist will admit.  For cardiac monitoring due to syncopal episode.  Delta troponin was 12.  No evidence of any acute cardiac event.  Cardiac monitoring without any significant arrhythmias.  Hospitalist will admit.    Final Clinical Impression(s) / ED Diagnoses Final diagnoses:  Syncope and collapse    Rx / DC Orders ED Discharge Orders    None       Fredia Sorrow, MD 12/25/19 2105

## 2019-12-25 NOTE — ED Notes (Signed)
Admitting doctor at  The bedside 

## 2019-12-25 NOTE — H&P (Signed)
History and Physical    Rodney Young [id number] DOB: 1956/06/25 DOA: 12/25/2019  PCP: Townsend Roger, MD  Patient coming from: [facility] via EMS  I have personally briefly reviewed patient's old medical records in [place]  Chief Complaint: Syncope  HPI: Rodney Young is a 64 y.o. male with medical history significant for CAD s/p CABG 10/21/2019, hypertension, hyperlipidemia, CKD stage III, GERD who presents to the ED for evaluation after a syncopal episode.  Patient states he got the first dose of COVID-19 vaccination on 12/23/2018.  Since then he has been having vague intermittent right-sided chest pain.  He also reports having significant indigestion/heartburn.  Today he went to [place] to pick up his prescriptions.  While sitting down in a chair he suddenly became diaphoretic and lightheaded.  He passed out and slumped over in the chair but did not fall to the ground.  He denies any chest pain at the time of this event or palpitations.  He thought he was dehydrated as he has had some decreased appetite due to this chest pain.  EMS were called and he states his blood pressure was low around 68/39.  He was given IV fluids with symptomatic improvement.  ED Course:  Initial vitals showed BP 122/63, pulse 78, RR 27, temp 90.2 Fahrenheit, SPO2 100% on room air.  Labs notable for WBC 11.9, hemoglobin 11.1, platelets 195,000, sodium 139, potassium 4.2, bicarb 26, BUN 25, creatinine 1.56, high-sensitivity troponin I 14.  SARS-CoV-2 PCR is ordered and pending.  Portable chest x-ray shows prior CABG changes without acute abnormality.  EDP discussed with on-call cardiology who recommended medical admission and will see tomorrow.  The hospitalist service was consulted to me for further evaluation and management.  Review of Systems: All systems reviewed and are negative except as documented in history of present illness above.   Past Medical History:  Diagnosis Date  .  Anxiety   . Cancer [facility])    prostate  . Coronary artery disease   . GERD (gastroesophageal reflux disease)   . Headache   . Hyperlipidemia   . Hypertension     Past Surgical History:  Procedure Laterality Date  . CORONARY ARTERY BYPASS GRAFT N/A 10/21/2019   Procedure: CORONARY ARTERY BYPASS GRAFTING (CABG) x three, using left internal mammary artery, left radial artery harvested endoscopically and right greater saphenous vein harvested endoscopically;  Surgeon: Ivin Poot, MD;  Location: [place];  Service: Open Heart Surgery;  Laterality: N/A;  . LEFT HEART CATH AND CORONARY ANGIOGRAPHY N/A 10/14/2019   Procedure: LEFT HEART CATH AND CORONARY ANGIOGRAPHY;  Surgeon: Lorretta Harp, MD;  Location: [place] CV LAB;  Service: Cardiovascular;  Laterality: N/A;  . PROSTATECTOMY    . RADIAL ARTERY HARVEST Left 10/21/2019   Procedure: RIGHT RADIAL ARTERY HARVESTED ENDOSCOPICALLY;  Surgeon: Ivin Poot, MD;  Location: [place];  Service: Open Heart Surgery;  Laterality: Left;  . TEE WITHOUT CARDIOVERSION N/A 10/21/2019   Procedure: TRANSESOPHAGEAL ECHOCARDIOGRAM (TEE);  Surgeon: Prescott Gum, Collier Salina, MD;  Location: [place];  Service: Open Heart Surgery;  Laterality: N/A;    Social History:  reports that he has never smoked. He has never used smokeless tobacco. He reports previous alcohol use. He reports that he does not use drugs.  Allergies  Allergen Reactions  . Imdur [Isosorbide Nitrate] Other (See Comments)    " headache"    Family History  Problem Relation Age of Onset  . Heart attack Father  Prior to Admission medications   Medication Sig Start Date End Date Taking? Authorizing Provider  ALPRAZolam Duanne Moron) 0.5 MG tablet Take 0.5 mg by mouth at bedtime.  08/26/19   [provider]  aspirin EC 325 MG EC tablet Take 1 tablet (325 mg total) by mouth daily. 10/28/19   Elgie Collard, PA-C  atorvastatin (LIPITOR) 40 MG tablet Take 1 tablet (40 mg total) by mouth every  other day. Lunch 11/01/19   Almyra Deforest, PA  carvedilol (COREG) 6.25 MG tablet Take 1 tablet (6.25 mg total) by mouth 2 (two) times daily. 12/23/19   Donato Heinz, MD  lisinopril-hydrochlorothiazide (ZESTORETIC) 20-12.5 MG tablet Take 1 tablet by mouth in the morning and at bedtime. Pt taking 10 mg bid 08/19/19   [provider]  nitroGLYCERIN (NITROSTAT) 0.4 MG SL tablet Place 1 tablet (0.4 mg total) under the tongue every 5 (five) minutes as needed for chest pain. 12/23/19 03/22/20  Donato Heinz, MD  omeprazole (PRILOSEC) 40 MG capsule Take 40 mg by mouth daily. 08/26/19   [provider]  vitamin B-12 (CYANOCOBALAMIN) 1000 MCG tablet Take 1,000 mcg by mouth daily.    [provider]    Physical Exam: Vitals:   12/25/19 1800 12/25/19 1815 12/25/19 1830 12/25/19 1845  BP: 106/65 113/69 120/65 104/65  Pulse: 76 72 70 71  Resp: (!) 26 (!) 23 (!) 23 (!) 25  Temp:      SpO2: 100% 100% 99% 99%   Constitutional: Resting supine in bed, NAD, calm, comfortable Eyes: PERRL, lids and conjunctivae normal ENMT: Mucous membranes are moist. Posterior pharynx clear of any exudate or lesions.Normal dentition.  Neck: normal, supple, no masses. Respiratory: clear to auscultation bilaterally, no wheezing, no crackles. Normal respiratory effort. No accessory muscle use.  Cardiovascular: Regular rate and rhythm, no murmurs / rubs / gallops. No extremity edema. 2+ pedal pulses. Abdomen: no tenderness, no masses palpated. No hepatosplenomegaly. Bowel sounds positive.  Musculoskeletal: no clubbing / cyanosis. No joint deformity upper and lower extremities. Good ROM, no contractures. Normal muscle tone.  Skin: Well-healed sternotomy scar, no rashes, lesions, ulcers. No induration Neurologic: CN 2-12 grossly intact. Sensation intact, DTR normal. Strength 5/5 in all 4.  Psychiatric: Normal judgment and insight. Alert and oriented x 3. Normal mood.     Labs on Admission: I  have personally reviewed following labs and imaging studies  CBC: Recent Labs  Lab 12/23/19 0000 12/25/19 1722  WBC 9.0 11.9*  NEUTROABS  --  9.3*  HGB 11.8* 11.1*  HCT 37.6 37.1*  MCV 80 83.4  PLT 217 0000000   Basic Metabolic Panel: Recent Labs  Lab 12/23/19 0000 12/25/19 1722  NA 142 139  K 4.1 4.2  CL 100 103  CO2 26 26  GLUCOSE 83 117*  BUN 16 25*  CREATININE 1.35* 1.56*  CALCIUM 9.3 8.9   GFR: Estimated Creatinine Clearance: 45.6 mL/min (A) (by C-G formula based on SCr of 1.56 mg/dL (H)). Liver Function Tests: Recent Labs  Lab 12/23/19 0000 12/25/19 1722  AST 17 17  ALT 19 20  ALKPHOS 74 58  BILITOT 0.3 1.0  PROT 6.7 6.3*  ALBUMIN 4.4 3.3*   No results for input(s): LIPASE, AMYLASE in the last 168 hours. No results for input(s): AMMONIA in the last 168 hours. Coagulation Profile: No results for input(s): INR, PROTIME in the last 168 hours. Cardiac Enzymes: No results for input(s): CKTOTAL, CKMB, CKMBINDEX, TROPONINI in the last 168 hours. BNP (last 3  results) No results for input(s): PROBNP in the last 8760 hours. HbA1C: No results for input(s): HGBA1C in the last 72 hours. CBG: Recent Labs  Lab 12/25/19 1603  GLUCAP 104*   Lipid Profile: Recent Labs    12/23/19 0000  CHOL 162  HDL 44  LDLCALC 101*  TRIG 90  CHOLHDL 3.7   Thyroid Function Tests: No results for input(s): TSH, T4TOTAL, FREET4, T3FREE, THYROIDAB in the last 72 hours. Anemia Panel: No results for input(s): VITAMINB12, FOLATE, FERRITIN, TIBC, IRON, RETICCTPCT in the last 72 hours. Urine analysis:    Component Value Date/Time   COLORURINE YELLOW 10/17/2019 1111   APPEARANCEUR CLEAR 10/17/2019 1111   LABSPEC 1.019 10/17/2019 1111   PHURINE 5.0 10/17/2019 1111   GLUCOSEU NEGATIVE 10/17/2019 1111   HGBUR NEGATIVE 10/17/2019 1111   BILIRUBINUR NEGATIVE 10/17/2019 1111   KETONESUR NEGATIVE 10/17/2019 1111   PROTEINUR NEGATIVE 10/17/2019 1111   NITRITE NEGATIVE 10/17/2019 1111    LEUKOCYTESUR NEGATIVE 10/17/2019 1111    Radiological Exams on Admission: DG Chest Port 1 View  Result Date: 12/25/2019 CLINICAL DATA:  Syncope. EXAM: PORTABLE CHEST 1 VIEW COMPARISON:  11/19/2019 FINDINGS: Stable enlarged cardiac silhouette and post CABG changes. Clear lungs with normal vascularity. Thoracic spine degenerative changes. IMPRESSION: No acute abnormality. Stable cardiomegaly. Electronically Signed   By: Claudie Revering M.D.   On: 12/25/2019 16:50    EKG: Independently reviewed.  Sinus rhythm with PACs.  PACs new when compared to prior.  Assessment/Plan Principal Problem:   Syncope Active Problems:   CAD (coronary artery disease)   S/P CABG x 3   Hypertension   Hyperlipidemia  Alwyn Hunsaker is a 64 y.o. male with medical history significant for CAD s/p CABG 10/21/2019, hypertension, hyperlipidemia, CKD stage III, GERD who is admitted for evaluation of syncope.  Syncope: Concern for vasovagal versus cardiogenic syncope.  He had significant hypotension on EMS arrival per report which is improved with IV fluids. -Monitor on telemetry -Check orthostatic vitals -Place on IV fluid hydration overnight -Hold home antihypertensives at this time -Magnesium is 2.0 -Update echocardiogram  CAD s/p CABG 10/21/2019: Reports intermittent nonspecific right-sided chest pain.  High-sensitivity troponin I is negative x2.  EKG without acute ischemic changes. -Continue aspirin 325 mg -Continue atorvastatin -Obtain echocardiogram as above  Hypertension: Holding home antihypertensives as above.  CKD stage III: Creatinine slightly increased from recent baseline.  Continue IV fluid hydration overnight and recheck in a.m.  Hyperlipidemia: Continue atorvastatin.  DVT prophylaxis: Lovenox Code Status: DNR, confirmed with patient Family Communication: Discussed with patient, he has discussed with family Disposition Plan: From home and likely discharge to home pending further syncope  evaluation Consults called: EDP discussed with on-call cardiology Admission status:  Status is: Observation  The patient remains OBS appropriate and will d/c before 2 midnights.  Dispo: The patient is from: Home              Anticipated d/c is to: Home              Anticipated d/c date is: 1 day pending further syncope work-up              Patient currently is not medically stable to d/c.    Zada Finders MD [facility]  If 7PM-7AM, please contact night-coverage [email or web address]  12/25/2019, 7:28 PM

## 2019-12-25 NOTE — ED Notes (Signed)
REPORT GIVEN TO RN ON 5C

## 2019-12-25 NOTE — ED Notes (Signed)
1st mederma vaccine 14 days ago  Has not had the 2nd one yet

## 2019-12-25 NOTE — ED Triage Notes (Addendum)
Per EMS: Pt had open heart surgery in march, for the last week pt has been having right sided chest pain. This afternoon pt passed out. Pt was pale, cold, sweaty, and hypotensive with BP of 68/39. Pt denies chest pain or shortness of breath. 12 lead has been "good", pt has gotten 800 ml of saline with improvement of BP 128/62. Pt states he had a COVID shot on 12-13-19, this was his first COVID shot.   Pt states that he is feeling "a lot better, and I have a lot of indigestion"

## 2019-12-26 ENCOUNTER — Observation Stay (HOSPITAL_BASED_OUTPATIENT_CLINIC_OR_DEPARTMENT_OTHER): Payer: PPO

## 2019-12-26 DIAGNOSIS — R55 Syncope and collapse: Secondary | ICD-10-CM

## 2019-12-26 LAB — BASIC METABOLIC PANEL
Anion gap: 7 (ref 5–15)
BUN: 17 mg/dL (ref 8–23)
CO2: 26 mmol/L (ref 22–32)
Calcium: 8.7 mg/dL — ABNORMAL LOW (ref 8.9–10.3)
Chloride: 106 mmol/L (ref 98–111)
Creatinine, Ser: 1.36 mg/dL — ABNORMAL HIGH (ref 0.61–1.24)
GFR calc Af Amer: >60 mL/min (ref >60)
GFR calc non Af Amer: 54 mL/min — ABNORMAL LOW (ref >60)
Glucose, Bld: 190 mg/dL — ABNORMAL HIGH (ref 70–99)
Potassium: 3.7 mmol/L (ref 3.5–5.1)
Sodium: 139 mmol/L (ref 135–145)

## 2019-12-26 LAB — HIV ANTIBODY (ROUTINE TESTING W REFLEX): HIV Screen 4th Generation wRfx: NONREACTIVE

## 2019-12-26 LAB — CBC
HCT: 32.1 % — ABNORMAL LOW (ref 39.0–52.0)
Hemoglobin: 9.6 g/dL — ABNORMAL LOW (ref 13.0–17.0)
MCH: 24.9 pg — ABNORMAL LOW (ref 26.0–34.0)
MCHC: 29.9 g/dL — ABNORMAL LOW (ref 30.0–36.0)
MCV: 83.2 fL (ref 80.0–100.0)
Platelets: 169 10*3/uL (ref 150–400)
RBC: 3.86 MIL/uL — ABNORMAL LOW (ref 4.22–5.81)
RDW: 14.6 % (ref 11.5–15.5)
WBC: 7.9 10*3/uL (ref 4.0–10.5)
nRBC: 0 % (ref 0.0–0.2)

## 2019-12-26 LAB — ECHOCARDIOGRAM COMPLETE
Height: 65 in
Weight: 2786.61 oz

## 2019-12-26 LAB — GLUCOSE, CAPILLARY: Glucose-Capillary: 88 mg/dL (ref 70–99)

## 2019-12-26 MED ORDER — PERFLUTREN LIPID MICROSPHERE
1.0000 mL | INTRAVENOUS | Status: AC | PRN
  Administered 2019-12-26: 3 mL via INTRAVENOUS
  Filled 2019-12-26: qty 10

## 2019-12-26 NOTE — Consult Note (Addendum)
Patient left AMA prior to full cardiology evaluation.

## 2019-12-26 NOTE — Progress Notes (Signed)
  Echocardiogram 2D Echocardiogram has been performed.  Rodney Young 12/26/2019, 11:08 AM

## 2019-12-26 NOTE — Discharge Summary (Signed)
Physician Discharge Summary  Rodney Young [id number] DOB: [birth year] DOA: 12/25/2019  PCP: Rodney Roger, MD  Admit date: 12/25/2019 Discharge date: 12/26/2019  Admitted From: home Discharge disposition: PATIENT LEFT AMA   Recommendations for Outpatient Follow-Up:   1. Patient left ama. He has OP myoview scheduled for next week. Echo was done but results not back. He has been CP free and hemodynamically stable. No events on tele. He states he feels he just got dehydrated and feels better. Did not want to wait for cardiology input. He signed ama form and left   Discharge Diagnosis:   Principal Problem:   Syncope Active Problems:   CAD (coronary artery disease)   S/P CABG x 3   Hypertension   Hyperlipidemia   CKD (chronic kidney disease) stage 3, GFR 30-59 ml/min    Discharge Condition: Improved.  Diet recommendation: Low sodium, heart healthy.   Wound care: None.  Code status: DNR   History of Present Illness:   Rodney Young is a 64 y.o. male with medical history significant for CAD s/p CABG 10/21/2019, hypertension, hyperlipidemia, CKD stage III, GERD who presented 5/26 to the ED for evaluation after a syncopal episode.  Patient stated he got the first dose of COVID-19 vaccination on 12/23/2018.  Since then he had been having vague intermittent right-sided chest pain.  He also reported having significant indigestion/heartburn. He went to [facility] to pick up his prescriptions.  He suddenly became diaphoretic and lightheaded. He sat down in chair.  He reportedly passed out and slumped over in the chair but did not fall to the ground.  He denied any chest pain at the time of this event or palpitations.  He thought he was dehydrated as he has had some decreased appetite due to this chest pain hed been having previous days.  EMS called and he stated his blood pressure was low around 68/39.  He was given IV fluids with symptomatic improvement.   Hospital  Course by Problem:    PATIENT LEFT AMA   Syncope: Concern for vasovagal versus cardiogenic syncope.  He had significant hypotension on EMS arrival per report which improved with IV fluids. SBP 152 sitting down to 110 standing and up to 138 after 3 minutes standing. No events on tele. Echo reveals EF 60% with normal left ventricular function, no regional wall abnormalities. Of note, was seen in cards office 5/24 for evaluation of chest pain. At that time plan was to schedule OP myoview and OP echo. Echo done here and he plans to keep appointment for OP myoview. He is leaving ama as he feels better and believes he was "just dehydrated" and does not want to wait any longer.  CAD s/p CABG 10/21/2019: Reports intermittent nonspecific right-sided chest pain.  High-sensitivity troponin I is negative x2.  EKG without acute ischemic changes. See #1.   Hypertension: Held home antihypertensives as above.  CKD stage III: Creatinine slightly increased from recent baseline.   Hyperlipidemia: Continued atorvastatin.    Medical Consultants:   Cards requested but patient left ama before seen by cardiology   Discharge Exam:   Vitals:   12/26/19 0429 12/26/19 0800  BP: 125/67 (!) 109/59  Pulse: 65 64  Resp: 16 16  Temp: 98 F (36.7 C) 98.5 F (36.9 C)  SpO2: 98% 99%   Vitals:   12/25/19 2254 12/26/19 0052 12/26/19 0429 12/26/19 0800  BP: (!) 127/53 (!) 110/51 125/67 (!) 109/59  Pulse: 69 71 65 64  Resp: 20 16 16 16   Temp: 98.1 F (36.7 C) 98.4 F (36.9 C) 98 F (36.7 C) 98.5 F (36.9 C)  TempSrc: Oral Oral Oral Oral  SpO2:  99% 98% 99%  Weight: 79 kg     Height: 5\' 5"  (1.651 m)       General exam: Appears anxious and agitated but no acute distress.  Respiratory system: Clear to auscultation. Respiratory effort normal. Cardiovascular system: S1 & S2 heard, RRR. No JVD,  rubs, gallops or clicks. No murmurs. Gastrointestinal system: Abdomen is nondistended, soft and nontender.  No organomegaly or masses felt. Normal bowel sounds heard. Central nervous system: Alert and oriented. No focal neurological deficits. Extremities: No clubbing,  or cyanosis. No edema. Skin: No rashes, lesions or ulcers. Psychiatry: Judgement and insight appear normal. Mood & affect appropriate.    The results of significant diagnostics from this hospitalization (including imaging, microbiology, ancillary and laboratory) are listed below for reference.     Procedures and Diagnostic Studies:   DG Chest Port 1 View  Result Date: 12/25/2019 CLINICAL DATA:  Syncope. EXAM: PORTABLE CHEST 1 VIEW COMPARISON:  11/19/2019 FINDINGS: Stable enlarged cardiac silhouette and post CABG changes. Clear lungs with normal vascularity. Thoracic spine degenerative changes. IMPRESSION: No acute abnormality. Stable cardiomegaly. Electronically Signed   By: Claudie Revering M.D.   On: 12/25/2019 16:50   ECHOCARDIOGRAM COMPLETE  Result Date: 12/26/2019    ECHOCARDIOGRAM REPORT   [name] Date of Exam: 12/26/2019 Medical Rec #:  [id number]       Height:       65.0 in Accession #:    [id number]      Weight:       174.2 lb Date of Birth:  1956/07/23      BSA:          1.865 m Patient Age:    64 years        BP:           109/59 mmHg Patient Gender: M               HR:           71 bpm. Exam Location:  Inpatient Procedure: 2D Echo, Cardiac Doppler, Color Doppler and Intracardiac            Opacification Agent Indications:    Syncope 780.2 / R55  History:        Patient has no prior history of Echocardiogram examinations.                 CAD, Prior CABG, Signs/Symptoms:Syncope; Risk                 Factors:Hypertension, Dyslipidemia and Non-Smoker. GERD.  Sonographer:    Vickie Epley RDCS Referring Phys: [id number] [place]  1. Left ventricular ejection fraction, by estimation, is 60 to 65%. The left ventricle has normal function. The left ventricle has no regional wall motion abnormalities. Left  ventricular diastolic parameters were normal.  2. Right ventricular systolic function is normal. The right ventricular size is normal.  3. The mitral valve is grossly normal. Trivial mitral valve regurgitation.  4. The aortic valve is tricuspid. Aortic valve regurgitation is not visualized.  5. The inferior vena cava is normal in size with greater than 50% respiratory variability, suggesting right atrial pressure of 3 mmHg. FINDINGS  Left Ventricle: Left ventricular ejection fraction, by estimation, is 60 to 65%. The left ventricle has normal function.  The left ventricle has no regional wall motion abnormalities. Definity contrast agent was given IV to delineate the left ventricular  endocardial borders. The left ventricular internal cavity size was normal in size. There is no left ventricular hypertrophy. Left ventricular diastolic parameters were normal. Right Ventricle: The right ventricular size is normal. No increase in right ventricular wall thickness. Right ventricular systolic function is normal. Left Atrium: Left atrial size was normal in size. Right Atrium: Right atrial size was normal in size. Pericardium: Trivial pericardial effusion is present. The pericardial effusion is posterior to the left ventricle. Mitral Valve: The mitral valve is grossly normal. Trivial mitral valve regurgitation. Tricuspid Valve: The tricuspid valve is grossly normal. Tricuspid valve regurgitation is trivial. Aortic Valve: The aortic valve is tricuspid. Aortic valve regurgitation is not visualized. Pulmonic Valve: The pulmonic valve was normal in structure. Pulmonic valve regurgitation is not visualized. Aorta: The aortic root and ascending aorta are structurally normal, with no evidence of dilitation. Venous: The inferior vena cava is normal in size with greater than 50% respiratory variability, suggesting right atrial pressure of 3 mmHg. IAS/Shunts: No atrial level shunt detected by color flow Doppler.  LEFT VENTRICLE PLAX 2D  LVIDd:         5.42 cm      Diastology LVIDs:         3.56 cm      LV e' lateral:   10.50 cm/s LV PW:         0.93 cm      LV E/e' lateral: 10.3 LV IVS:        0.92 cm      LV e' medial:    6.89 cm/s LVOT diam:     1.80 cm      LV E/e' medial:  15.7 LV SV:         60 LV SV Index:   32 LVOT Area:     2.54 cm  LV Volumes (MOD) LV vol d, MOD A2C: 93.4 ml LV vol d, MOD A4C: 104.0 ml LV vol s, MOD A2C: 46.3 ml LV vol s, MOD A4C: 39.7 ml LV SV MOD A2C:     47.1 ml LV SV MOD A4C:     104.0 ml LV SV MOD BP:      56.1 ml RIGHT VENTRICLE RV S prime:     11.30 cm/s TAPSE (M-mode): 1.5 cm LEFT ATRIUM             Index       RIGHT ATRIUM           Index LA diam:        4.30 cm 2.31 cm/m  RA Area:     13.70 cm LA Vol (A2C):   31.9 ml 17.10 ml/m RA Volume:   28.80 ml  15.44 ml/m LA Vol (A4C):   32.6 ml 17.48 ml/m LA Biplane Vol: 33.1 ml 17.75 ml/m  AORTIC VALVE LVOT Vmax:   102.00 cm/s LVOT Vmean:  65.600 cm/s LVOT VTI:    0.237 m  AORTA Ao Root diam: 3.20 cm MITRAL VALVE MV Area (PHT): 4.80 cm     SHUNTS MV Decel Time: 158 msec     Systemic VTI:  0.24 m MV E velocity: 108.00 cm/s  Systemic Diam: 1.80 cm MV A velocity: 43.70 cm/s MV E/A ratio:  2.47 Lyman Bishop MD Electronically signed by Lyman Bishop MD Signature Date/Time: 12/26/2019/12:52:40 PM    Final      Labs:  Basic Metabolic Panel: Recent Labs  Lab 12/23/19 0000 12/23/19 0000 12/25/19 1722 12/25/19 2110 12/26/19 0818  NA 142  --  139  --  139  K 4.1   < > 4.2  --  3.7  CL 100  --  103  --  106  CO2 26  --  26  --  26  GLUCOSE 83  --  117*  --  190*  BUN 16  --  25*  --  17  CREATININE 1.35*  --  1.56*  --  1.36*  CALCIUM 9.3  --  8.9  --  8.7*  MG  --   --   --  2.0  --    < > = values in this interval not displayed.   GFR Estimated Creatinine Clearance: 52.5 mL/min (A) (by C-G formula based on SCr of 1.36 mg/dL (H)). Liver Function Tests: Recent Labs  Lab 12/23/19 0000 12/25/19 1722  AST 17 17  ALT 19 20  ALKPHOS 74 58    BILITOT 0.3 1.0  PROT 6.7 6.3*  ALBUMIN 4.4 3.3*   No results for input(s): LIPASE, AMYLASE in the last 168 hours. No results for input(s): AMMONIA in the last 168 hours. Coagulation profile No results for input(s): INR, PROTIME in the last 168 hours.  CBC: Recent Labs  Lab 12/23/19 0000 12/25/19 1722 12/26/19 0818  WBC 9.0 11.9* 7.9  NEUTROABS  --  9.3*  --   HGB 11.8* 11.1* 9.6*  HCT 37.6 37.1* 32.1*  MCV 80 83.4 83.2  PLT 217 195 169   Cardiac Enzymes: No results for input(s): CKTOTAL, CKMB, CKMBINDEX, TROPONINI in the last 168 hours. BNP: Invalid input(s): POCBNP CBG: Recent Labs  Lab 12/25/19 1603 12/26/19 0604  GLUCAP 104* 88   D-Dimer No results for input(s): DDIMER in the last 72 hours. Hgb A1c No results for input(s): HGBA1C in the last 72 hours. Lipid Profile No results for input(s): CHOL, HDL, LDLCALC, TRIG, CHOLHDL, LDLDIRECT in the last 72 hours. Thyroid function studies No results for input(s): TSH, T4TOTAL, T3FREE, THYROIDAB in the last 72 hours.  Invalid input(s): FREET3 Anemia work up No results for input(s): VITAMINB12, FOLATE, FERRITIN, TIBC, IRON, RETICCTPCT in the last 72 hours. Microbiology Recent Results (from the past 240 hour(s))  SARS Coronavirus 2 by RT PCR (hospital order, performed in [facility] hospital lab) Nasopharyngeal Nasopharyngeal Swab     Status: None   Collection Time: 12/25/19  8:37 PM   Specimen: Nasopharyngeal Swab  Result Value Ref Range Status   SARS Coronavirus 2 NEGATIVE NEGATIVE Final    Comment: (NOTE) SARS-CoV-2 target nucleic acids are NOT DETECTED. The SARS-CoV-2 RNA is generally detectable in upper and lower respiratory specimens during the acute phase of infection. The lowest concentration of SARS-CoV-2 viral copies this assay can detect is 250 copies / mL. A negative result does not preclude SARS-CoV-2 infection and should not be used as the sole basis for treatment or other patient management  decisions.  A negative result may occur with improper specimen collection / handling, submission of specimen other than nasopharyngeal swab, presence of viral mutation(s) within the areas targeted by this assay, and inadequate number of viral copies (<250 copies / mL). A negative result must be combined with clinical observations, patient history, and epidemiological information. Fact Sheet for Patients:   StrictlyIdeas.no Fact Sheet for Healthcare Providers: BankingDealers.co.za This test is not yet approved or cleared  by the [place] FDA and has been authorized for  detection and/or diagnosis of SARS-CoV-2 by FDA under an Emergency Use Authorization (EUA).  This EUA will remain in effect (meaning this test can be used) for the duration of the COVID-19 declaration under Section 564(b)(1) of the Act, 21 U.S.C. section 360bbb-3(b)(1), unless the authorization is terminated or revoked sooner. Performed at [facility], [place] [address], [place], [place] [address]      Discharge Instructions:        Time coordinating discharge: 40 minutes  Signed:  Radene Gunning NP  [facility] 12/26/2019, 2:10 PM

## 2019-12-27 DIAGNOSIS — Z8546 Personal history of malignant neoplasm of prostate: Secondary | ICD-10-CM | POA: Diagnosis not present

## 2019-12-27 DIAGNOSIS — C61 Malignant neoplasm of prostate: Secondary | ICD-10-CM | POA: Diagnosis not present

## 2019-12-27 DIAGNOSIS — N393 Stress incontinence (female) (male): Secondary | ICD-10-CM | POA: Diagnosis not present

## 2019-12-27 DIAGNOSIS — N5231 Erectile dysfunction following radical prostatectomy: Secondary | ICD-10-CM | POA: Diagnosis not present

## 2019-12-31 ENCOUNTER — Telehealth (HOSPITAL_COMMUNITY): Payer: Self-pay

## 2019-12-31 ENCOUNTER — Telehealth: Payer: Self-pay

## 2019-12-31 NOTE — Telephone Encounter (Addendum)
Called the number listed for the patient's home number, a woman answered there is not a DPR on file so I asked if she could have the patient give me a call back at the office.  ----- Message from Roy, [place] sent at 12/31/2019  9:50 AM EDT ----- Liver function normal. Bad cholesterol LDL still not controlled, ideally, it should be <70. Recommend increase lipitor to 80mg  daily, repeat FLP and LFT in 2-3 month. Aware Mr Fochs was admitted to the hospital recently, will need close outpatient followup, it appears he is scheduled to see Dr. Margaretann Loveless this month

## 2019-12-31 NOTE — Telephone Encounter (Signed)
Spoke with the patient, detailed instructions were given. He stated that he would be here for his test. Asked to call back with any questions. Rodney Young EMTP 

## 2019-12-31 NOTE — Telephone Encounter (Signed)
Patient returned call. The patient has been notified of the result. Patient stated that he is currently taking 40 mg of Lipitor every other day and that is due to the medication is giving him headaches. Mr. Glickstein stated that he is thinking about cutting the 40 mg tablet in half. Patient was informed that I would pass along this information and get back to him. Patient verbalized understanding. All questions (if any) were answered. Jacqulynn Cadet, [place] 12/31/2019 12:27 PM

## 2019-12-31 NOTE — Telephone Encounter (Signed)
Patient called back returning Terrah's call.

## 2020-01-01 ENCOUNTER — Ambulatory Visit (INDEPENDENT_AMBULATORY_CARE_PROVIDER_SITE_OTHER): Payer: Self-pay | Admitting: Cardiothoracic Surgery

## 2020-01-01 ENCOUNTER — Encounter: Payer: Self-pay | Admitting: Cardiothoracic Surgery

## 2020-01-01 ENCOUNTER — Other Ambulatory Visit: Payer: Self-pay

## 2020-01-01 VITALS — BP 127/67 | HR 70 | Temp 97.7°F | Resp 20 | Ht 65.0 in | Wt 173.0 lb

## 2020-01-01 DIAGNOSIS — Z09 Encounter for follow-up examination after completed treatment for conditions other than malignant neoplasm: Secondary | ICD-10-CM

## 2020-01-01 DIAGNOSIS — G5632 Lesion of radial nerve, left upper limb: Secondary | ICD-10-CM

## 2020-01-01 DIAGNOSIS — I2510 Atherosclerotic heart disease of native coronary artery without angina pectoris: Secondary | ICD-10-CM

## 2020-01-01 DIAGNOSIS — Z951 Presence of aortocoronary bypass graft: Secondary | ICD-10-CM

## 2020-01-01 HISTORY — DX: Lesion of radial nerve, left upper limb: G56.32

## 2020-01-01 NOTE — Progress Notes (Signed)
PCP is Townsend Roger, MD Referring Provider is Lorretta Harp, MD  Chief Complaint  Patient presents with  . Routine Post Op    6 week f/u     HPI Patient presents almost 3 months to her urgent CABG x3 at which time the left radial artery was used as a bypass graft.  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  This is now improved with time, exercises of the hand and thiamine tablets.  His grip is significant improved and he has no more neuropathic pain.  Chest incision is well-healed but he still has some neuropathic pain in his left anterior chest.  Recently the patient became dehydrated in the summer heat and had a syncopal episode while seated.  His blood pressure was low at that time.  He was briefly admitted to the hospital and rehydrated.  Cardiac enzymes were negative and EKG was unremarkable.  Patient had a echocardiogram May 28 which I reviewed which shows normal LV function, sinus rhythm, and insignificant pericardial effusion.  The patient is scheduled for a Myoview scan by cardiology next week.   Past Medical History:  Diagnosis Date  . Anxiety   . Cancer [facility])    prostate  . Coronary artery disease   . GERD (gastroesophageal reflux disease)   . Headache   . Hyperlipidemia   . Hypertension     Past Surgical History:  Procedure Laterality Date  . CORONARY ARTERY BYPASS GRAFT N/A 10/21/2019   Procedure: CORONARY ARTERY BYPASS GRAFTING (CABG) x three, using left internal mammary artery, left radial artery harvested endoscopically and right greater saphenous vein harvested endoscopically;  Surgeon: Ivin Poot, MD;  Location: [place];  Service: Open Heart Surgery;  Laterality: N/A;  . LEFT HEART CATH AND CORONARY ANGIOGRAPHY N/A 10/14/2019   Procedure: LEFT HEART CATH AND CORONARY ANGIOGRAPHY;  Surgeon: Lorretta Harp, MD;  Location: [place] [facility];  Service: Cardiovascular;  Laterality: N/A;  . PROSTATECTOMY     . RADIAL ARTERY HARVEST Left 10/21/2019   Procedure: RIGHT RADIAL ARTERY HARVESTED ENDOSCOPICALLY;  Surgeon: Ivin Poot, MD;  Location: [place];  Service: Open Heart Surgery;  Laterality: Left;  . TEE WITHOUT CARDIOVERSION N/A 10/21/2019   Procedure: TRANSESOPHAGEAL ECHOCARDIOGRAM (TEE);  Surgeon: Prescott Gum, Collier Salina, MD;  Location: [place];  Service: Open Heart Surgery;  Laterality: N/A;    Family History  Problem Relation Age of Onset  . Heart attack Father     Social History Social History   Tobacco Use  . Smoking status: Never Smoker  . Smokeless tobacco: Never Used  Substance Use Topics  . Alcohol use: Not Currently  . Drug use: Never    Current Outpatient Medications  Medication Sig Dispense Refill  . ALPRAZolam (XANAX) 0.5 MG tablet Take 0.5 mg by mouth at bedtime.     Marland Kitchen aspirin EC 325 MG EC tablet Take 1 tablet (325 mg total) by mouth daily. 30 tablet 0  . atorvastatin (LIPITOR) 40 MG tablet Take 1 tablet (40 mg total) by mouth every other day. Lunch 90 tablet 3  . carvedilol (COREG) 6.25 MG tablet Take 1 tablet (6.25 mg total) by mouth 2 (two) times daily. 180 tablet 3  . lisinopril-hydrochlorothiazide (ZESTORETIC) 10-12.5 MG tablet Take 1 tablet by mouth 2 (two) times daily.    . nitroGLYCERIN (NITROSTAT) 0.4 MG SL tablet Place 1 tablet (0.4 mg total) under the tongue every 5 (five) minutes  as needed for chest pain. 25 tablet 3  . omeprazole (PRILOSEC) 40 MG capsule Take 40 mg by mouth daily.    . vitamin B-12 (CYANOCOBALAMIN) 1000 MCG tablet Take 1,000 mcg by mouth daily.     No current facility-administered medications for this visit.    Allergies  Allergen Reactions  . Imdur [Isosorbide Nitrate] Other (See Comments)    " headache"    Review of Systems   Patient has been progressing fairly well but had an episode of orthostatic hypotensive syncope from dehydration as noted above.  BP 127/67   Pulse 70   Temp 97.7 F (36.5 C) (Skin)   Resp 20   Ht 5\' 5"   (1.651 m)   Wt 173 lb (78.5 kg)   SpO2 96% Comment: RA  BMI 28.79 kg/m  Physical Exam Alert and comfortable Surgical incisions well-healed in chest arm and leg Heart rate regular without murmur Lungs are clear Good left hand grip and neuromuscular function  Diagnostic Tests: Recent echocardiogram personally reviewed showing normal LV function  Impression: Plan Patient is recovering well after multivessel CABG 3 months ago. He understands he should follow heart healthy lifestyle including heart healthy diet and ambulating 30 minutes 5 days a week. Continue current meds and he will be followed by his cardiologist and return here as needed.     Len Childs, MD [facility] [phone]

## 2020-01-02 ENCOUNTER — Ambulatory Visit (HOSPITAL_COMMUNITY): Payer: PPO | Attending: Cardiology

## 2020-01-02 DIAGNOSIS — Z951 Presence of aortocoronary bypass graft: Secondary | ICD-10-CM | POA: Diagnosis not present

## 2020-01-02 DIAGNOSIS — I2510 Atherosclerotic heart disease of native coronary artery without angina pectoris: Secondary | ICD-10-CM | POA: Diagnosis not present

## 2020-01-02 DIAGNOSIS — R079 Chest pain, unspecified: Secondary | ICD-10-CM

## 2020-01-02 LAB — MYOCARDIAL PERFUSION IMAGING
LV dias vol: 88 mL (ref 62–150)
LV sys vol: 35 mL
Peak HR: 88 {beats}/min
Rest HR: 57 {beats}/min
SDS: 0
SRS: 0
SSS: 0
TID: 1.05

## 2020-01-02 MED ORDER — TECHNETIUM TC 99M TETROFOSMIN IV KIT
32.1000 | PACK | Freq: Once | INTRAVENOUS | Status: AC | PRN
  Administered 2020-01-02: 32.1 via INTRAVENOUS
  Filled 2020-01-02: qty 33

## 2020-01-02 MED ORDER — REGADENOSON 0.4 MG/5ML IV SOLN
0.4000 mg | Freq: Once | INTRAVENOUS | Status: AC
  Administered 2020-01-02: 0.4 mg via INTRAVENOUS

## 2020-01-02 MED ORDER — TECHNETIUM TC 99M TETROFOSMIN IV KIT
9.8000 | PACK | Freq: Once | INTRAVENOUS | Status: AC | PRN
  Administered 2020-01-02: 9.8 via INTRAVENOUS
  Filled 2020-01-02: qty 10

## 2020-01-07 ENCOUNTER — Other Ambulatory Visit (HOSPITAL_COMMUNITY): Payer: PPO

## 2020-01-17 DIAGNOSIS — Z006 Encounter for examination for normal comparison and control in clinical research program: Secondary | ICD-10-CM

## 2020-01-17 NOTE — Research (Signed)
[place]  90 Day Follow up  Patient doing well at this time, no adverse events related to MAKE parameter. This completes the patients participation in the [employer].   Current Outpatient Medications:  .  ALPRAZolam (XANAX) 0.5 MG tablet, Take 0.5 mg by mouth at bedtime. , Disp: , Rfl:  .  aspirin EC 325 MG EC tablet, Take 1 tablet (325 mg total) by mouth daily., Disp: 30 tablet, Rfl: 0 .  atorvastatin (LIPITOR) 40 MG tablet, Take 1 tablet (40 mg total) by mouth every other day. Lunch, Disp: 90 tablet, Rfl: 3 .  carvedilol (COREG) 6.25 MG tablet, Take 1 tablet (6.25 mg total) by mouth 2 (two) times daily., Disp: 180 tablet, Rfl: 3 .  lisinopril-hydrochlorothiazide (ZESTORETIC) 10-12.5 MG tablet, Take 1 tablet by mouth 2 (two) times daily., Disp: , Rfl:  .  nitroGLYCERIN (NITROSTAT) 0.4 MG SL tablet, Place 1 tablet (0.4 mg total) under the tongue every 5 (five) minutes as needed for chest pain., Disp: 25 tablet, Rfl: 3 .  omeprazole (PRILOSEC) 40 MG capsule, Take 40 mg by mouth daily., Disp: , Rfl:  .  vitamin B-12 (CYANOCOBALAMIN) 1000 MCG tablet, Take 1,000 mcg by mouth daily., Disp: , Rfl:

## 2020-01-23 ENCOUNTER — Ambulatory Visit: Payer: PPO | Admitting: Internal Medicine

## 2020-01-23 ENCOUNTER — Other Ambulatory Visit: Payer: Self-pay

## 2020-01-23 ENCOUNTER — Encounter: Payer: Self-pay | Admitting: Internal Medicine

## 2020-01-23 VITALS — BP 124/72 | HR 56 | Ht 65.0 in | Wt 175.6 lb

## 2020-01-23 DIAGNOSIS — I2510 Atherosclerotic heart disease of native coronary artery without angina pectoris: Secondary | ICD-10-CM | POA: Diagnosis not present

## 2020-01-23 DIAGNOSIS — I10 Essential (primary) hypertension: Secondary | ICD-10-CM

## 2020-01-23 DIAGNOSIS — Z951 Presence of aortocoronary bypass graft: Secondary | ICD-10-CM | POA: Diagnosis not present

## 2020-01-23 DIAGNOSIS — Z79899 Other long term (current) drug therapy: Secondary | ICD-10-CM | POA: Diagnosis not present

## 2020-01-23 DIAGNOSIS — R7303 Prediabetes: Secondary | ICD-10-CM | POA: Diagnosis not present

## 2020-01-23 DIAGNOSIS — E785 Hyperlipidemia, unspecified: Secondary | ICD-10-CM

## 2020-01-23 DIAGNOSIS — R079 Chest pain, unspecified: Secondary | ICD-10-CM | POA: Diagnosis not present

## 2020-01-23 NOTE — Progress Notes (Signed)
Cardiology Office Note:    Date:  01/23/2020   ID:  Rodney Young, DOB 06-Jan-1956, MRN [id number]  PCP:  Townsend Roger, MD  Cardiologist:  Elouise Munroe, MD  Electrophysiologist:  None   Referring MD: Nona Dell, Corene Cornea, MD   Chief Complaint: CAD s/p CABG  History of Present Illness:    Rodney Young is a 64 y.o. male with a history of CAD status post CABG (LIMA-LAD, left radial-OM, SVG-PDA) on 10/21/19, hypertension, hyperlipidemia.  On June 11 he got his second covid shot and then after his right leg numb - then got better.   No chest pain, but describes indigestion particularly when he eats cucumbers. We discussed his ischemic testing with my partner Dr. Gardiner Rhyme recently, normal stress test. He is taking omeprazole for GERD.   Past Medical History:  Diagnosis Date  . Anxiety   . Cancer [facility])    prostate  . Coronary artery disease   . GERD (gastroesophageal reflux disease)   . Headache   . Hyperlipidemia   . Hypertension     Past Surgical History:  Procedure Laterality Date  . CORONARY ARTERY BYPASS GRAFT N/A 10/21/2019   Procedure: CORONARY ARTERY BYPASS GRAFTING (CABG) x three, using left internal mammary artery, left radial artery harvested endoscopically and right greater saphenous vein harvested endoscopically;  Surgeon: Ivin Poot, MD;  Location: [place];  Service: Open Heart Surgery;  Laterality: N/A;  . LEFT HEART CATH AND CORONARY ANGIOGRAPHY N/A 10/14/2019   Procedure: LEFT HEART CATH AND CORONARY ANGIOGRAPHY;  Surgeon: Lorretta Harp, MD;  Location: [place] [facility];  Service: Cardiovascular;  Laterality: N/A;  . PROSTATECTOMY    . RADIAL ARTERY HARVEST Left 10/21/2019   Procedure: RIGHT RADIAL ARTERY HARVESTED ENDOSCOPICALLY;  Surgeon: Ivin Poot, MD;  Location: [place];  Service: Open Heart Surgery;  Laterality: Left;  . TEE WITHOUT CARDIOVERSION N/A 10/21/2019   Procedure: TRANSESOPHAGEAL ECHOCARDIOGRAM (TEE);  Surgeon: Prescott Gum, Collier Salina, MD;   Location: [place];  Service: Open Heart Surgery;  Laterality: N/A;    Current Medications: Current Meds  Medication Sig  . ALPRAZolam (XANAX) 0.5 MG tablet Take 0.5 mg by mouth at bedtime.   Marland Kitchen aspirin EC 325 MG EC tablet Take 1 tablet (325 mg total) by mouth daily.  Marland Kitchen atorvastatin (LIPITOR) 40 MG tablet Take 1 tablet (40 mg total) by mouth every other day. Lunch  . carvedilol (COREG) 6.25 MG tablet Take 1 tablet (6.25 mg total) by mouth 2 (two) times daily.  Marland Kitchen lisinopril-hydrochlorothiazide (ZESTORETIC) 10-12.5 MG tablet Take 1 tablet by mouth 2 (two) times daily.  . nitroGLYCERIN (NITROSTAT) 0.4 MG SL tablet Place 1 tablet (0.4 mg total) under the tongue every 5 (five) minutes as needed for chest pain.  Marland Kitchen omeprazole (PRILOSEC) 40 MG capsule Take 40 mg by mouth daily.  . vitamin B-12 (CYANOCOBALAMIN) 1000 MCG tablet Take 1,000 mcg by mouth daily.     Allergies:   Imdur [isosorbide nitrate]   Social History   Socioeconomic History  . Marital status: Single    Spouse name: Not on file  . Number of children: Not on file  . Years of education: Not on file  . Highest education level: Not on file  Occupational History  . Not on file  Tobacco Use  . Smoking status: Never Smoker  . Smokeless tobacco: Never Used  Vaping Use  . Vaping Use: Never used  Substance and Sexual Activity  . Alcohol use: Not Currently  .  Drug use: Never  . Sexual activity: Not on file  Other Topics Concern  . Not on file  Social History Narrative  . Not on file   Social Determinants of Health   Financial Resource Strain:   . Difficulty of Paying Living Expenses:   Food Insecurity:   . Worried About Charity fundraiser in the Last Year:   . Arboriculturist in the Last Year:   Transportation Needs:   . Film/video editor (Medical):   Marland Kitchen Lack of Transportation (Non-Medical):   Physical Activity:   . Days of Exercise per Week:   . Minutes of Exercise per Session:   Stress:   . Feeling of Stress :     Social Connections:   . Frequency of Communication with Friends and Family:   . Frequency of Social Gatherings with Friends and Family:   . Attends Religious Services:   . Active Member of Clubs or Organizations:   . Attends Archivist Meetings:   Marland Kitchen Marital Status:      Family History: The patient's family history includes Heart attack in his father.  ROS:   Please see the history of present illness.    All other systems reviewed and are negative.  EKGs/Labs/Other Studies Reviewed:    The following studies were reviewed today:  Stress myoview 01/02/20  Recent Labs: 12/25/2019: ALT 20; Magnesium 2.0 12/26/2019: BUN 17; Creatinine, Ser 1.36; Hemoglobin 9.6; Platelets 169; Potassium 3.7; Sodium 139  Recent Lipid Panel    Component Value Date/Time   CHOL 162 12/23/2019 0000   TRIG 90 12/23/2019 0000   HDL 44 12/23/2019 0000   CHOLHDL 3.7 12/23/2019 0000   LDLCALC 101 (H) 12/23/2019 0000    Physical Exam:    VS:  BP 124/72   Pulse (!) 56   Ht 5\' 5"  (1.651 m)   Wt 175 lb 9.6 oz (79.7 kg)   SpO2 99%   BMI 29.22 kg/m     Wt Readings from Last 5 Encounters:  01/23/20 175 lb 9.6 oz (79.7 kg)  01/02/20 174 lb (78.9 kg)  01/01/20 173 lb (78.5 kg)  12/25/19 174 lb 2.6 oz (79 kg)  12/23/19 173 lb (78.5 kg)     Constitutional: No acute distress Eyes: sclera non-icteric, normal conjunctiva and lids ENMT: normal dentition, moist mucous membranes Chest: sternotomy incision well healing. Cardiovascular: regular rhythm, normal rate, no murmurs. S1 and S2 normal. Radial pulses normal bilaterally. No jugular venous distention.  Respiratory: clear to auscultation bilaterally GI : normal bowel sounds, soft and nontender. No distention.   MSK: extremities warm, well perfused. No edema.  NEURO: grossly nonfocal exam, moves all extremities. PSYCH: alert and oriented x 3, normal mood and affect.   ASSESSMENT:    1. CAD in native artery   2. S/P CABG x 3   3. Chest pain  of uncertain etiology   4. Essential hypertension   5. Hyperlipidemia, unspecified hyperlipidemia type   6. Prediabetes   7. Medication management    PLAN:    CAD in native artery S/P CABG x 3 - ASA 325 mg daily - transition to 81 mg ASA daily.  - continue atorvastatin 40 mg daily - continue carvedilol 6.25 mg BID  Chest pain - likely extracardiac - feels it's related to heartburn. Continue PPI. Discussed prn H2 blocker use (famotidine ok).   Essential hypertension  - continue BB, lisinopril-HCTZ 10-12.5 mg  Hyperlipidemia, unspecified hyperlipidemia type -LDL needs further optimization, would  increase atorvastatin to 80 mg daily.  Prediabetes - per PCP, last A1C 6.1. Diet and exercise encouraged.   Total time of encounter: 30 minutes total time of encounter, including 25 minutes spent in face-to-face patient care on the date of this encounter. This time includes coordination of care and counseling regarding above mentioned problem list. Remainder of non-face-to-face time involved reviewing chart documents/testing relevant to the patient encounter and documentation in the medical record. I have independently reviewed documentation from referring provider.   Cherlynn Kaiser, MD [place]  [facility]    Medication Adjustments/Labs and Tests Ordered: Current medicines are reviewed at length with the patient today.  Concerns regarding medicines are outlined above.  No orders of the defined types were placed in this encounter.  No orders of the defined types were placed in this encounter.   Patient Instructions  Medication Instructions:  No changes  *If you need a refill on your cardiac medications before your next appointment, please call your pharmacy*   Lab Work: Not needed If you have labs (blood work) drawn today and your tests are completely normal, you will receive your results only by: Marland Kitchen MyChart Message (if you have MyChart) OR . A paper copy in the mail If  you have any lab test that is abnormal or we need to change your treatment, we will call you to review the results.   Testing/Procedures: Not needed   Follow-Up: At [facility], you and your health needs are our priority.  As part of our continuing mission to provide you with exceptional heart care, we have created designated Provider Care Teams.  These Care Teams include your primary Cardiologist (physician) and Advanced Practice Providers (APPs -  Physician Assistants and Nurse Practitioners) who all work together to provide you with the care you need, when you need it.  We recommend signing up for the patient portal called "MyChart".  Sign up information is provided on this After Visit Summary.  MyChart is used to connect with patients for Virtual Visits (Telemedicine).  Patients are able to view lab/test results, encounter notes, upcoming appointments, etc.  Non-urgent messages can be sent to your provider as well.   To learn more about what you can do with MyChart, go to [email or web address].    Your next appointment:   6 month(s)  The format for your next appointment:   In Person  Provider:   Cherlynn Kaiser, MD

## 2020-01-23 NOTE — Patient Instructions (Signed)
Medication Instructions:  No changes  *If you need a refill on your cardiac medications before your next appointment, please call your pharmacy*   Lab Work: Not needed If you have labs (blood work) drawn today and your tests are completely normal, you will receive your results only by: Marland Kitchen MyChart Message (if you have MyChart) OR . A paper copy in the mail If you have any lab test that is abnormal or we need to change your treatment, we will call you to review the results.   Testing/Procedures: Not needed   Follow-Up: At [facility], you and your health needs are our priority.  As part of our continuing mission to provide you with exceptional heart care, we have created designated Provider Care Teams.  These Care Teams include your primary Cardiologist (physician) and Advanced Practice Providers (APPs -  Physician Assistants and Nurse Practitioners) who all work together to provide you with the care you need, when you need it.  We recommend signing up for the patient portal called "MyChart".  Sign up information is provided on this After Visit Summary.  MyChart is used to connect with patients for Virtual Visits (Telemedicine).  Patients are able to view lab/test results, encounter notes, upcoming appointments, etc.  Non-urgent messages can be sent to your provider as well.   To learn more about what you can do with MyChart, go to [email or web address].    Your next appointment:   6 month(s)  The format for your next appointment:   In Person  Provider:   Cherlynn Kaiser, MD

## 2020-01-24 DIAGNOSIS — M6289 Other specified disorders of muscle: Secondary | ICD-10-CM | POA: Diagnosis not present

## 2020-01-24 DIAGNOSIS — N3943 Post-void dribbling: Secondary | ICD-10-CM | POA: Diagnosis not present

## 2020-01-24 DIAGNOSIS — N393 Stress incontinence (female) (male): Secondary | ICD-10-CM | POA: Diagnosis not present

## 2020-01-24 DIAGNOSIS — N3941 Urge incontinence: Secondary | ICD-10-CM | POA: Diagnosis not present

## 2020-01-24 DIAGNOSIS — M6281 Muscle weakness (generalized): Secondary | ICD-10-CM | POA: Diagnosis not present

## 2020-02-14 DIAGNOSIS — N3943 Post-void dribbling: Secondary | ICD-10-CM | POA: Diagnosis not present

## 2020-02-14 DIAGNOSIS — N3941 Urge incontinence: Secondary | ICD-10-CM | POA: Diagnosis not present

## 2020-02-14 DIAGNOSIS — N393 Stress incontinence (female) (male): Secondary | ICD-10-CM | POA: Diagnosis not present

## 2020-02-14 DIAGNOSIS — M6289 Other specified disorders of muscle: Secondary | ICD-10-CM | POA: Diagnosis not present

## 2020-02-14 DIAGNOSIS — M6281 Muscle weakness (generalized): Secondary | ICD-10-CM | POA: Diagnosis not present

## 2020-02-28 DIAGNOSIS — D509 Iron deficiency anemia, unspecified: Secondary | ICD-10-CM | POA: Diagnosis not present

## 2020-03-05 DIAGNOSIS — N3941 Urge incontinence: Secondary | ICD-10-CM | POA: Diagnosis not present

## 2020-03-05 DIAGNOSIS — M6289 Other specified disorders of muscle: Secondary | ICD-10-CM | POA: Diagnosis not present

## 2020-03-05 DIAGNOSIS — N3943 Post-void dribbling: Secondary | ICD-10-CM | POA: Diagnosis not present

## 2020-03-05 DIAGNOSIS — Z8546 Personal history of malignant neoplasm of prostate: Secondary | ICD-10-CM | POA: Diagnosis not present

## 2020-03-05 DIAGNOSIS — N393 Stress incontinence (female) (male): Secondary | ICD-10-CM | POA: Diagnosis not present

## 2020-03-05 DIAGNOSIS — M6281 Muscle weakness (generalized): Secondary | ICD-10-CM | POA: Diagnosis not present

## 2020-03-17 DIAGNOSIS — Z1211 Encounter for screening for malignant neoplasm of colon: Secondary | ICD-10-CM | POA: Diagnosis not present

## 2020-03-27 DIAGNOSIS — N3941 Urge incontinence: Secondary | ICD-10-CM | POA: Diagnosis not present

## 2020-03-27 DIAGNOSIS — Z8546 Personal history of malignant neoplasm of prostate: Secondary | ICD-10-CM | POA: Diagnosis not present

## 2020-03-27 DIAGNOSIS — N5231 Erectile dysfunction following radical prostatectomy: Secondary | ICD-10-CM | POA: Diagnosis not present

## 2020-03-27 DIAGNOSIS — N393 Stress incontinence (female) (male): Secondary | ICD-10-CM | POA: Diagnosis not present

## 2020-04-16 DIAGNOSIS — Z86010 Personal history of colonic polyps: Secondary | ICD-10-CM | POA: Diagnosis not present

## 2020-04-16 DIAGNOSIS — D509 Iron deficiency anemia, unspecified: Secondary | ICD-10-CM | POA: Diagnosis not present

## 2020-04-20 DIAGNOSIS — Z86010 Personal history of colonic polyps: Secondary | ICD-10-CM | POA: Diagnosis not present

## 2020-06-04 DIAGNOSIS — D509 Iron deficiency anemia, unspecified: Secondary | ICD-10-CM | POA: Diagnosis not present

## 2020-06-04 DIAGNOSIS — I10 Essential (primary) hypertension: Secondary | ICD-10-CM | POA: Diagnosis not present

## 2020-06-04 DIAGNOSIS — Z23 Encounter for immunization: Secondary | ICD-10-CM | POA: Diagnosis not present

## 2020-06-10 DIAGNOSIS — Z1159 Encounter for screening for other viral diseases: Secondary | ICD-10-CM | POA: Diagnosis not present

## 2020-06-15 DIAGNOSIS — D120 Benign neoplasm of cecum: Secondary | ICD-10-CM | POA: Diagnosis not present

## 2020-06-15 DIAGNOSIS — K5730 Diverticulosis of large intestine without perforation or abscess without bleeding: Secondary | ICD-10-CM | POA: Diagnosis not present

## 2020-06-15 DIAGNOSIS — K2930 Chronic superficial gastritis without bleeding: Secondary | ICD-10-CM | POA: Diagnosis not present

## 2020-06-15 DIAGNOSIS — K648 Other hemorrhoids: Secondary | ICD-10-CM | POA: Diagnosis not present

## 2020-06-15 DIAGNOSIS — D122 Benign neoplasm of ascending colon: Secondary | ICD-10-CM | POA: Diagnosis not present

## 2020-06-15 DIAGNOSIS — B9681 Helicobacter pylori [H. pylori] as the cause of diseases classified elsewhere: Secondary | ICD-10-CM | POA: Diagnosis not present

## 2020-06-15 DIAGNOSIS — D509 Iron deficiency anemia, unspecified: Secondary | ICD-10-CM | POA: Diagnosis not present

## 2020-06-15 DIAGNOSIS — R195 Other fecal abnormalities: Secondary | ICD-10-CM | POA: Diagnosis not present

## 2020-06-15 DIAGNOSIS — K635 Polyp of colon: Secondary | ICD-10-CM | POA: Diagnosis not present

## 2020-06-18 DIAGNOSIS — K2930 Chronic superficial gastritis without bleeding: Secondary | ICD-10-CM | POA: Diagnosis not present

## 2020-06-18 DIAGNOSIS — B9681 Helicobacter pylori [H. pylori] as the cause of diseases classified elsewhere: Secondary | ICD-10-CM | POA: Diagnosis not present

## 2020-06-18 DIAGNOSIS — D120 Benign neoplasm of cecum: Secondary | ICD-10-CM | POA: Diagnosis not present

## 2020-06-18 DIAGNOSIS — K635 Polyp of colon: Secondary | ICD-10-CM | POA: Diagnosis not present

## 2020-06-18 DIAGNOSIS — D122 Benign neoplasm of ascending colon: Secondary | ICD-10-CM | POA: Diagnosis not present

## 2020-06-22 DIAGNOSIS — C61 Malignant neoplasm of prostate: Secondary | ICD-10-CM | POA: Diagnosis not present

## 2020-07-03 DIAGNOSIS — Z8546 Personal history of malignant neoplasm of prostate: Secondary | ICD-10-CM | POA: Diagnosis not present

## 2020-07-03 DIAGNOSIS — N393 Stress incontinence (female) (male): Secondary | ICD-10-CM | POA: Diagnosis not present

## 2020-07-15 ENCOUNTER — Other Ambulatory Visit: Payer: Self-pay

## 2020-07-15 ENCOUNTER — Encounter: Payer: Self-pay | Admitting: Internal Medicine

## 2020-07-15 ENCOUNTER — Ambulatory Visit: Payer: PPO | Admitting: Internal Medicine

## 2020-07-15 VITALS — BP 132/64 | HR 55 | Ht 65.0 in | Wt 181.4 lb

## 2020-07-15 DIAGNOSIS — Z79899 Other long term (current) drug therapy: Secondary | ICD-10-CM | POA: Diagnosis not present

## 2020-07-15 DIAGNOSIS — R079 Chest pain, unspecified: Secondary | ICD-10-CM

## 2020-07-15 DIAGNOSIS — R7303 Prediabetes: Secondary | ICD-10-CM

## 2020-07-15 DIAGNOSIS — I2510 Atherosclerotic heart disease of native coronary artery without angina pectoris: Secondary | ICD-10-CM

## 2020-07-15 DIAGNOSIS — I10 Essential (primary) hypertension: Secondary | ICD-10-CM | POA: Diagnosis not present

## 2020-07-15 DIAGNOSIS — E785 Hyperlipidemia, unspecified: Secondary | ICD-10-CM | POA: Diagnosis not present

## 2020-07-15 DIAGNOSIS — Z951 Presence of aortocoronary bypass graft: Secondary | ICD-10-CM | POA: Diagnosis not present

## 2020-07-15 MED ORDER — CARVEDILOL 6.25 MG PO TABS: 6.2500 mg | ORAL_TABLET | Freq: Two times a day (BID) | ORAL | 3 refills | Status: DC

## 2020-07-15 NOTE — Patient Instructions (Signed)
Medication Instructions:  Per Dr. Margaretann Loveless- OKAY TO TAKE SUBLINGUAL NITROGLYCERIN AS NEEDED/PRESCRIBED FOR CHEST PRESSURE *If you need a refill on your cardiac medications before your next appointment, please call your pharmacy*  Follow-Up: At [facility], you and your health needs are our priority.  As part of our continuing mission to provide you with exceptional heart care, we have created designated Provider Care Teams.  These Care Teams include your primary Cardiologist (physician) and Advanced Practice Providers (APPs -  Physician Assistants and Nurse Practitioners) who all work together to provide you with the care you need, when you need it.  We recommend signing up for the patient portal called "MyChart".  Sign up information is provided on this After Visit Summary.  MyChart is used to connect with patients for Virtual Visits (Telemedicine).  Patients are able to view lab/test results, encounter notes, upcoming appointments, etc.  Non-urgent messages can be sent to your provider as well.   To learn more about what you can do with MyChart, go to [email or web address].    Your next appointment:   6 month(s)  The format for your next appointment:   In Person  Provider:   Cherlynn Kaiser, MD

## 2020-07-15 NOTE — Progress Notes (Signed)
Cardiology Office Note:    Date:  07/15/2020   ID:  Rodney Young, DOB [birth year], MRN [id number]  PCP:  Townsend Roger, MD  Cardiologist:  Elouise Munroe, MD  Electrophysiologist:  None   Referring MD: Nona Dell, Corene Cornea, MD   Chief Complaint/Reason for Referral: CAD s/p CABG  History of Present Illness:    Rodney Young is a 64 y.o. male with a history of history of CAD status post CABG (LIMA-LAD, left radial-OM, SVG-PDA) on 10/21/19, hypertension, hyperlipidemia.   He has been well overall, but notes occasional episodes of "a ton of bricks on my chest". This occurs at rest. 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 No reproducible left shoulder pain which was his pre-CABG anginal equivalent. As we discuss further he thinks it's related to GERD, or related to a pulled muscle. We discussed that this is concerning for angina.   He had normal ischemic evaluation with Dr. Gardiner Rhyme at the time of an acute care visit in June 2021 - nuclear stress test was normal. I have independently reviewed the still frame images and agree with the interpretation as no ischemia.   He has not tried any SL nitro yet for these episodes. He takes omeprazole, and uses tums frequently. He described his symptoms of GERD to me at a visit in June 2021. He does not feel that these make a significant impact on his chest pressure.   He has not tolerated imdur in the past. He is on BB with HR of 55.   He does not check his BP during these episodes to evaluate if they are related to hypertension.   Past Medical History:  Diagnosis Date  . Anxiety   . Cancer [facility])    prostate  . Coronary artery disease   . GERD (gastroesophageal reflux disease)   . Headache   . Hyperlipidemia   . Hypertension     Past Surgical History:  Procedure Laterality Date  . CORONARY ARTERY BYPASS GRAFT N/A 10/21/2019   Procedure: CORONARY ARTERY BYPASS GRAFTING (CABG) x three, using  left internal mammary artery, left radial artery harvested endoscopically and right greater saphenous vein harvested endoscopically;  Surgeon: Ivin Poot, MD;  Location: [place];  Service: Open Heart Surgery;  Laterality: N/A;  . LEFT HEART CATH AND CORONARY ANGIOGRAPHY N/A 10/14/2019   Procedure: LEFT HEART CATH AND CORONARY ANGIOGRAPHY;  Surgeon: Lorretta Harp, MD;  Location: [place] CV [facility];  Service: Cardiovascular;  Laterality: N/A;  . PROSTATECTOMY    . RADIAL ARTERY HARVEST Left 10/21/2019   Procedure: RIGHT RADIAL ARTERY HARVESTED ENDOSCOPICALLY;  Surgeon: Ivin Poot, MD;  Location: [place];  Service: Open Heart Surgery;  Laterality: Left;  . TEE WITHOUT CARDIOVERSION N/A 10/21/2019   Procedure: TRANSESOPHAGEAL ECHOCARDIOGRAM (TEE);  Surgeon: Prescott Gum, Collier Salina, MD;  Location: [place];  Service: Open Heart Surgery;  Laterality: N/A;    Current Medications: Current Meds  Medication Sig  . ALPRAZolam (XANAX) 0.5 MG tablet Take 0.5 mg by mouth at bedtime.   Marland Kitchen aspirin EC 325 MG EC tablet Take 1 tablet (325 mg total) by mouth daily.  Marland Kitchen atorvastatin (LIPITOR) 40 MG tablet Take 1 tablet (40 mg total) by mouth every other day. Lunch  . carvedilol (COREG) 6.25 MG tablet Take 1 tablet (6.25 mg total) by mouth 2 (two) times daily.  Marland Kitchen lisinopril-hydrochlorothiazide (ZESTORETIC) 10-12.5 MG tablet Take 1 tablet by mouth 2 (two) times  daily.  . omeprazole (PRILOSEC) 40 MG capsule Take 40 mg by mouth daily.  . vitamin B-12 (CYANOCOBALAMIN) 1000 MCG tablet Take 1,000 mcg by mouth daily.     Allergies:   Imdur [isosorbide nitrate]   Social History   Tobacco Use  . Smoking status: Never Smoker  . Smokeless tobacco: Never Used  Vaping Use  . Vaping Use: Never used  Substance Use Topics  . Alcohol use: Not Currently  . Drug use: Never     Family History: The patient's family history includes Heart attack in his father.  ROS:   Please see the history of present illness.    All  other systems reviewed and are negative.  EKGs/Labs/Other Studies Reviewed:    The following studies were reviewed today:  EKG:  Sinus bradycardia, rate 55 Compared to ECG 12/25/19, QRS duration has increased from 116 ms to 132 ms, and now ECG has nonspecific IVCD. Rhythm strip shows occasional PVCs and PACs.  I have independently reviewed the images from stress nuclear study 01/02/20.  Recent Labs: 12/25/2019: ALT 20; Magnesium 2.0 12/26/2019: BUN 17; Creatinine, Ser 1.36; Hemoglobin 9.6; Platelets 169; Potassium 3.7; Sodium 139  Recent Lipid Panel    Component Value Date/Time   CHOL 162 12/23/2019 0000   TRIG 90 12/23/2019 0000   HDL 44 12/23/2019 0000   CHOLHDL 3.7 12/23/2019 0000   LDLCALC 101 (H) 12/23/2019 0000    Physical Exam:    VS:  BP 132/64   Pulse (!) 55   Ht 5\' 5"  (1.651 m)   Wt 181 lb 6.4 oz (82.3 kg)   SpO2 98%   BMI 30.19 kg/m     Wt Readings from Last 5 Encounters:  07/15/20 181 lb 6.4 oz (82.3 kg)  01/23/20 175 lb 9.6 oz (79.7 kg)  01/02/20 174 lb (78.9 kg)  01/01/20 173 lb (78.5 kg)  12/25/19 174 lb 2.6 oz (79 kg)    Constitutional: No acute distress Eyes: sclera non-icteric, normal conjunctiva and lids ENMT: normal dentition, moist mucous membranes Cardiovascular: regular rhythm, normal rate, no murmurs. S1 and S2 normal. Radial pulses normal bilaterally. No jugular venous distention.  Respiratory: clear to auscultation bilaterally GI : normal bowel sounds, soft and nontender. No distention.   MSK: extremities warm, well perfused. No edema.  NEURO: grossly nonfocal exam, moves all extremities. PSYCH: alert and oriented x 3, normal mood and affect.   ASSESSMENT:    1. S/P CABG x 3   2. CAD in native artery   3. Chest pain of uncertain etiology   4. Essential hypertension   5. Hyperlipidemia, unspecified hyperlipidemia type   6. Prediabetes   7. Medication management    PLAN:    S/P CABG x 3 - Plan: EKG 12-Lead CAD in native artery Chest  pain of uncertain etiology - continues to have angina as previously described in visits this year. Normal stress myoview in June 2021. We discussed medication management of chronic CAD, which likely stems from native CAD given normal ischemic testing. I would like for him to try sublingual nitro since pain occurs occasionally, a couple times a week or less. He was concerned this was for emergency use only. We discussed prn nitro for antianginal therapy, and he understands and will try it at his next episode of chest pressure. ER precautions and nitro instructions given. Ok to also take famotidine if he feels his pain is GI in nature.  Essential hypertension - stable overall. Continue COREG, LISINIOPRIL HCTZ.  Hyperlipidemia, unspecified hyperlipidemia type - recheck lipids with PCP. On atorvastatin 40 mg daily. If LDL >70, would consider increasing atorvastatin to 80 mg if no prior intolerance.  Total time of encounter: 30 minutes total time of encounter, including 25 minutes spent in face-to-face patient care on the date of this encounter. This time includes coordination of care and counseling regarding above mentioned problem list. Remainder of non-face-to-face time involved reviewing chart documents/testing relevant to the patient encounter and documentation in the medical record. I have independently reviewed documentation from referring provider.   Cherlynn Kaiser, MD [place]  [facility]    Medication Adjustments/Labs and Tests Ordered: Current medicines are reviewed at length with the patient today.  Concerns regarding medicines are outlined above.   Orders Placed This Encounter  Procedures  . EKG 12-Lead    Meds ordered this encounter  Medications  . carvedilol (COREG) 6.25 MG tablet    Sig: Take 1 tablet (6.25 mg total) by mouth 2 (two) times daily.    Dispense:  180 tablet    Refill:  3    Patient Instructions  Medication Instructions:  Per Dr. Margaretann Loveless- OKAY TO  TAKE SUBLINGUAL NITROGLYCERIN AS NEEDED/PRESCRIBED FOR CHEST PRESSURE *If you need a refill on your cardiac medications before your next appointment, please call your pharmacy*  Follow-Up: At [facility], you and your health needs are our priority.  As part of our continuing mission to provide you with exceptional heart care, we have created designated Provider Care Teams.  These Care Teams include your primary Cardiologist (physician) and Advanced Practice Providers (APPs -  Physician Assistants and Nurse Practitioners) who all work together to provide you with the care you need, when you need it.  We recommend signing up for the patient portal called "MyChart".  Sign up information is provided on this After Visit Summary.  MyChart is used to connect with patients for Virtual Visits (Telemedicine).  Patients are able to view lab/test results, encounter notes, upcoming appointments, etc.  Non-urgent messages can be sent to your provider as well.   To learn more about what you can do with MyChart, go to [email or web address].    Your next appointment:   6 month(s)  The format for your next appointment:   In Person  Provider:   Cherlynn Kaiser, MD

## 2020-08-18 DIAGNOSIS — B9681 Helicobacter pylori [H. pylori] as the cause of diseases classified elsewhere: Secondary | ICD-10-CM | POA: Diagnosis not present

## 2020-09-10 DIAGNOSIS — I10 Essential (primary) hypertension: Secondary | ICD-10-CM | POA: Diagnosis not present

## 2020-09-10 DIAGNOSIS — I2510 Atherosclerotic heart disease of native coronary artery without angina pectoris: Secondary | ICD-10-CM | POA: Diagnosis not present

## 2020-09-10 DIAGNOSIS — K219 Gastro-esophageal reflux disease without esophagitis: Secondary | ICD-10-CM | POA: Diagnosis not present

## 2020-09-10 DIAGNOSIS — D509 Iron deficiency anemia, unspecified: Secondary | ICD-10-CM | POA: Diagnosis not present

## 2020-10-14 DIAGNOSIS — I10 Essential (primary) hypertension: Secondary | ICD-10-CM | POA: Diagnosis not present

## 2020-10-14 DIAGNOSIS — K219 Gastro-esophageal reflux disease without esophagitis: Secondary | ICD-10-CM | POA: Diagnosis not present

## 2020-11-09 ENCOUNTER — Other Ambulatory Visit: Payer: Self-pay | Admitting: Physician Assistant

## 2020-11-28 DIAGNOSIS — I10 Essential (primary) hypertension: Secondary | ICD-10-CM | POA: Diagnosis not present

## 2020-11-28 DIAGNOSIS — E785 Hyperlipidemia, unspecified: Secondary | ICD-10-CM | POA: Diagnosis not present

## 2020-11-30 DIAGNOSIS — H2513 Age-related nuclear cataract, bilateral: Secondary | ICD-10-CM | POA: Diagnosis not present

## 2020-12-14 ENCOUNTER — Telehealth: Payer: Self-pay | Admitting: Internal Medicine

## 2020-12-14 DIAGNOSIS — I10 Essential (primary) hypertension: Secondary | ICD-10-CM | POA: Diagnosis not present

## 2020-12-14 DIAGNOSIS — Z8546 Personal history of malignant neoplasm of prostate: Secondary | ICD-10-CM | POA: Diagnosis not present

## 2020-12-14 DIAGNOSIS — E7800 Pure hypercholesterolemia, unspecified: Secondary | ICD-10-CM | POA: Diagnosis not present

## 2020-12-14 DIAGNOSIS — Z1331 Encounter for screening for depression: Secondary | ICD-10-CM | POA: Diagnosis not present

## 2020-12-14 DIAGNOSIS — Z6830 Body mass index (BMI) 30.0-30.9, adult: Secondary | ICD-10-CM | POA: Diagnosis not present

## 2020-12-14 DIAGNOSIS — D519 Vitamin B12 deficiency anemia, unspecified: Secondary | ICD-10-CM | POA: Diagnosis not present

## 2020-12-14 DIAGNOSIS — D509 Iron deficiency anemia, unspecified: Secondary | ICD-10-CM | POA: Diagnosis not present

## 2020-12-14 DIAGNOSIS — I25810 Atherosclerosis of coronary artery bypass graft(s) without angina pectoris: Secondary | ICD-10-CM | POA: Diagnosis not present

## 2020-12-14 DIAGNOSIS — E669 Obesity, unspecified: Secondary | ICD-10-CM | POA: Diagnosis not present

## 2020-12-14 DIAGNOSIS — Z1339 Encounter for screening examination for other mental health and behavioral disorders: Secondary | ICD-10-CM | POA: Diagnosis not present

## 2020-12-14 DIAGNOSIS — Z0000 Encounter for general adult medical examination without abnormal findings: Secondary | ICD-10-CM | POA: Diagnosis not present

## 2020-12-14 MED ORDER — CARVEDILOL 6.25 MG PO TABS: 6.2500 mg | ORAL_TABLET | Freq: Two times a day (BID) | ORAL | 3 refills | Status: DC

## 2020-12-14 NOTE — Telephone Encounter (Signed)
*  STAT* If patient is at the pharmacy, call can be transferred to refill team.   1. Which medications need to be refilled? (please list name of each medication and dose if known) carvedilol (COREG) 6.25 MG tablet  2. Which pharmacy/location (including street and city if local pharmacy) is medication to be sent to? [place], [place] - [address]  3. Do they need a 30 day or 90 day supply? 90 day supply   Only has enough for 3 days into next week.

## 2021-01-15 ENCOUNTER — Encounter: Payer: Self-pay | Admitting: Internal Medicine

## 2021-01-15 ENCOUNTER — Ambulatory Visit: Payer: PPO | Admitting: Internal Medicine

## 2021-01-15 ENCOUNTER — Other Ambulatory Visit: Payer: Self-pay

## 2021-01-15 VITALS — BP 156/90 | HR 64 | Ht 65.0 in | Wt 183.0 lb

## 2021-01-15 DIAGNOSIS — I2510 Atherosclerotic heart disease of native coronary artery without angina pectoris: Secondary | ICD-10-CM

## 2021-01-15 DIAGNOSIS — Z951 Presence of aortocoronary bypass graft: Secondary | ICD-10-CM | POA: Diagnosis not present

## 2021-01-15 DIAGNOSIS — R079 Chest pain, unspecified: Secondary | ICD-10-CM | POA: Diagnosis not present

## 2021-01-15 DIAGNOSIS — I10 Essential (primary) hypertension: Secondary | ICD-10-CM

## 2021-01-15 DIAGNOSIS — Z79899 Other long term (current) drug therapy: Secondary | ICD-10-CM | POA: Diagnosis not present

## 2021-01-15 DIAGNOSIS — R7303 Prediabetes: Secondary | ICD-10-CM | POA: Diagnosis not present

## 2021-01-15 DIAGNOSIS — E785 Hyperlipidemia, unspecified: Secondary | ICD-10-CM

## 2021-01-15 MED ORDER — ASPIRIN 81 MG PO TBEC: 81.0000 mg | DELAYED_RELEASE_TABLET | Freq: Every day | ORAL | 12 refills | Status: AC

## 2021-01-15 NOTE — Patient Instructions (Addendum)
Medication Instructions:   No changes except  decrease Aspirin to 81 mg daily  enteric coated  ( by over the counter  *If you need a refill on your cardiac medications before your next appointment, please call your pharmacy*   Lab Work:  Not needed   Testing/Procedures: Not needed   Follow-Up: At [facility], you and your health needs are our priority.  As part of our continuing mission to provide you with exceptional heart care, we have created designated Provider Care Teams.  These Care Teams include your primary Cardiologist (physician) and Advanced Practice Providers (APPs -  Physician Assistants and Nurse Practitioners) who all work together to provide you with the care you need, when you need it.  We recommend signing up for the patient portal called "MyChart".  Sign up information is provided on this After Visit Summary.  MyChart is used to connect with patients for Virtual Visits (Telemedicine).  Patients are able to view lab/test results, encounter notes, upcoming appointments, etc.  Non-urgent messages can be sent to your provider as well.   To learn more about what you can do with MyChart, go to [email or web address].    Your next appointment:   12 month(s)  The format for your next appointment:   In Person  Provider:   Cherlynn Kaiser, MD

## 2021-01-15 NOTE — Progress Notes (Addendum)
Cardiology Office Note:    Date:  01/15/2021   ID:  Rodney Young, DOB Jun 30, 1956, MRN [id number]  PCP:  Townsend Roger, MD  Cardiologist:  Elouise Munroe, MD  Electrophysiologist:  None   Referring MD: Nona Dell, Corene Cornea, MD   Chief Complaint/Reason for Referral: CAD s/p CABG  History of Present Illness:    Rodney Young is a 65 y.o. male with a history of history of CAD status post CABG (LIMA-LAD, left radial-OM, SVG-PDA) on 10/21/19, hypertension, hyperlipidemia.   Today, he is feeling all right overall. He continues to have central chest pain that is localized near the surface of his skin. The area is mostly sore, and seems to worsen by the end of the day. He describes it as feeling like a bee sting. Of note, if he raises his left UE he feels more pain, and feels no pain if he raises his right UE. He has tried topical treatments but had no improvement. I discussed with him that he is likely experiencing nerve pain due to previous sternotomy. With palpation I was unable to reproduce his chest pain.  At home this morning his blood pressure was 125/80. At clinic today it is 156/90, however he states he mowed the lawn this morning. If he is leaning over or on his knees gardening, he will get lightheaded upon standing.  Typically he takes his cholesterol medication with lunch. However, he is experiencing side effects of headaches. He has tried taking it at night, but he will wake up several times with the headache.  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  He denies any shortness of breath, palpitations, or exertional symptoms. No syncope to report. Also has no lower extremity edema, orthopnea or PND.   Past Medical History:  Diagnosis Date   Anxiety    Cancer [facility])    prostate   Coronary artery disease    GERD (gastroesophageal reflux disease)    Headache    Hyperlipidemia    Hypertension     Past Surgical History:   Procedure Laterality Date   CORONARY ARTERY BYPASS GRAFT N/A 10/21/2019   Procedure: CORONARY ARTERY BYPASS GRAFTING (CABG) x three, using left internal mammary artery, left radial artery harvested endoscopically and right greater saphenous vein harvested endoscopically;  Surgeon: Ivin Poot, MD;  Location: [place];  Service: Open Heart Surgery;  Laterality: N/A;   LEFT HEART CATH AND CORONARY ANGIOGRAPHY N/A 10/14/2019   Procedure: LEFT HEART CATH AND CORONARY ANGIOGRAPHY;  Surgeon: Lorretta Harp, MD;  Location: [place] [facility];  Service: Cardiovascular;  Laterality: N/A;   PROSTATECTOMY     RADIAL ARTERY HARVEST Left 10/21/2019   Procedure: RIGHT RADIAL ARTERY HARVESTED ENDOSCOPICALLY;  Surgeon: Ivin Poot, MD;  Location: [place];  Service: Open Heart Surgery;  Laterality: Left;   TEE WITHOUT CARDIOVERSION N/A 10/21/2019   Procedure: TRANSESOPHAGEAL ECHOCARDIOGRAM (TEE);  Surgeon: Prescott Gum, Collier Salina, MD;  Location: [place];  Service: Open Heart Surgery;  Laterality: N/A;    Current Medications: Current Meds  Medication Sig   ALPRAZolam (XANAX) 0.5 MG tablet Take 0.5 mg by mouth at bedtime.    amLODipine (NORVASC) 5 MG tablet Take 1 tablet by mouth daily.   aspirin EC 325 MG EC tablet Take 1 tablet (325 mg total) by mouth daily.   atorvastatin (LIPITOR) 80 MG tablet Take 1 tablet by mouth at bedtime.   carvedilol (COREG) 6.25 MG tablet  Take 1 tablet (6.25 mg total) by mouth 2 (two) times daily.   famotidine (PEPCID) 40 MG tablet Take 40 mg by mouth 2 (two) times daily.   lisinopril (ZESTRIL) 40 MG tablet Take 40 mg by mouth daily.   nitroGLYCERIN (NITROSTAT) 0.4 MG SL tablet See admin instructions.   omeprazole (PRILOSEC) 40 MG capsule Take 40 mg by mouth daily.   vitamin B-12 (CYANOCOBALAMIN) 1000 MCG tablet Take 1,000 mcg by mouth daily.   [DISCONTINUED] atorvastatin (LIPITOR) 40 MG tablet TAKE 1 TABLET BY MOUTH EVERY OTHER DAY AT  LUNCH   [DISCONTINUED]  lisinopril-hydrochlorothiazide (ZESTORETIC) 10-12.5 MG tablet Take 1 tablet by mouth 2 (two) times daily.     Allergies:   Imdur [isosorbide nitrate]   Social History   Tobacco Use   Smoking status: Never   Smokeless tobacco: Never  Vaping Use   Vaping Use: Never used  Substance Use Topics   Alcohol use: Not Currently   Drug use: Never     Family History: The patient's family history includes Heart attack in his father.  ROS:   Please see the history of present illness.    (+) Central chest pain near skin surface (+) Lightheadedness (+) Headaches All other systems reviewed and are negative.  EKGs/Labs/Other Studies Reviewed:    The following studies were reviewed today:  Lexiscan Myoview 01/02/2020: Nuclear stress EF: 60%. The left ventricular ejection fraction is normal (55-65%). There was no ST segment deviation noted during stress. The study is normal. This is a low risk study.   Normal stress nuclear study with no ischemia or infarction.  Gated ejection fraction 60% with normal wall motion.  Echo 12/26/2019:  1. Left ventricular ejection fraction, by estimation, is 60 to 65%. The  left ventricle has normal function. The left ventricle has no regional  wall motion abnormalities. Left ventricular diastolic parameters were  normal.   2. Right ventricular systolic function is normal. The right ventricular  size is normal.   3. The mitral valve is grossly normal. Trivial mitral valve  regurgitation.   4. The aortic valve is tricuspid. Aortic valve regurgitation is not  visualized.   5. The inferior vena cava is normal in size with greater than 50%  respiratory variability, suggesting right atrial pressure of 3 mmHg.   Intraoperative TEE 11/22/5359: Complications: No known complications during this procedure.  POST-OP IMPRESSIONS  - Left Ventricle: The left ventricle is unchanged from pre-bypass.  - Aorta: The aorta appears unchanged from pre-bypass.  - Left  Atrial Appendage: The left atrial appendage appears unchanged from  pre-bypass.  - Aortic Valve: The aortic valve appears unchanged from pre-bypass.  - Mitral Valve: There is trivial regurgitation.  - Tricuspid Valve: There is trivial regurgitation.  - Interatrial Septum: The interatrial septum appears unchanged from  pre-bypass.  - Pericardium: The pericardium appears unchanged from pre-bypass.  US Doppler Pre-CABG 10/17/2019: Right Carotid: Velocities in the right ICA are consistent with a 40-59%                 stenosis.   Left Carotid: Velocities in the left ICA are consistent with a 1-39%  stenosis.   Vertebrals:  Bilateral vertebral arteries demonstrate antegrade flow.  Subclavians: Normal flow hemodynamics were seen in bilateral subclavian arteries.   Bilateral ABI: Bilateral ankle-brachial indexes are within normal range.  No evidence of significant lower extremity arterial disease.  Bilateral Extremity: Doppler waveforms remain within normal limits with  compression bilaterally for the radial arteries. Doppler  waveforms remain  within normal limits with compression bilaterally for the ulnar arteries.   LHC 10/14/2019: Dist RCA lesion is 90% stenosed. Prox Cx to Mid Cx lesion is 80% stenosed. Mid LAD lesion is 75% stenosed. The left ventricular systolic function is normal. LV end diastolic pressure is normal. The left ventricular ejection fraction is 55-65% by visual estimate.  ETT 10/08/2019: Blood pressure demonstrated a normal response to exercise. Downsloping ST segment depression ST segment depression of 2 mm was noted during stress in the II, III and aVF leads, beginning at 4 minutes of stress, and returning to baseline after 1-5 minutes of recovery. High risk stress test based on Duke Treadmill Score. Positive stress test.   EKG:   01/15/2021: NSR, PAC's, rate 64 bpm 07/15/2020: Sinus bradycardia, rate 55   Compared to ECG 12/25/19, QRS duration has increased from  116 ms to 132 ms, and now ECG has nonspecific IVCD. Rhythm strip shows occasional PVCs and PACs.  I have independently reviewed the images from stress nuclear study 01/02/20.  Recent Labs: No results found for requested labs within last 8760 hours.  Recent Lipid Panel    Component Value Date/Time   CHOL 162 12/23/2019 0000   TRIG 90 12/23/2019 0000   HDL 44 12/23/2019 0000   CHOLHDL 3.7 12/23/2019 0000   LDLCALC 101 (H) 12/23/2019 0000    Physical Exam:    VS:  BP (!) 156/90   Pulse 64   Ht 5\' 5"  (1.651 m)   Wt 183 lb (83 kg)   SpO2 98%   BMI 30.45 kg/m     Wt Readings from Last 5 Encounters:  01/15/21 183 lb (83 kg)  07/15/20 181 lb 6.4 oz (82.3 kg)  01/23/20 175 lb 9.6 oz (79.7 kg)  01/02/20 174 lb (78.9 kg)  01/01/20 173 lb (78.5 kg)    Constitutional: No acute distress Eyes: sclera non-icteric, normal conjunctiva and lids ENMT: normal dentition, moist mucous membranes Cardiovascular: regular rhythm, normal rate, no murmurs. S1 and S2 normal. Radial pulses normal bilaterally. No jugular venous distention.  Respiratory: clear to auscultation bilaterally GI : normal bowel sounds, soft and nontender. No distention.   MSK: extremities warm, well perfused. No edema.  NEURO: grossly nonfocal exam, moves all extremities. PSYCH: alert and oriented x 3, normal mood and affect.   ASSESSMENT:    1. S/P CABG x 3   2. CAD in native artery   3. Chest pain of uncertain etiology   4. Essential hypertension   5. Hyperlipidemia, unspecified hyperlipidemia type   6. Prediabetes   7. Medication management     PLAN:    S/P CABG x 3 - Plan: EKG 12-Lead CAD in native artery Chest pain of uncertain etiology - seems primarily post-incisional pain now or MSK. Recommend prn nitro, prn H2 blocker or topical therapy, whatever seems to work best for pain.  - decrease ASA to 81 mg daily and continue ASA - continue BB, statin.   Essential hypertension - Elevated today, continue to  monitor. Continue COREG, LISINIOPRIL. - HCTZ appears to have been stopped for renal dysfunction, he may need additional antiHTN therapy however he notes some orthostasis. Primarily being managed by PCP, happy to assist as needed.  Hyperlipidemia, unspecified hyperlipidemia type - on lipitor 80 mg daily.   Follow-up in 1 year.  Total time of encounter: 30 minutes total time of encounter, including 20 minutes spent in face-to-face patient care on the date of this encounter. This time includes coordination  of care and counseling regarding above mentioned problem list. Remainder of non-face-to-face time involved reviewing chart documents/testing relevant to the patient encounter and documentation in the medical record. I have independently reviewed documentation from referring provider.   Cherlynn Kaiser, MD [place]  [facility]    Medication Adjustments/Labs and Tests Ordered: Current medicines are reviewed at length with the patient today.  Concerns regarding medicines are outlined above.   Orders Placed This Encounter  Procedures   EKG 12-Lead     Meds ordered this encounter  Medications   aspirin 81 MG EC tablet    Sig: Take 1 tablet (81 mg total) by mouth daily. Swallow whole.    Dispense:  30 tablet    Refill:  12     Patient Instructions  Medication Instructions:   No changes except  decrease Aspirin to 81 mg daily  enteric coated  ( by over the counter  *If you need a refill on your cardiac medications before your next appointment, please call your pharmacy*   Lab Work:  Not needed   Testing/Procedures: Not needed   Follow-Up: At [facility], you and your health needs are our priority.  As part of our continuing mission to provide you with exceptional heart care, we have created designated Provider Care Teams.  These Care Teams include your primary Cardiologist (physician) and Advanced Practice Providers (APPs -  Physician Assistants and Nurse  Practitioners) who all work together to provide you with the care you need, when you need it.  We recommend signing up for the patient portal called "MyChart".  Sign up information is provided on this After Visit Summary.  MyChart is used to connect with patients for Virtual Visits (Telemedicine).  Patients are able to view lab/test results, encounter notes, upcoming appointments, etc.  Non-urgent messages can be sent to your provider as well.   To learn more about what you can do with MyChart, go to [email or web address].    Your next appointment:   12 month(s)  The format for your next appointment:   In Person  Provider:   Cherlynn Kaiser, MD      [facility] Stumpf,acting as a scribe for Elouise Munroe, MD.,have documented all relevant documentation on the behalf of Elouise Munroe, MD,as directed by  Elouise Munroe, MD while in the presence of Elouise Munroe, MD.  I, Elouise Munroe, MD, have reviewed all documentation for this visit. The documentation on 01/15/21 for the exam, diagnosis, procedures, and orders are all accurate and complete.

## 2021-03-01 ENCOUNTER — Other Ambulatory Visit: Payer: Self-pay | Admitting: Urology

## 2021-03-17 DIAGNOSIS — L989 Disorder of the skin and subcutaneous tissue, unspecified: Secondary | ICD-10-CM | POA: Diagnosis not present

## 2021-03-17 DIAGNOSIS — E7800 Pure hypercholesterolemia, unspecified: Secondary | ICD-10-CM | POA: Diagnosis not present

## 2021-03-17 DIAGNOSIS — I10 Essential (primary) hypertension: Secondary | ICD-10-CM | POA: Diagnosis not present

## 2021-03-18 DIAGNOSIS — E7800 Pure hypercholesterolemia, unspecified: Secondary | ICD-10-CM | POA: Diagnosis not present

## 2021-03-31 DIAGNOSIS — I10 Essential (primary) hypertension: Secondary | ICD-10-CM | POA: Diagnosis not present

## 2021-03-31 DIAGNOSIS — E7800 Pure hypercholesterolemia, unspecified: Secondary | ICD-10-CM | POA: Diagnosis not present

## 2021-04-23 DIAGNOSIS — C44519 Basal cell carcinoma of skin of other part of trunk: Secondary | ICD-10-CM | POA: Diagnosis not present

## 2021-04-23 DIAGNOSIS — L821 Other seborrheic keratosis: Secondary | ICD-10-CM | POA: Diagnosis not present

## 2021-05-12 DIAGNOSIS — H2513 Age-related nuclear cataract, bilateral: Secondary | ICD-10-CM | POA: Diagnosis not present

## 2021-05-20 DIAGNOSIS — C44519 Basal cell carcinoma of skin of other part of trunk: Secondary | ICD-10-CM | POA: Diagnosis not present

## 2021-06-18 DIAGNOSIS — E785 Hyperlipidemia, unspecified: Secondary | ICD-10-CM | POA: Diagnosis not present

## 2021-06-18 DIAGNOSIS — I10 Essential (primary) hypertension: Secondary | ICD-10-CM | POA: Diagnosis not present

## 2021-06-18 DIAGNOSIS — Z23 Encounter for immunization: Secondary | ICD-10-CM | POA: Diagnosis not present

## 2021-07-01 DIAGNOSIS — L814 Other melanin hyperpigmentation: Secondary | ICD-10-CM | POA: Diagnosis not present

## 2021-07-01 DIAGNOSIS — L578 Other skin changes due to chronic exposure to nonionizing radiation: Secondary | ICD-10-CM | POA: Diagnosis not present

## 2021-07-01 DIAGNOSIS — C44519 Basal cell carcinoma of skin of other part of trunk: Secondary | ICD-10-CM | POA: Diagnosis not present

## 2021-07-09 DIAGNOSIS — Z8546 Personal history of malignant neoplasm of prostate: Secondary | ICD-10-CM | POA: Diagnosis not present

## 2021-07-09 DIAGNOSIS — N393 Stress incontinence (female) (male): Secondary | ICD-10-CM | POA: Diagnosis not present

## 2021-08-13 DIAGNOSIS — R350 Frequency of micturition: Secondary | ICD-10-CM | POA: Diagnosis not present

## 2021-08-13 DIAGNOSIS — N393 Stress incontinence (female) (male): Secondary | ICD-10-CM | POA: Diagnosis not present

## 2021-09-03 DIAGNOSIS — N3941 Urge incontinence: Secondary | ICD-10-CM | POA: Diagnosis not present

## 2021-09-17 DIAGNOSIS — N393 Stress incontinence (female) (male): Secondary | ICD-10-CM | POA: Diagnosis not present

## 2021-09-17 DIAGNOSIS — R350 Frequency of micturition: Secondary | ICD-10-CM | POA: Diagnosis not present

## 2021-09-17 DIAGNOSIS — N3946 Mixed incontinence: Secondary | ICD-10-CM | POA: Diagnosis not present

## 2021-09-23 DIAGNOSIS — N1831 Chronic kidney disease, stage 3a: Secondary | ICD-10-CM | POA: Diagnosis not present

## 2021-09-23 DIAGNOSIS — I25810 Atherosclerosis of coronary artery bypass graft(s) without angina pectoris: Secondary | ICD-10-CM | POA: Diagnosis not present

## 2021-09-23 DIAGNOSIS — Z8546 Personal history of malignant neoplasm of prostate: Secondary | ICD-10-CM | POA: Diagnosis not present

## 2021-09-23 DIAGNOSIS — Z1331 Encounter for screening for depression: Secondary | ICD-10-CM | POA: Diagnosis not present

## 2021-09-23 DIAGNOSIS — Z0000 Encounter for general adult medical examination without abnormal findings: Secondary | ICD-10-CM | POA: Diagnosis not present

## 2021-09-23 DIAGNOSIS — I10 Essential (primary) hypertension: Secondary | ICD-10-CM | POA: Diagnosis not present

## 2021-09-23 DIAGNOSIS — E669 Obesity, unspecified: Secondary | ICD-10-CM | POA: Diagnosis not present

## 2021-09-23 DIAGNOSIS — E7800 Pure hypercholesterolemia, unspecified: Secondary | ICD-10-CM | POA: Diagnosis not present

## 2021-09-23 DIAGNOSIS — Z6830 Body mass index (BMI) 30.0-30.9, adult: Secondary | ICD-10-CM | POA: Diagnosis not present

## 2021-10-29 DIAGNOSIS — N3946 Mixed incontinence: Secondary | ICD-10-CM | POA: Diagnosis not present

## 2021-11-23 ENCOUNTER — Encounter: Payer: Self-pay | Admitting: *Deleted

## 2021-12-08 DIAGNOSIS — R350 Frequency of micturition: Secondary | ICD-10-CM | POA: Diagnosis not present

## 2021-12-08 DIAGNOSIS — N3946 Mixed incontinence: Secondary | ICD-10-CM | POA: Diagnosis not present

## 2021-12-13 ENCOUNTER — Other Ambulatory Visit: Payer: Self-pay | Admitting: Internal Medicine

## 2021-12-14 DIAGNOSIS — H2513 Age-related nuclear cataract, bilateral: Secondary | ICD-10-CM | POA: Diagnosis not present

## 2021-12-20 ENCOUNTER — Telehealth: Payer: Self-pay | Admitting: Internal Medicine

## 2021-12-20 NOTE — Telephone Encounter (Signed)
*  STAT* If patient is at the pharmacy, call can be transferred to refill team.   1. Which medications need to be refilled? (please list name of each medication and dose if known)   carvedilol (COREG) 6.25 MG tablet    2. Which pharmacy/location (including street and city if local pharmacy) is medication to be sent to? [place], [place] - [address]  3. Do they need a 30 day or 90 day supply?  90 day

## 2021-12-21 ENCOUNTER — Other Ambulatory Visit: Payer: Self-pay

## 2021-12-21 ENCOUNTER — Telehealth: Payer: Self-pay | Admitting: Internal Medicine

## 2021-12-21 MED ORDER — CARVEDILOL 6.25 MG PO TABS: 6.2500 mg | ORAL_TABLET | Freq: Two times a day (BID) | ORAL | 0 refills | Status: DC

## 2021-12-21 NOTE — Telephone Encounter (Signed)
Pt c/o BP issue: STAT if pt c/o blurred vision, one-sided weakness or slurred speech  1. What are your last 5 BP readings? 188/92, 186/88 185/83  2. Are you having any other symptoms (ex. Dizziness, headache, blurred vision, passed out)? Dizziness and headache-  3. What is your BP issue? High blood pressure- patient says he have not had his medicine since  Saturday

## 2021-12-21 NOTE — Telephone Encounter (Signed)
Refill sent to pharmacy.   

## 2021-12-21 NOTE — Telephone Encounter (Signed)
Called pt. He states "my blood pressure has been good up until Saturday." A refill for Coreg has been sent to pharmacy. Pt states he will keep his appt next month.

## 2021-12-21 NOTE — Telephone Encounter (Signed)
*  STAT* If patient is at the pharmacy, call can be transferred to refill team.   1. Which medications need to be refilled? (please list name of each medication and dose if known)   carvedilol (COREG) 6.25 MG tablet    2. Which pharmacy/location (including street and city if local pharmacy) is medication to be sent to?  Take 1 tablet (6.25 mg total) by mouth 2 (two) times daily. 3. Do they need a 30 day or 90 day supply?  90 day   Pharmacy states that pt says he's completely out of medication. Please advise

## 2021-12-21 NOTE — Telephone Encounter (Signed)
*  STAT* If patient is at the pharmacy, call can be transferred to refill team.   1. Which medications need to be refilled? (please list name of each medication and dose if known) Carvedilol  2. Which pharmacy/location (including street and city if local pharmacy) is medication to be sent to?[facility] [place]  3. Do they need a 30 day or 90 day supply? 90 days and refills- patient needs this asap, out of it. His blood pressure iis running high

## 2021-12-24 DIAGNOSIS — N1831 Chronic kidney disease, stage 3a: Secondary | ICD-10-CM | POA: Diagnosis not present

## 2021-12-24 DIAGNOSIS — I10 Essential (primary) hypertension: Secondary | ICD-10-CM | POA: Diagnosis not present

## 2021-12-24 DIAGNOSIS — I25810 Atherosclerosis of coronary artery bypass graft(s) without angina pectoris: Secondary | ICD-10-CM | POA: Diagnosis not present

## 2021-12-24 DIAGNOSIS — E7800 Pure hypercholesterolemia, unspecified: Secondary | ICD-10-CM | POA: Diagnosis not present

## 2021-12-29 DIAGNOSIS — E7800 Pure hypercholesterolemia, unspecified: Secondary | ICD-10-CM | POA: Diagnosis not present

## 2022-01-06 DIAGNOSIS — L821 Other seborrheic keratosis: Secondary | ICD-10-CM | POA: Diagnosis not present

## 2022-01-06 DIAGNOSIS — L578 Other skin changes due to chronic exposure to nonionizing radiation: Secondary | ICD-10-CM | POA: Diagnosis not present

## 2022-01-06 DIAGNOSIS — L570 Actinic keratosis: Secondary | ICD-10-CM | POA: Diagnosis not present

## 2022-01-06 DIAGNOSIS — C44519 Basal cell carcinoma of skin of other part of trunk: Secondary | ICD-10-CM | POA: Diagnosis not present

## 2022-01-12 DIAGNOSIS — R350 Frequency of micturition: Secondary | ICD-10-CM | POA: Diagnosis not present

## 2022-01-12 DIAGNOSIS — N3946 Mixed incontinence: Secondary | ICD-10-CM | POA: Diagnosis not present

## 2022-01-17 ENCOUNTER — Encounter: Payer: Self-pay | Admitting: Internal Medicine

## 2022-01-17 ENCOUNTER — Ambulatory Visit (INDEPENDENT_AMBULATORY_CARE_PROVIDER_SITE_OTHER): Payer: PPO | Admitting: Internal Medicine

## 2022-01-17 VITALS — BP 158/82 | HR 54 | Ht 65.0 in | Wt 180.8 lb

## 2022-01-17 DIAGNOSIS — R7303 Prediabetes: Secondary | ICD-10-CM

## 2022-01-17 DIAGNOSIS — Z951 Presence of aortocoronary bypass graft: Secondary | ICD-10-CM | POA: Diagnosis not present

## 2022-01-17 DIAGNOSIS — Z79899 Other long term (current) drug therapy: Secondary | ICD-10-CM

## 2022-01-17 DIAGNOSIS — E785 Hyperlipidemia, unspecified: Secondary | ICD-10-CM

## 2022-01-17 DIAGNOSIS — R079 Chest pain, unspecified: Secondary | ICD-10-CM | POA: Diagnosis not present

## 2022-01-17 DIAGNOSIS — I10 Essential (primary) hypertension: Secondary | ICD-10-CM | POA: Diagnosis not present

## 2022-01-17 DIAGNOSIS — I2510 Atherosclerotic heart disease of native coronary artery without angina pectoris: Secondary | ICD-10-CM

## 2022-01-17 MED ORDER — AMLODIPINE BESYLATE 10 MG PO TABS: 10.0000 mg | ORAL_TABLET | Freq: Every day | ORAL | 3 refills | Status: DC

## 2022-01-17 MED ORDER — CARVEDILOL 6.25 MG PO TABS: 6.2500 mg | ORAL_TABLET | Freq: Two times a day (BID) | ORAL | 3 refills | Status: DC

## 2022-01-17 NOTE — Patient Instructions (Addendum)
Medication Instructions:  CARVEDILOL REFILLED   PLEASE INCREASE AMLODIPINE TO '10mg'$  ONCE DAILY  YOU CAN TAKE 2 OF YOUR '5mg'$  TABLETS UNTIL YOU RUN OUT- THEN PICK UP '10mg'$  TABLETS FROM THE PHARMACY   *If you need a refill on your cardiac medications before your next appointment, please call your pharmacy*  Follow-Up: At [facility], you and your health needs are our priority.  As part of our continuing mission to provide you with exceptional heart care, we have created designated Provider Care Teams.  These Care Teams include your primary Cardiologist (physician) and Advanced Practice Providers (APPs -  Physician Assistants and Nurse Practitioners) who all work together to provide you with the care you need, when you need it.  Your next appointment:   1 year(s)  The format for your next appointment:   In Person  Provider:   Elouise Munroe, MD

## 2022-01-17 NOTE — Progress Notes (Addendum)
Cardiology Office Note:    Date:  01/17/2022   ID:  Rodney Young, DOB Jan 07, 1956, MRN [id number]  PCP:  Townsend Roger, MD  Cardiologist:  Elouise Munroe, MD  Electrophysiologist:  None   Referring MD: Nona Dell, Corene Cornea, MD   Chief Complaint/Reason for Referral: CAD s/p CABG  History of Present Illness:    Rodney Young is a 66 y.o. male with a history of history of CAD status post CABG (LIMA-LAD, left radial-OM, SVG-PDA) on 10/21/19, hypertension, hyperlipidemia.   He has been doing well, and currently does activities to stay busy like mowing the lawn and going to the flea market. He states that he still has the same problems with his chest and shoulder/rotator cuff. He feels it was secondary to a flu shot. He adds that the pain is not in the joints of his arm. The pain in his chest or shoulder discomfort is worsened when he raises his arm for a long period of time. There is certainly a MSK component to his chest/shoulder pain.   He puts pain cream on his shoulder to manage the discomfort, and he is able to lift around 50 lbs. This does not completely resolve his pain, but it lowers the severity of it.  He was lightheaded when he stopped taking his medication due to being out of refills. At home, his blood pressure is around 150 mmHg.   His diet includes cereal in the mornings as of now, and has been avoiding eggs and bacon. When he took [place], he was unable to tolerate it.   The patient denies shortness of breath, nocturnal dyspnea, orthopnea or peripheral edema.  There have been no palpitations or syncope.  Past Medical History:  Diagnosis Date   Anxiety    Cancer [facility])    prostate   Coronary artery disease    GERD (gastroesophageal reflux disease)    Headache    Hyperlipidemia    Hypertension     Past Surgical History:  Procedure Laterality Date   CORONARY ARTERY BYPASS GRAFT N/A 10/21/2019   Procedure: CORONARY ARTERY BYPASS GRAFTING (CABG) x three, using left  internal mammary artery, left radial artery harvested endoscopically and right greater saphenous vein harvested endoscopically;  Surgeon: Ivin Poot, MD;  Location: [place];  Service: Open Heart Surgery;  Laterality: N/A;   LEFT HEART CATH AND CORONARY ANGIOGRAPHY N/A 10/14/2019   Procedure: LEFT HEART CATH AND CORONARY ANGIOGRAPHY;  Surgeon: Lorretta Harp, MD;  Location: [place] [facility];  Service: Cardiovascular;  Laterality: N/A;   PROSTATECTOMY     RADIAL ARTERY HARVEST Left 10/21/2019   Procedure: RIGHT RADIAL ARTERY HARVESTED ENDOSCOPICALLY;  Surgeon: Ivin Poot, MD;  Location: [place];  Service: Open Heart Surgery;  Laterality: Left;   TEE WITHOUT CARDIOVERSION N/A 10/21/2019   Procedure: TRANSESOPHAGEAL ECHOCARDIOGRAM (TEE);  Surgeon: Prescott Gum, Collier Salina, MD;  Location: [place];  Service: Open Heart Surgery;  Laterality: N/A;    Current Medications: Current Meds  Medication Sig   ALPRAZolam (XANAX) 0.5 MG tablet Take 0.5 mg by mouth at bedtime.    aspirin 81 MG EC tablet Take 1 tablet (81 mg total) by mouth daily. Swallow whole.   atorvastatin (LIPITOR) 80 MG tablet Take 1 tablet by mouth at bedtime.   ezetimibe (ZETIA) 10 MG tablet Take 10 mg by mouth daily.   famotidine (PEPCID) 40 MG tablet Take 40 mg by mouth 2 (two) times daily.   lisinopril (ZESTRIL) 40 MG tablet Take 40  mg by mouth daily.   nitroGLYCERIN (NITROSTAT) 0.4 MG SL tablet See admin instructions.   omeprazole (PRILOSEC) 40 MG capsule Take 40 mg by mouth daily.   vitamin B-12 (CYANOCOBALAMIN) 1000 MCG tablet Take 1,000 mcg by mouth daily.   [DISCONTINUED] amLODipine (NORVASC) 5 MG tablet Take 1 tablet by mouth daily.   [DISCONTINUED] carvedilol (COREG) 6.25 MG tablet Take 1 tablet (6.25 mg total) by mouth 2 (two) times daily. Please keep your appt 6/19     Allergies:   Imdur [isosorbide nitrate]   Social History   Tobacco Use   Smoking status: Never   Smokeless tobacco: Never  Vaping Use   Vaping Use:  Never used  Substance Use Topics   Alcohol use: Not Currently   Drug use: Never     Family History: The patient's family history includes Heart attack in his father.  ROS:   Please see the history of present illness.    (+) Lightheadedness (+) Shoulder pain (+) Chest Discomfort All other systems reviewed and are negative.  EKGs/Labs/Other Studies Reviewed:    The following studies were reviewed today:  Lexiscan Myoview 01/02/2020: Nuclear stress EF: 60%. The left ventricular ejection fraction is normal (55-65%). There was no ST segment deviation noted during stress. The study is normal. This is a low risk study.   Normal stress nuclear study with no ischemia or infarction.  Gated ejection fraction 60% with normal wall motion.  Echo 12/26/2019:  1. Left ventricular ejection fraction, by estimation, is 60 to 65%. The  left ventricle has normal function. The left ventricle has no regional  wall motion abnormalities. Left ventricular diastolic parameters were  normal.   2. Right ventricular systolic function is normal. The right ventricular  size is normal.   3. The mitral valve is grossly normal. Trivial mitral valve  regurgitation.   4. The aortic valve is tricuspid. Aortic valve regurgitation is not  visualized.   5. The inferior vena cava is normal in size with greater than 50%  respiratory variability, suggesting right atrial pressure of 3 mmHg.   Intraoperative TEE 9/56/3875: Complications: No known complications during this procedure.  POST-OP IMPRESSIONS  - Left Ventricle: The left ventricle is unchanged from pre-bypass.  - Aorta: The aorta appears unchanged from pre-bypass.  - Left Atrial Appendage: The left atrial appendage appears unchanged from  pre-bypass.  - Aortic Valve: The aortic valve appears unchanged from pre-bypass.  - Mitral Valve: There is trivial regurgitation.  - Tricuspid Valve: There is trivial regurgitation.  - Interatrial Septum: The  interatrial septum appears unchanged from  pre-bypass.  - Pericardium: The pericardium appears unchanged from pre-bypass.  US Doppler Pre-CABG 10/17/2019: Right Carotid: Velocities in the right ICA are consistent with a 40-59%                 stenosis.   Left Carotid: Velocities in the left ICA are consistent with a 1-39%  stenosis.   Vertebrals:  Bilateral vertebral arteries demonstrate antegrade flow.  Subclavians: Normal flow hemodynamics were seen in bilateral subclavian arteries.   Bilateral ABI: Bilateral ankle-brachial indexes are within normal range.  No evidence of significant lower extremity arterial disease.  Bilateral Extremity: Doppler waveforms remain within normal limits with  compression bilaterally for the radial arteries. Doppler waveforms remain  within normal limits with compression bilaterally for the ulnar arteries.   LHC 10/14/2019: Dist RCA lesion is 90% stenosed. Prox Cx to Mid Cx lesion is 80% stenosed. Mid LAD lesion is 75%  stenosed. The left ventricular systolic function is normal. LV end diastolic pressure is normal. The left ventricular ejection fraction is 55-65% by visual estimate.  ETT 10/08/2019: Blood pressure demonstrated a normal response to exercise. Downsloping ST segment depression ST segment depression of 2 mm was noted during stress in the II, III and aVF leads, beginning at 4 minutes of stress, and returning to baseline after 1-5 minutes of recovery. High risk stress test based on Duke Treadmill Score. Positive stress test.   EKG:   01/17/2022 EKG: Rate 54. Sinus bradycardia and sinus arrhythmia.  01/15/2021: NSR, PAC's, rate 64 bpm  I have independently reviewed the images from stress nuclear study 01/02/20.  Recent Labs: No results found for requested labs within last 365 days.  Recent Lipid Panel    Component Value Date/Time   CHOL 162 12/23/2019 0000   TRIG 90 12/23/2019 0000   HDL 44 12/23/2019 0000   CHOLHDL 3.7 12/23/2019 0000    LDLCALC 101 (H) 12/23/2019 0000    Physical Exam:    VS:  BP (!) 158/82   Pulse (!) 54   Ht '5\' 5"'$  (1.651 m)   Wt 180 lb 12.8 oz (82 kg)   SpO2 99%   BMI 30.09 kg/m     Wt Readings from Last 5 Encounters:  01/17/22 180 lb 12.8 oz (82 kg)  01/15/21 183 lb (83 kg)  07/15/20 181 lb 6.4 oz (82.3 kg)  01/23/20 175 lb 9.6 oz (79.7 kg)  01/02/20 174 lb (78.9 kg)    Constitutional: No acute distress Eyes: sclera non-icteric, normal conjunctiva and lids ENMT: normal dentition, moist mucous membranes Cardiovascular: regular rhythm, normal rate, no murmurs. S1 and S2 normal. No jugular venous distention.  Respiratory: clear to auscultation bilaterally GI : normal bowel sounds, soft and nontender. No distention.   MSK: extremities warm, well perfused. No edema.  NEURO: grossly nonfocal exam, moves all extremities. PSYCH: alert and oriented x 3, normal mood and affect.   ASSESSMENT:    1. CAD in native artery   2. S/P CABG x 3   3. Chest pain of uncertain etiology   4. Essential hypertension   5. Hyperlipidemia, unspecified hyperlipidemia type   6. Prediabetes   7. Medication management     PLAN:    S/P CABG x 3 - Plan: EKG 12-Lead CAD in native artery Chest pain of uncertain etiology - seems primarily post-incisional pain now or MSK. Recommend prn nitro, prn H2 blocker or topical therapy, whatever seems to work best for pain.  - low risk nuc with similar pain in 2021. - continue ASA 81 mg daily - continue BB, statin. He did not feel well when he ran out of carvedilol, continue and refilled today.   Essential hypertension Med management - Elevated today, will increase amlodipine to 10 mg daily and fill Rx today.  - HCTZ appears to have been stopped for renal dysfunction, he may need additional antiHTN therapy however he notes some orthostasis. Primarily being managed by PCP, happy to assist as needed.  Hyperlipidemia, unspecified hyperlipidemia type - on lipitor 80 mg  daily and zetia 10 mg daily, continue. LDL 81, near target of <70.  Follow-up in 1 year.  Total time of encounter: 30 minutes total time of encounter, including 20 minutes spent in face-to-face patient care on the date of this encounter. This time includes coordination of care and counseling regarding above mentioned problem list. Remainder of non-face-to-face time involved reviewing chart documents/testing relevant to the patient  encounter and documentation in the medical record. I have independently reviewed documentation from referring provider.   Cherlynn Kaiser, MD, [facility]    Medication Adjustments/Labs and Tests Ordered: Current medicines are reviewed at length with the patient today.  Concerns regarding medicines are outlined above.   Orders Placed This Encounter  Procedures   EKG 12-Lead     Meds ordered this encounter  Medications   carvedilol (COREG) 6.25 MG tablet    Sig: Take 1 tablet (6.25 mg total) by mouth 2 (two) times daily.    Dispense:  180 tablet    Refill:  3   amLODipine (NORVASC) 10 MG tablet    Sig: Take 1 tablet (10 mg total) by mouth daily.    Dispense:  90 tablet    Refill:  3     Patient Instructions  Medication Instructions:  CARVEDILOL REFILLED   PLEASE INCREASE AMLODIPINE TO '10mg'$  ONCE DAILY  YOU CAN TAKE 2 OF YOUR '5mg'$  TABLETS UNTIL YOU RUN OUT- THEN PICK UP '10mg'$  TABLETS FROM THE PHARMACY   *If you need a refill on your cardiac medications before your next appointment, please call your pharmacy*  Follow-Up: At [facility], you and your health needs are our priority.  As part of our continuing mission to provide you with exceptional heart care, we have created designated Provider Care Teams.  These Care Teams include your primary Cardiologist (physician) and Advanced Practice Providers (APPs -  Physician Assistants and Nurse Practitioners) who all work together to provide you with the care you need, when you need  it.  Your next appointment:   1 year(s)  The format for your next appointment:   In Person  Provider:   Elouise Munroe, MD            Rondell Reams as a scribe for Elouise Munroe, MD.,have documented all relevant documentation on the behalf of Elouise Munroe, MD,as directed by  Elouise Munroe, MD while in the presence of Elouise Munroe, MD.   I, Elouise Munroe, MD, have reviewed all documentation for the visit on 01/17/2022. The documentation on today's date of service for the exam, diagnosis, procedures, and orders are all accurate and complete.

## 2022-02-15 DIAGNOSIS — N3946 Mixed incontinence: Secondary | ICD-10-CM | POA: Diagnosis not present

## 2022-02-15 DIAGNOSIS — R350 Frequency of micturition: Secondary | ICD-10-CM | POA: Diagnosis not present

## 2022-02-22 IMAGING — CR DG CHEST 2V
2 series · 2 of 2 positions shown · non-contrast
Comparison: 10/23/2019.

CLINICAL DATA: History of CABG.  Shortness of breath.

EXAM:
CHEST - 2 VIEW

[chest pa]
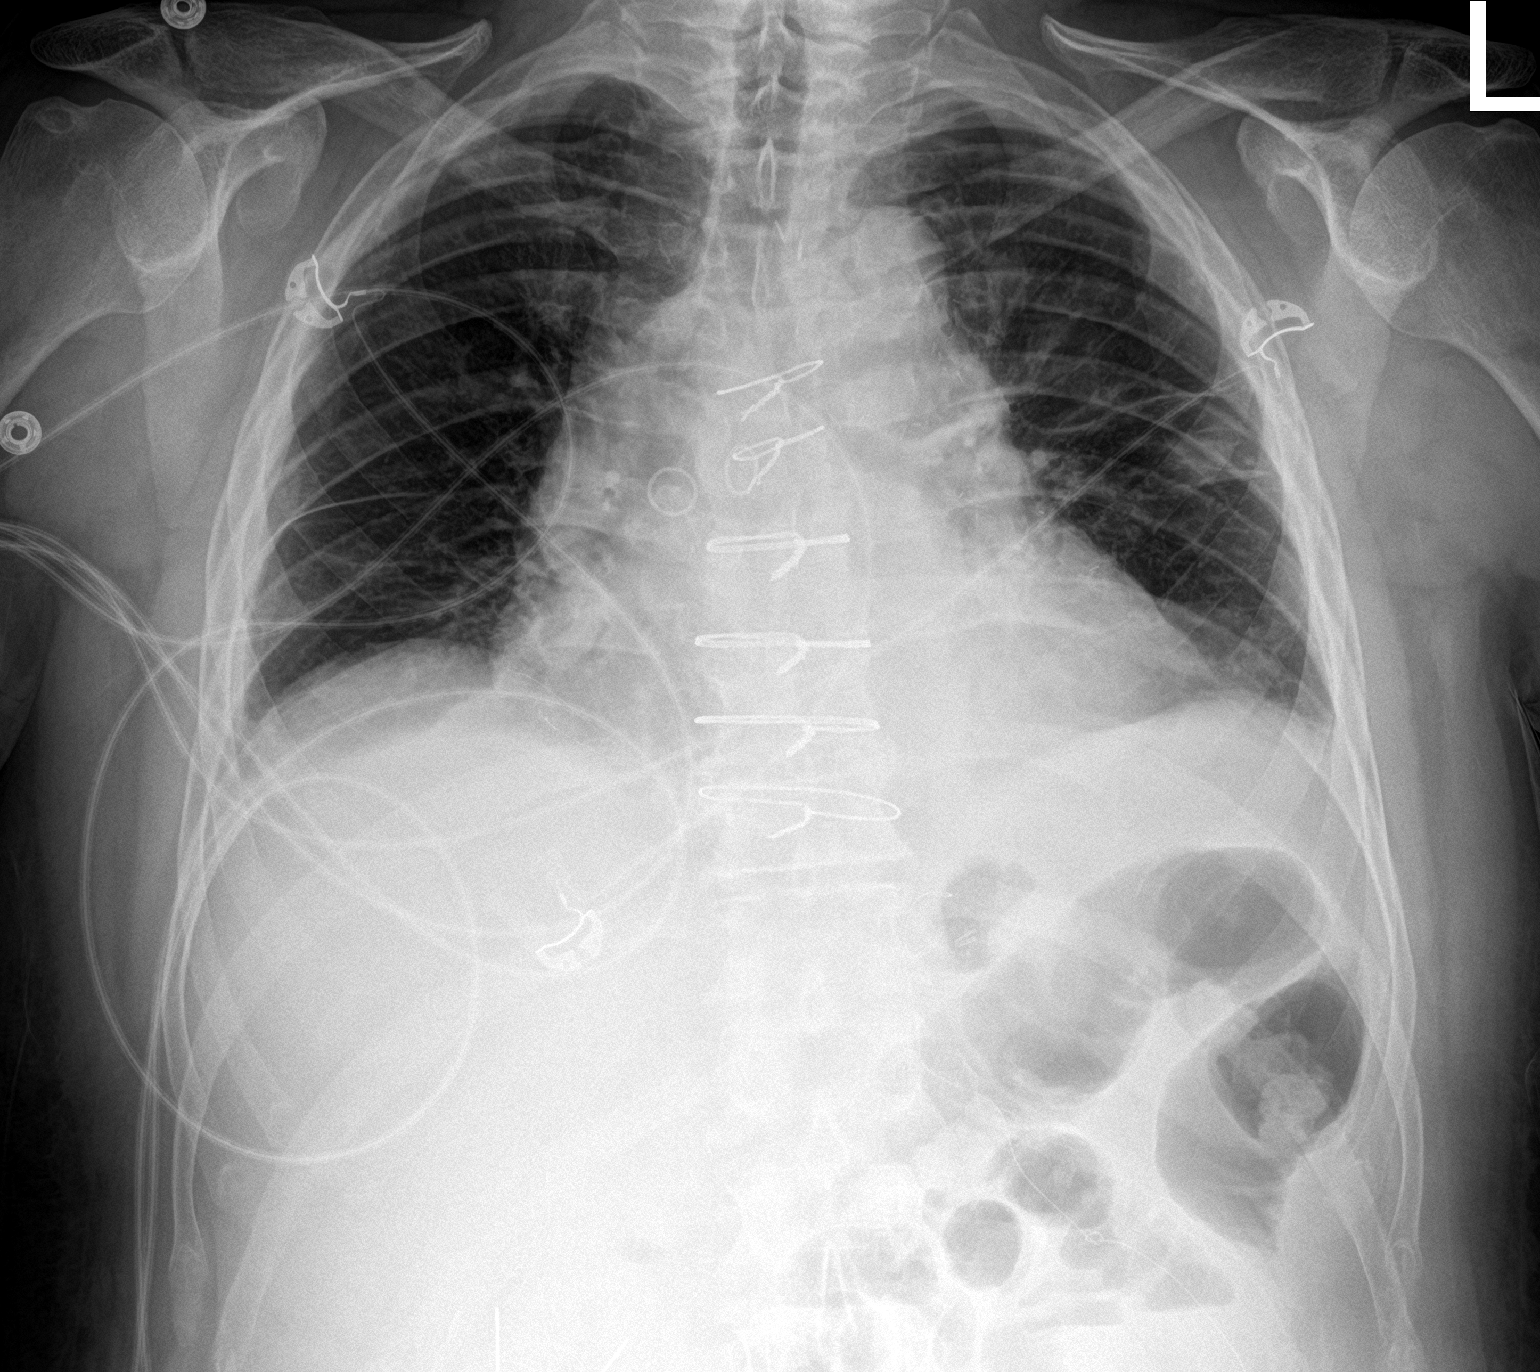

[chest lat]
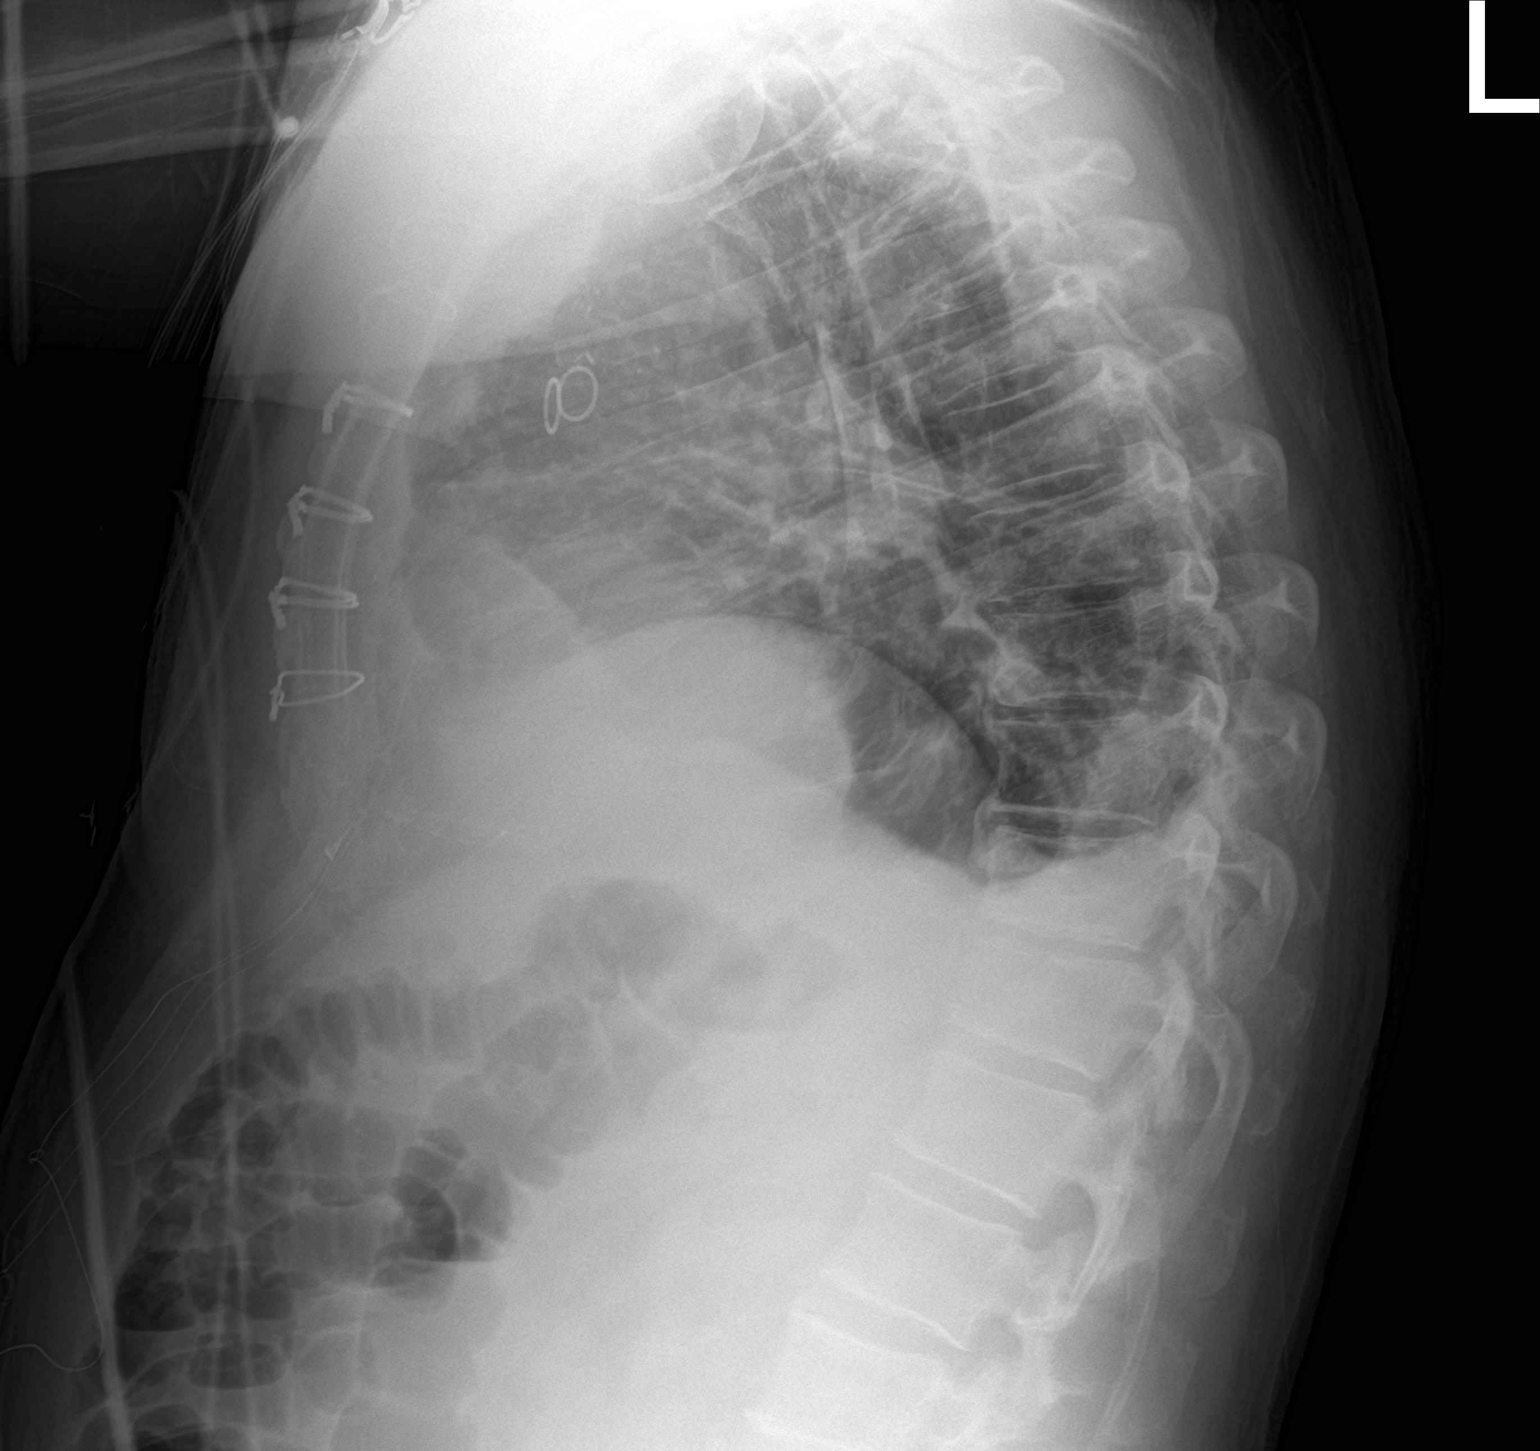

[2 of 2 positions shown; findings below may reference images not displayed]

FINDINGS: Interim removal of right IJ sheath. Trachea is not deviated on
today's exam. Prior CABG. Stable cardiomegaly. Stable bilateral
subsegmental atelectasis. Tiny bilateral pleural effusions cannot be
excluded. No pneumothorax.
IMPRESSION: 1.  Interim removal of right IJ sheath.  No pneumothorax identified.

2.  Prior CABG.  Stable cardiomegaly.

3. Stable bilateral subsegmental atelectasis. Tiny bilateral pleural
effusions cannot be excluded. No evidence of tracheal deviation
noted on today's exam.

## 2022-02-23 IMAGING — DX DG CHEST 2V
2 series · 2 of 2 positions shown · non-contrast
Comparison: October 24, 2019

CLINICAL DATA: Shortness of breath

EXAM:
CHEST - 2 VIEW

[chest pa]
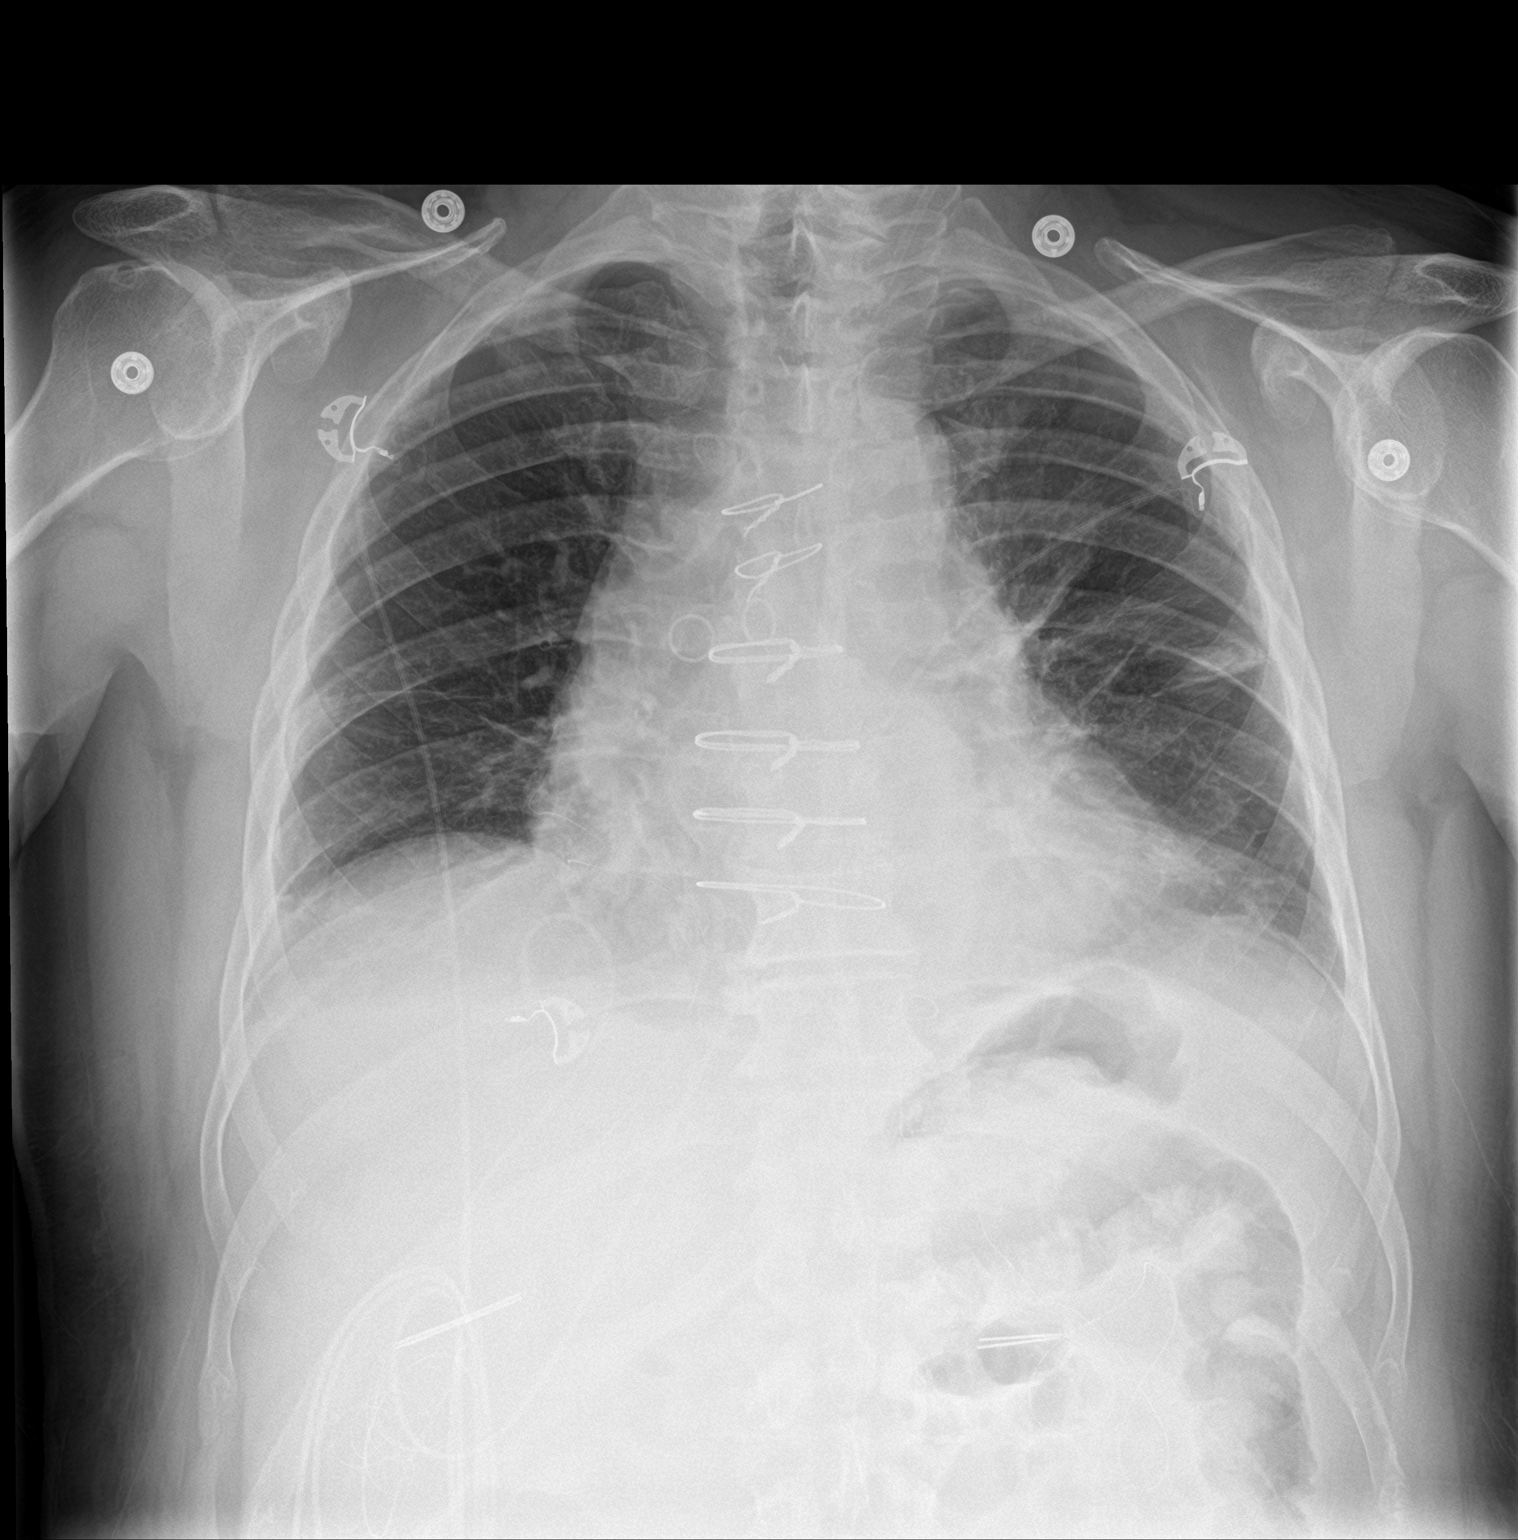

[chest lat]
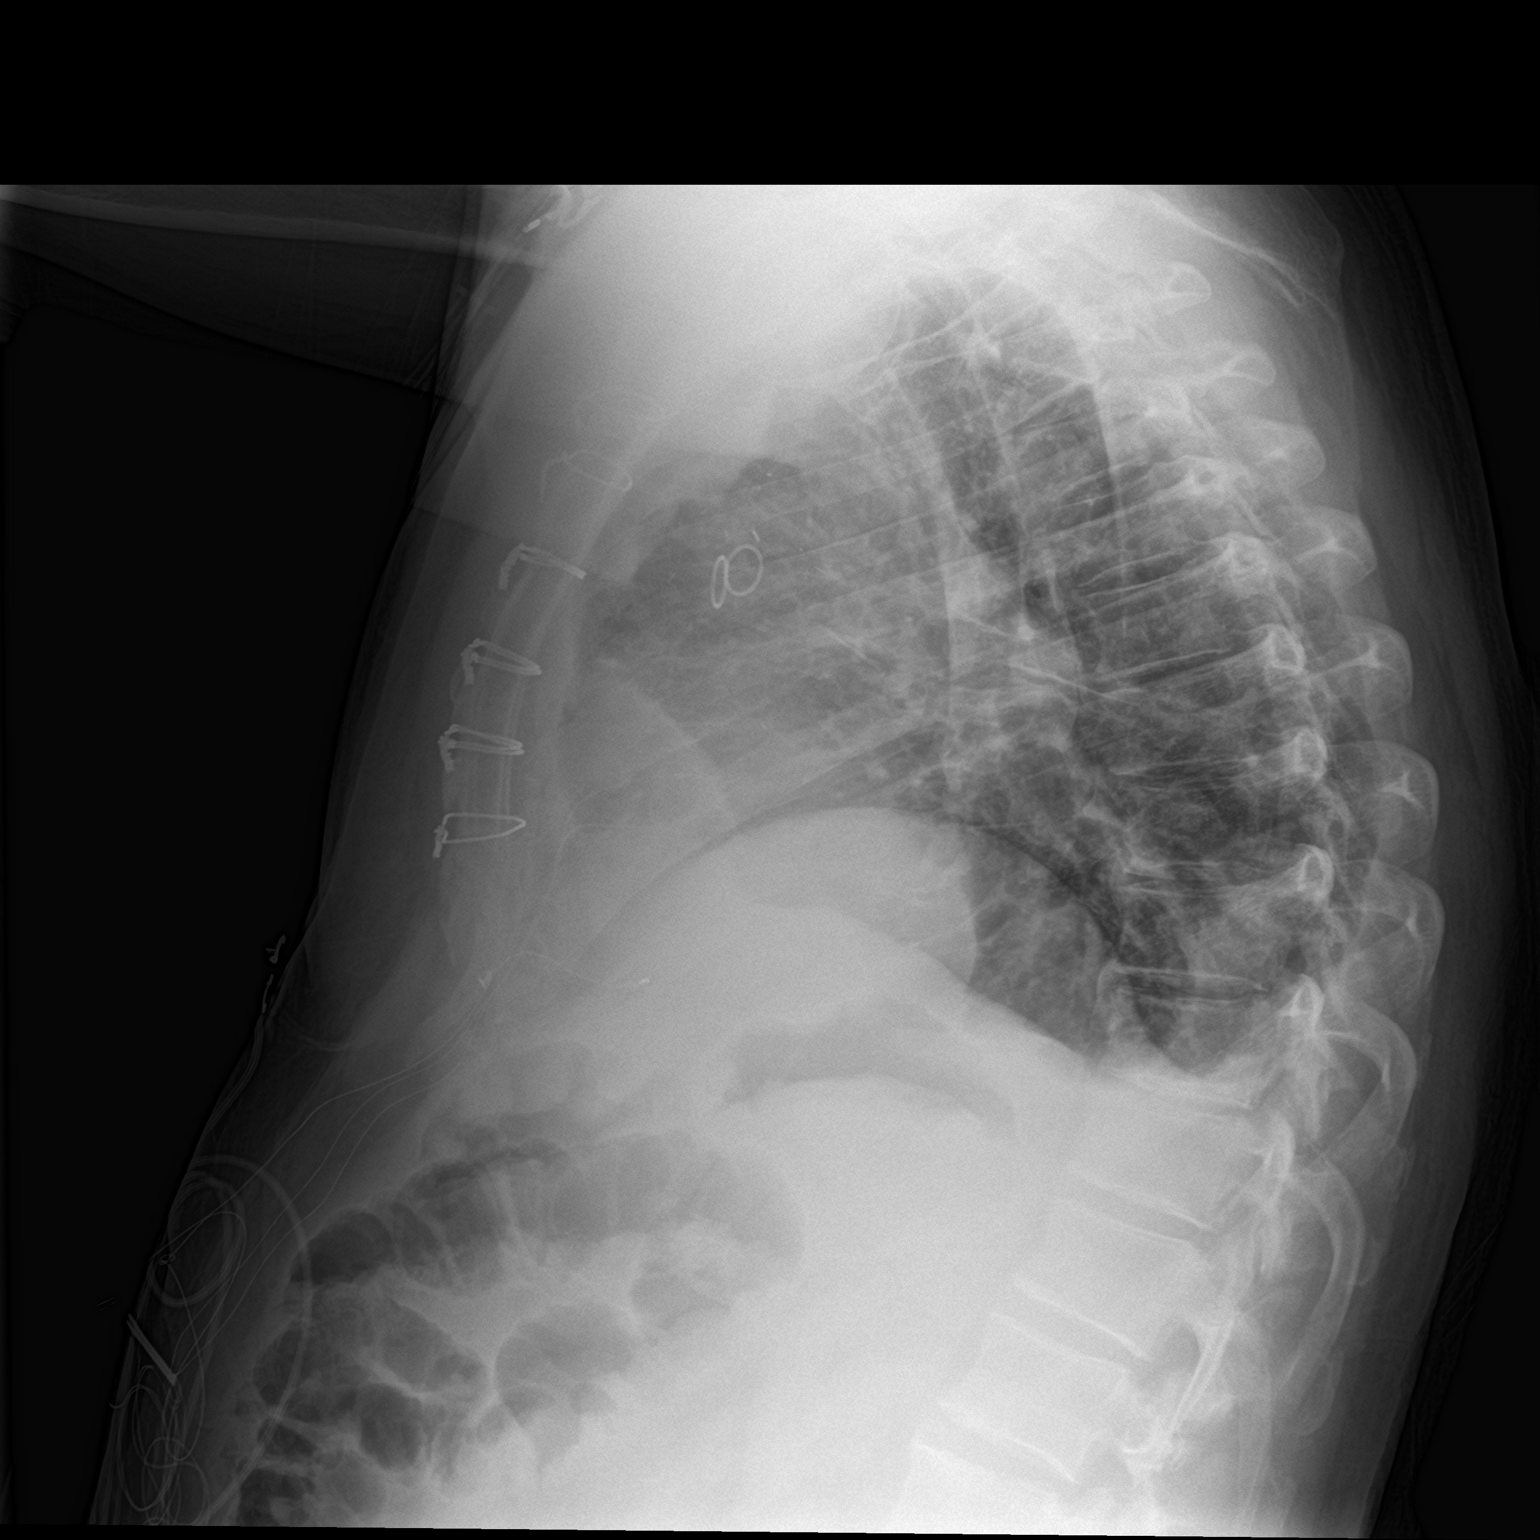

[2 of 2 positions shown; findings below may reference images not displayed]

FINDINGS: Temporary pacemaker wires are attached to the right heart. There is
a small left pleural effusion with mild atelectasis in the left mid
lung. There is minimal atelectasis in the lateral right base. No
consolidation noted. Heart is borderline enlarged with pulmonary
vascularity normal. No adenopathy. Status post coronary artery
bypass grafting. No pneumothorax. No bone lesions.
IMPRESSION: No pneumothorax. Small left pleural effusion. Mild left base
atelectasis and minimal lateral right base atelectasis. No
consolidation. Mild cardiac prominence. Postoperative changes.

## 2022-03-20 IMAGING — DX DG CHEST 2V
2 series · 2 of 2 positions shown · non-contrast
Comparison: 10/25/2019

CLINICAL DATA: Status post CABG

EXAM:
CHEST - 2 VIEW

[dg chest 2 view (1 of 2)]
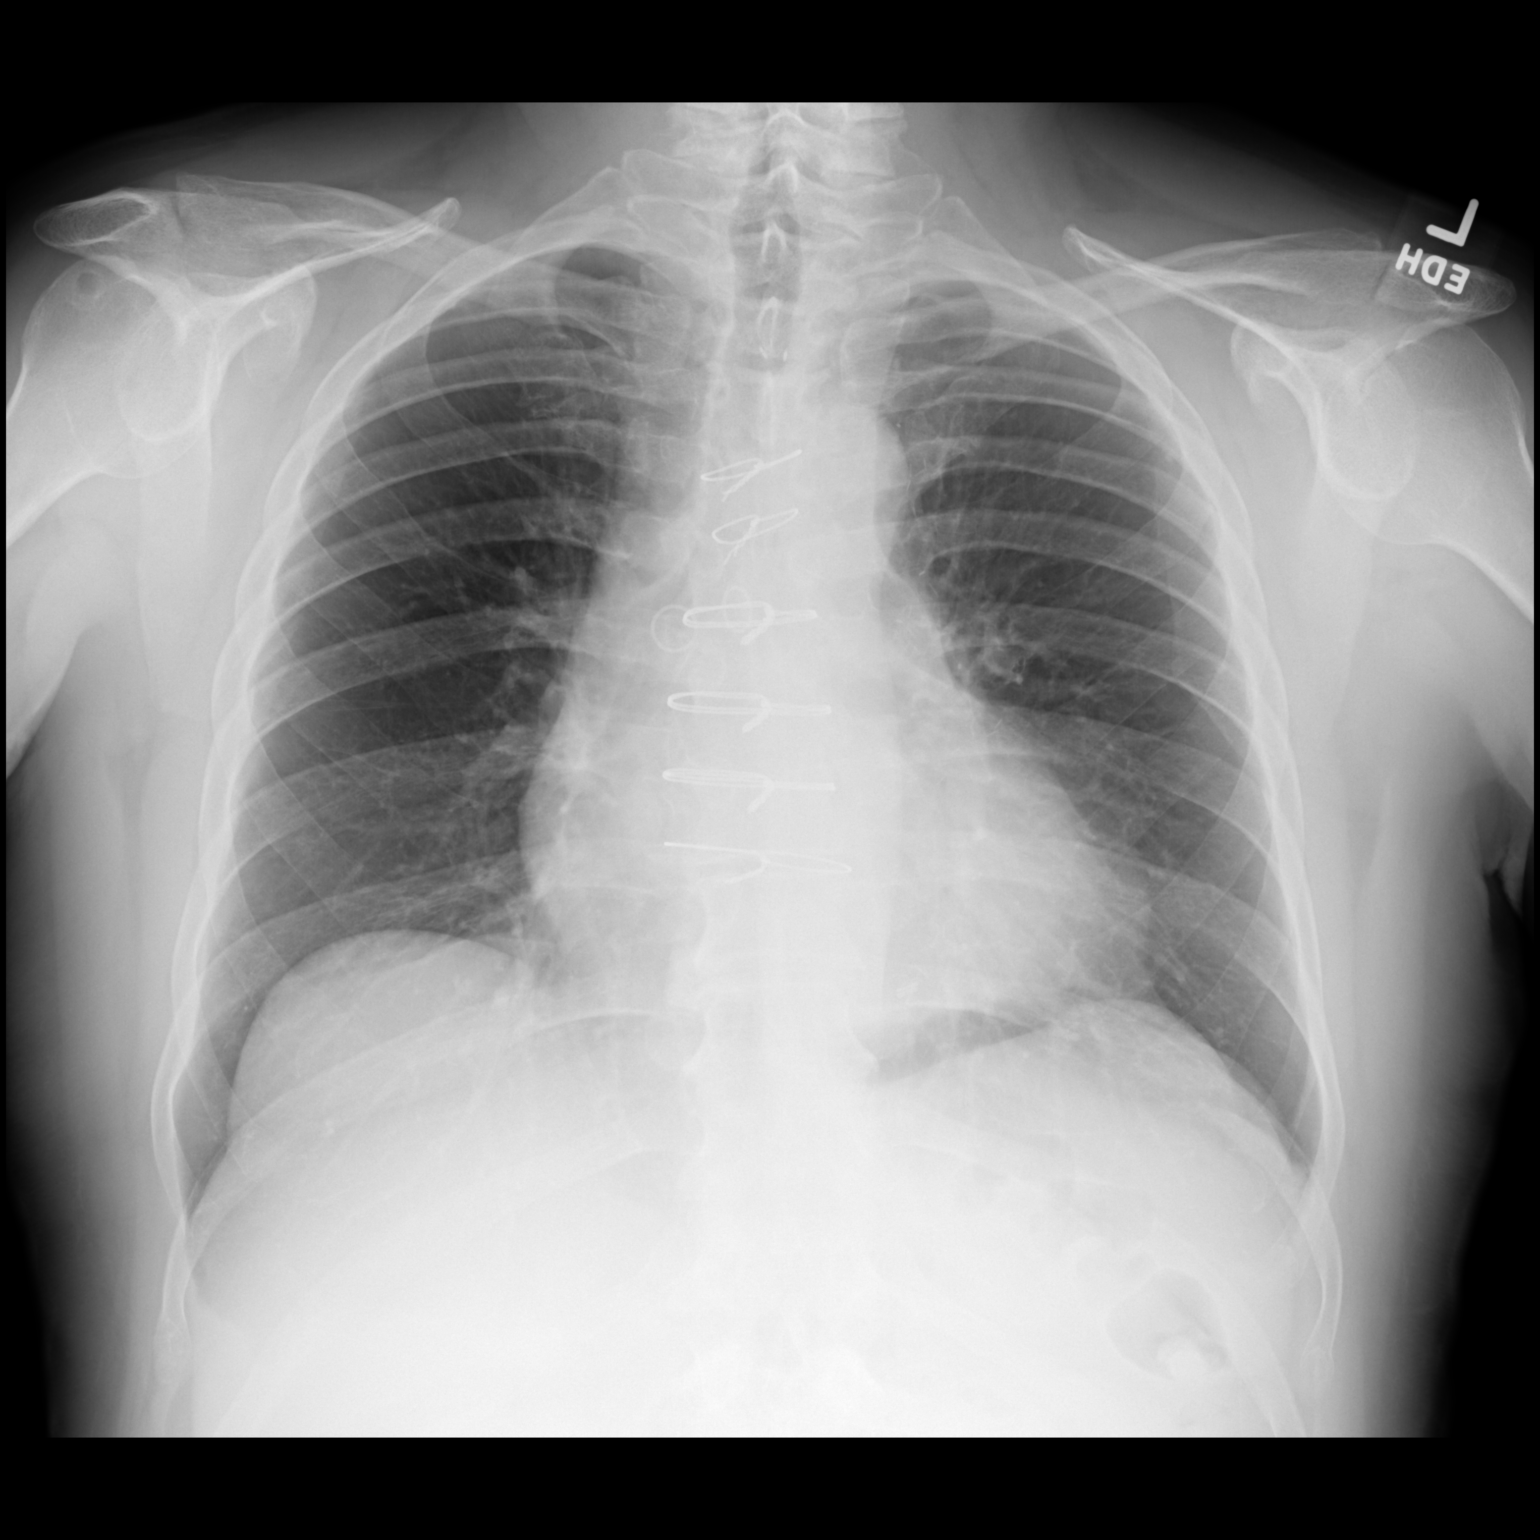

[dg chest 2 view (2 of 2)]
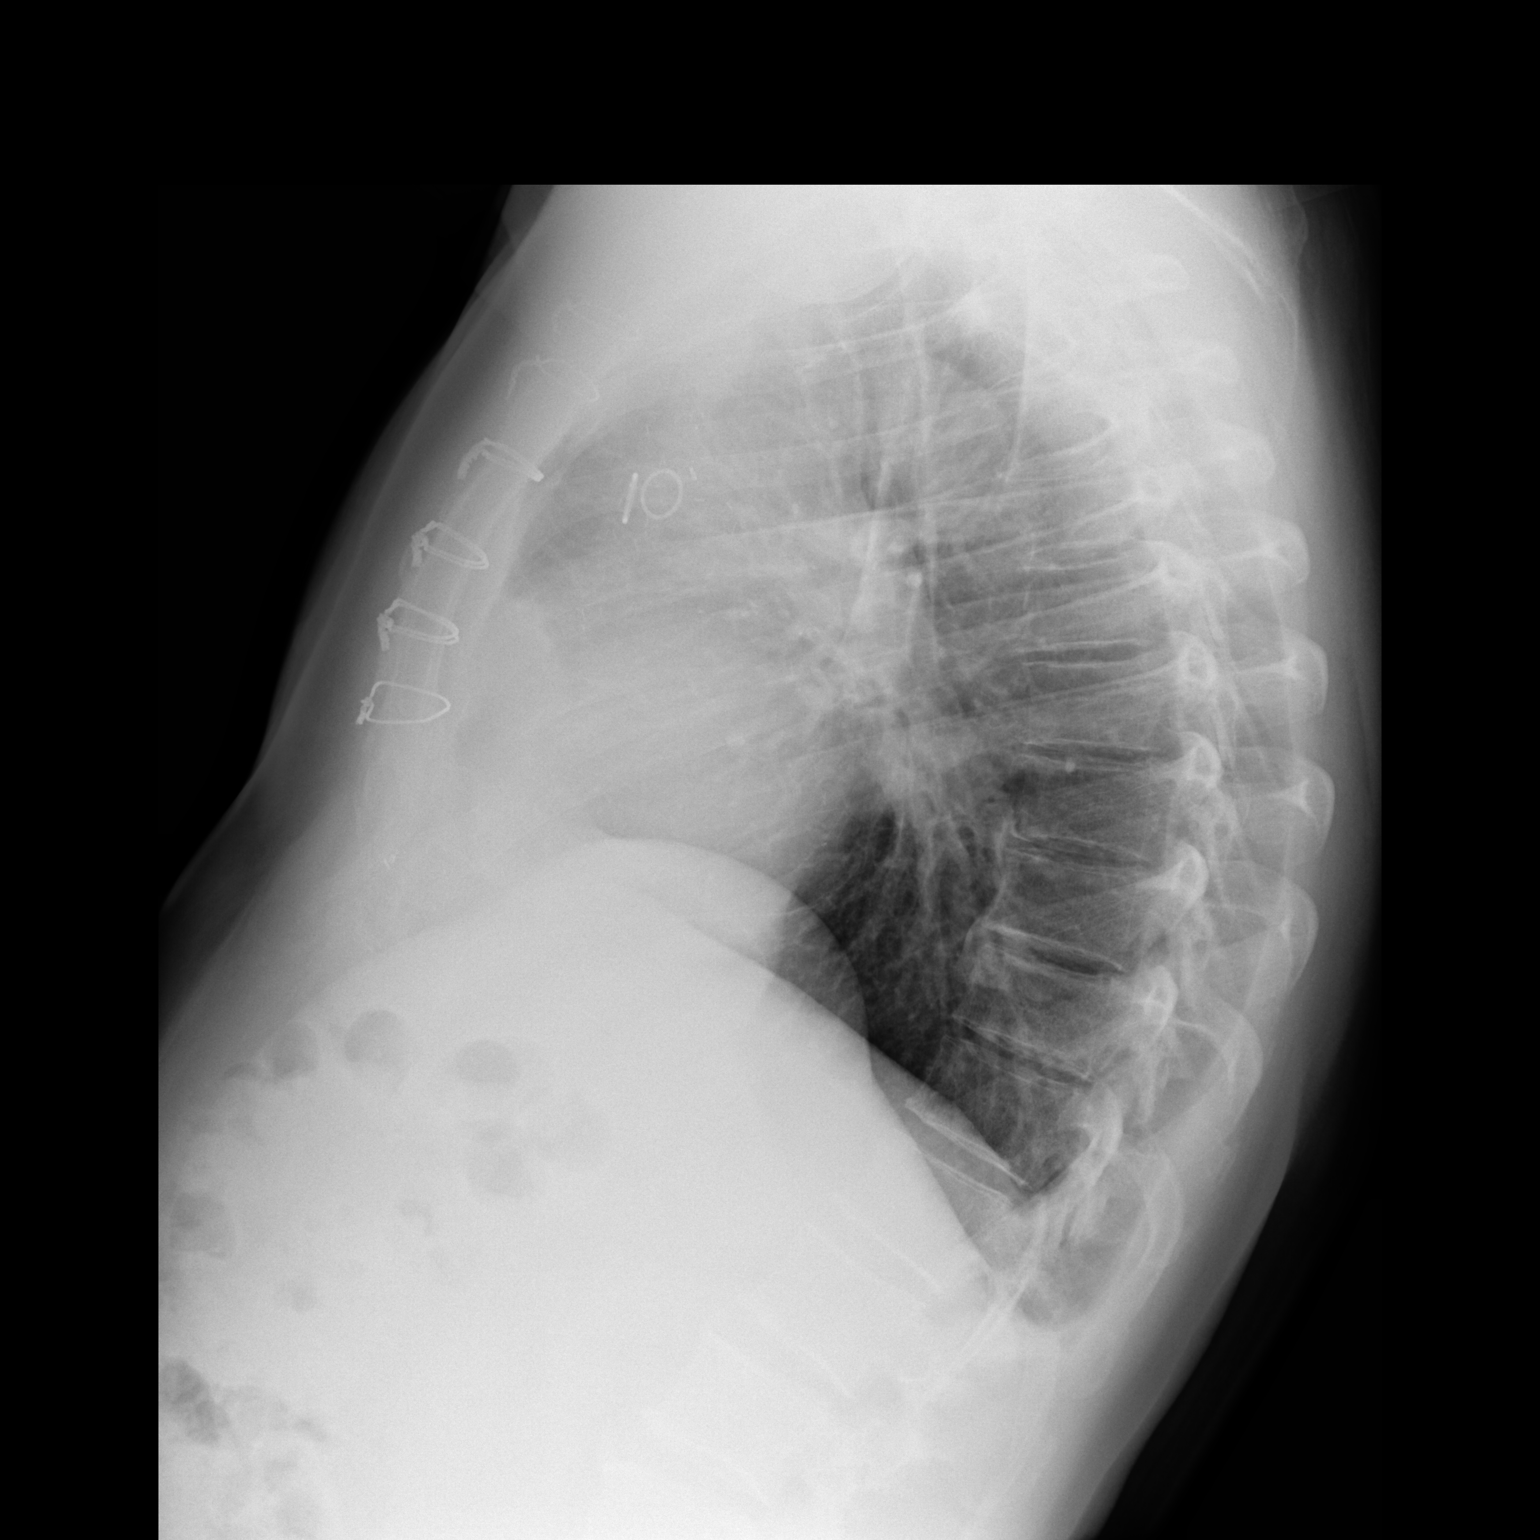

[2 of 2 positions shown; findings below may reference images not displayed]

FINDINGS: The heart size and mediastinal contours are within normal limits.
Prior CABG. Both lungs are clear. The visualized skeletal structures
are unremarkable.
IMPRESSION: No active cardiopulmonary disease.

## 2022-04-06 DIAGNOSIS — I10 Essential (primary) hypertension: Secondary | ICD-10-CM | POA: Diagnosis not present

## 2022-05-18 DIAGNOSIS — N3946 Mixed incontinence: Secondary | ICD-10-CM | POA: Diagnosis not present

## 2022-06-17 ENCOUNTER — Other Ambulatory Visit: Payer: Self-pay

## 2022-06-17 MED ORDER — LISINOPRIL 40 MG PO TABS: 40.0000 mg | ORAL_TABLET | Freq: Every day | ORAL | 1 refills | Status: DC

## 2022-06-30 ENCOUNTER — Ambulatory Visit (INDEPENDENT_AMBULATORY_CARE_PROVIDER_SITE_OTHER): Payer: PPO | Admitting: Internal Medicine

## 2022-06-30 ENCOUNTER — Encounter: Payer: Self-pay | Admitting: Internal Medicine

## 2022-06-30 VITALS — BP 130/72 | HR 52 | Temp 97.4°F | Resp 16 | Ht 65.0 in | Wt 179.1 lb

## 2022-06-30 DIAGNOSIS — G4700 Insomnia, unspecified: Secondary | ICD-10-CM

## 2022-06-30 DIAGNOSIS — Z23 Encounter for immunization: Secondary | ICD-10-CM

## 2022-06-30 DIAGNOSIS — N1831 Chronic kidney disease, stage 3a: Secondary | ICD-10-CM | POA: Diagnosis not present

## 2022-06-30 DIAGNOSIS — I10 Essential (primary) hypertension: Secondary | ICD-10-CM

## 2022-06-30 DIAGNOSIS — F411 Generalized anxiety disorder: Secondary | ICD-10-CM | POA: Diagnosis not present

## 2022-06-30 DIAGNOSIS — H6121 Impacted cerumen, right ear: Secondary | ICD-10-CM | POA: Insufficient documentation

## 2022-06-30 HISTORY — DX: Insomnia, unspecified: G47.00

## 2022-06-30 MED ORDER — ALPRAZOLAM 0.5 MG PO TABS: 0.5000 mg | ORAL_TABLET | Freq: Every day | ORAL | 2 refills | Status: DC

## 2022-06-30 NOTE — Assessment & Plan Note (Signed)
He is to avoid nephrotoxins as able.  We will see next year whether we can put him on farxiga or even jardiance.

## 2022-06-30 NOTE — Addendum Note (Signed)
Addended by: Townsend Roger on: 06/30/2022 10:31 AM   Modules accepted: Orders, Level of Service

## 2022-06-30 NOTE — Assessment & Plan Note (Signed)
He has insomnia as well but there is no change.  We will refill his xanax today.

## 2022-06-30 NOTE — Addendum Note (Signed)
Addended byLucile Shutters on: 06/30/2022 10:38 AM   Modules accepted: Orders

## 2022-06-30 NOTE — Assessment & Plan Note (Signed)
We did ear irrigation today with good effect.

## 2022-06-30 NOTE — Assessment & Plan Note (Signed)
His BP is doing well.  He has some orthostasis first thing in the morning at times.  I asked him to go slow when initially getting up.

## 2022-06-30 NOTE — Progress Notes (Addendum)
Office Visit  Subjective   Patient ID: Rodney Young   DOB: 11-Jul-1956   Age: 66 y.o.   MRN: [id number]   Chief Complaint Chief Complaint  Patient presents with   Follow-up    Hypertension     History of Present Illness The patient is a 66 year old Caucasian/White male who presents for a follow-up evaluation of hypertension.  On his last visit, he did complain of some orthostasis where he states he would have some "swimmy headedness" especially first thing in the morning after he would take his pills.  He was taking his amlodipine '10mg'$  in the morning and I asked him to switch it to the evening and since then he has not noticed any change in this sensation of getting up.  Once he gets up, he has no other problems during th day.  He did see his cardiologist in 12/2021 where they noted his BP was elevated at that time and they increased his amlodipine from '5mg'$  to '10mg'$  daily.  Soon after he states he will sometimes have some orthostasis after taking his morning pills.   This past year, he was having some dizziness and I discontinued his HCTZ component of his lisinopril-HCT. This did improve his dizziness over the interim.  The patient has been checking his blood pressure at home. The patient's blood pressure has ranged systolically in the 009-381'W. The patient's current medications include: lisinopril '40mg'$  daily, amlodipine '10mg'$  qhs and carvedilol 6.'25mg'$  BID.  The patient has been tolerating his medications well. The patient denies any headache, shortness of breath, weakness/numbness, and edema. He does have a history of white coat syndrome.  He also has a history of Stage IIIa CKD which I believe is due to his history of HTN.  His creatinine over the last 3-4 years has been running from 1.3-1.4 with GFR of 50-60.  He denies use of NSAIDS.  On his last visit, we did start him on farxiga but he states he had side effects of dizziness when taking it and he stopped it.  He was not having increased  urinations with the medication.  He also states he cannot afford it as well.    The patient also has a history of anxiety disorder. Over the interim, there has been no change in his anxiety. He was diagnosed with anxiety disorder in the 1990's. He is currently on Xanax 0.'5mg'$  where he takes 1/2 pill BID. I have discussed using a SSRI with him in the past but he tells me he has been on xanax for the last 5-6 years and this controls him. He has never been placed on a controller medicine per his account. The symptoms have been present for years and are described as mild in severity and is stable. He reports no additional symptoms except insomnia. She denies difficulty concentrating, difficulty performing routine daily activities, fatigue, extreme feelings of guilt, feelings of isolation, feelings of worthlessness, helpless feeling, suicidal ideation, homicidal ideation, weight gain, weight loss, insomnia, loss of appetite, social withdrawal, and loss of interest in pleasurable activities. This patient feels that he is able to care for himself. She currently lives with his girlfriend. She has no significant prior history of mental health disorders.         Past Medical History Past Medical History:  Diagnosis Date   Anxiety    Cancer [facility])    prostate   Coronary artery disease    GERD (gastroesophageal reflux disease)    Headache  Hyperlipidemia    Hypertension      Allergies Allergies  Allergen Reactions   Imdur [Isosorbide Nitrate] Other (See Comments)    " headache"     Review of Systems Review of Systems  Constitutional:  Negative for chills and fever.  HENT:  Positive for ear pain. Negative for congestion, hearing loss, sore throat and tinnitus.   Respiratory:  Negative for cough and shortness of breath.   Cardiovascular:  Negative for chest pain, palpitations and leg swelling.  Gastrointestinal:  Negative for constipation, diarrhea, nausea and vomiting.  Musculoskeletal:   Negative for myalgias.  Skin:  Negative for itching and rash.  Neurological:  Negative for dizziness, weakness and headaches.       Objective:    Vitals BP 130/72 (BP Location: Left Arm, Patient Position: Sitting, Cuff Size: Normal)   Pulse (!) 52   Temp (!) 97.4 F (36.3 C) (Temporal)   Resp 16   Ht '5\' 5"'$  (1.651 m)   Wt 179 lb 0.8 oz (81.2 kg)   SpO2 98%   BMI 29.80 kg/m    Physical Examination Physical Exam Constitutional:      Appearance: Normal appearance. He is not ill-appearing.  HENT:     Right Ear: External ear normal. There is impacted cerumen.     Left Ear: Tympanic membrane, ear canal and external ear normal.     Mouth/Throat:     Mouth: Mucous membranes are moist.     Pharynx: Oropharynx is clear. No oropharyngeal exudate or posterior oropharyngeal erythema.  Cardiovascular:     Rate and Rhythm: Normal rate and regular rhythm.     Pulses: Normal pulses.     Heart sounds: No murmur heard.    No friction rub. No gallop.  Pulmonary:     Effort: Pulmonary effort is normal. No respiratory distress.     Breath sounds: Normal breath sounds. No wheezing, rhonchi or rales.  Abdominal:     General: Abdomen is flat. Bowel sounds are normal. There is no distension.     Palpations: Abdomen is soft.     Tenderness: There is no abdominal tenderness.  Musculoskeletal:     Right lower leg: No edema.     Left lower leg: No edema.  Skin:    General: Skin is warm and dry.     Findings: No rash.  Neurological:     Mental Status: He is alert.        Assessment & Plan:   Essential hypertension His BP is doing well.  He has some orthostasis first thing in the morning at times.  I asked him to go slow when initially getting up.  Impacted cerumen of right ear We did ear irrigation today with good effect.  CKD (chronic kidney disease) stage 3, GFR 30-59 ml/min He is to avoid nephrotoxins as able.  We will see next year whether we can put him on farxiga or even  jardiance.  GAD (generalized anxiety disorder) He has insomnia as well but there is no change.  We will refill his xanax today.    Return in about 3 months (around 09/29/2022) for annual.   Townsend Roger, MD

## 2022-08-11 ENCOUNTER — Other Ambulatory Visit: Payer: Self-pay

## 2022-08-11 MED ORDER — FAMOTIDINE 40 MG PO TABS: 40.0000 mg | ORAL_TABLET | Freq: Two times a day (BID) | ORAL | 1 refills | Status: DC

## 2022-08-11 MED ORDER — FAMOTIDINE 40 MG PO TABS: 40.0000 mg | ORAL_TABLET | Freq: Two times a day (BID) | ORAL | 0 refills | Status: DC

## 2022-08-24 DIAGNOSIS — N3946 Mixed incontinence: Secondary | ICD-10-CM | POA: Diagnosis not present

## 2022-08-24 DIAGNOSIS — N5231 Erectile dysfunction following radical prostatectomy: Secondary | ICD-10-CM | POA: Diagnosis not present

## 2022-09-30 ENCOUNTER — Encounter: Payer: Self-pay | Admitting: Internal Medicine

## 2022-09-30 ENCOUNTER — Ambulatory Visit: Payer: PPO | Admitting: Internal Medicine

## 2022-09-30 VITALS — BP 168/78 | HR 58 | Temp 97.3°F | Resp 16 | Ht 65.0 in | Wt 186.4 lb

## 2022-09-30 DIAGNOSIS — I10 Essential (primary) hypertension: Secondary | ICD-10-CM | POA: Diagnosis not present

## 2022-09-30 DIAGNOSIS — Z6831 Body mass index (BMI) 31.0-31.9, adult: Secondary | ICD-10-CM | POA: Insufficient documentation

## 2022-09-30 DIAGNOSIS — E782 Mixed hyperlipidemia: Secondary | ICD-10-CM | POA: Diagnosis not present

## 2022-09-30 DIAGNOSIS — N393 Stress incontinence (female) (male): Secondary | ICD-10-CM | POA: Insufficient documentation

## 2022-09-30 DIAGNOSIS — E669 Obesity, unspecified: Secondary | ICD-10-CM

## 2022-09-30 DIAGNOSIS — Z0000 Encounter for general adult medical examination without abnormal findings: Secondary | ICD-10-CM

## 2022-09-30 DIAGNOSIS — I25810 Atherosclerosis of coronary artery bypass graft(s) without angina pectoris: Secondary | ICD-10-CM

## 2022-09-30 DIAGNOSIS — N1831 Chronic kidney disease, stage 3a: Secondary | ICD-10-CM | POA: Diagnosis not present

## 2022-09-30 DIAGNOSIS — K219 Gastro-esophageal reflux disease without esophagitis: Secondary | ICD-10-CM

## 2022-09-30 DIAGNOSIS — F411 Generalized anxiety disorder: Secondary | ICD-10-CM | POA: Diagnosis not present

## 2022-09-30 DIAGNOSIS — Z8546 Personal history of malignant neoplasm of prostate: Secondary | ICD-10-CM

## 2022-09-30 HISTORY — DX: Body mass index (BMI) 31.0-31.9, adult: Z68.31

## 2022-09-30 HISTORY — DX: Gastro-esophageal reflux disease without esophagitis: K21.9

## 2022-09-30 MED ORDER — ATORVASTATIN CALCIUM 80 MG PO TABS: 80.0000 mg | ORAL_TABLET | ORAL | 3 refills | Status: DC

## 2022-09-30 NOTE — Assessment & Plan Note (Signed)
Plan as above.  

## 2022-09-30 NOTE — Assessment & Plan Note (Signed)
He had no protein in his urine last year despite having Stage III CKD.  We will repeat his urine studies.  He had side effects from farxiga in the past.  I want him to avoid NSAIDS.

## 2022-09-30 NOTE — Assessment & Plan Note (Signed)
Care as directed per Urology.  They have tried him on myrbetriq.

## 2022-09-30 NOTE — Assessment & Plan Note (Signed)
His anxiety is controlled on his xanax.

## 2022-09-30 NOTE — Assessment & Plan Note (Signed)
He has a history of CAD with CABG in 2021.  He is seen by cardiology yearly.  We will continue risk factor modfication.  He remains on an ASA.  He denies any angina today.

## 2022-09-30 NOTE — Assessment & Plan Note (Signed)
I want him to continue on his pepcid twice a day.  He states it controls his reflux.

## 2022-09-30 NOTE — Assessment & Plan Note (Signed)
His BP was elevated but has been controlled on previous visits since cardiology went up on his dose of amlodipine.  He denies any orthostasis today.  We will see what his BP is doing on his next visit.

## 2022-09-30 NOTE — Progress Notes (Signed)
Preventive Screening-Counseling & Management     Rodney Young is a 67 y.o. male who presents for Medicare Annual/Subsequent preventive examination.  Rodney Young is a 67 year old Caucasian/White male who presents for his annual wellness exam. This patient's past medical history Anxiety Disorder, Generalized, CAD, Chronic kidney disease, Stage III (moderate), GERD, Hypercholesterolemia, Hypertension, Stress Incontinence, Vitamin B12 Deficiency, and history of prostate cancer.   His last eye exam was in 11/2021 where he has bilateral cataracts they are following but otherwise his vision is doing well. His last colonoscopy was in 06/2020 where they found multiple polyps including adenmatous polyps. They want him to return in 3 years for a repeat colonoscopy. This colonoscopy was done due to iron defiency anemia. His previous colonoscopy was in 09/2017 and this showed multiple polyps. He also had an EGD done in 06/2020 which was normal but they did find H. pylori on biopsy where he was treated. The patient is followed by urology for a history of prostate cancer diagnosed in 2017 as well as stress incontinence. He underwent transurethral vaporization of his prostate in 12/2015 and subsequently underwent prostatectomy in 05/2016. His cancer is in remission. He last saw urology in 08/2022 for chronic stress incontinence and his history of prostate cancer. The patient gets up 1-2 times at night to urinate. He has underwent physical therapy for bladder training and he does feel like he empties his bladder. His last cystoscopy was in 11/2019.  The patient does exercise some where he works out in the yard. He does get yearly flu vaccines. He had his Prevnar 13 vaccine in 01/2020. He had pneumovax 23 in 04/07/2021 and he completed shingrix in 11/2020.   He is not interested in the RSV vaccine at this time.  The patient has had 3 COVID-19 vaccines including one booster. The patient denies any depression or memory  loss. He is on an ASA '81mg'$  daily.   The patient is a 67 year old Caucasian/White male who presents for a follow-up evaluation of hypertension.  He did see his cardiologist in 12/2021 where they noted his BP was elevated at that time and they increased his amlodipine from '5mg'$  to '10mg'$  daily. However, this past year he did complain of some orthostasis where he states he would have some "swimmy headedness" especially first thing in the morning after he would take his pills.  He was taking his amlodipine '10mg'$  in the morning and I asked him to switch it to the evening and since then he is not having any orthostasis.  He was having some dizziness where I also discontinued his HCTZ component of his lisinopril-HCT.  The patient has been checking his blood pressure at home. The patient's blood pressure has ranged systolically in the [id number]. The patient's current medications include: lisinopril '40mg'$  daily, amlodipine '10mg'$  qhs and carvedilol 6.'25mg'$  BID.  The patient has been tolerating his medications well. The patient denies any headache, shortness of breath, weakness/numbness, and edema. He does have a history of white coat syndrome.   He also has a history of Stage IIIa CKD which I believe is due to his history of HTN.  His creatinine over the last 3-4 years has been running from 1.3-1.4 with GFR of 50-60.  He denies use of NSAIDS.  On his last visit, we did start him on farxiga but he states he had side effects of dizziness when taking it and he stopped it.  He was not having increased urinations with the medication.  He also states he cannot afford it as well.    The patient also has a history of anxiety disorder. Over the interim, there has been no change in his anxiety. He was diagnosed with anxiety disorder in the 1990's. He is currently on Xanax 0.'5mg'$  where he takes 1/2 pill BID. I have discussed using a SSRI with him in the past but he tells me he has been on xanax for the last 5-6 years and this controls him.  He has never been placed on a controller medicine per his account. The symptoms have been present for years and are described as mild in severity and is stable. He reports no additional symptoms except insomnia. He denies difficulty concentrating, difficulty performing routine daily activities, fatigue, extreme feelings of guilt, feelings of isolation, feelings of worthlessness, helpless feeling, suicidal ideation, homicidal ideation, weight gain, weight loss, insomnia, loss of appetite, social withdrawal, and loss of interest in pleasurable activities. This patient feels that he is able to care for himself. She currently lives with his girlfriend. She has no significant prior history of mental health disorders.   Rodney Young returns today for routine followup on his cholesterol.  His cholesterol was elevated on his yearly exam from 10/2018 where I initially started him on pravastatin.  He began having myalgias with pravastatin so we switched him to Lipitor '20mg'$  which he was taking every other day.  We then changed his Lipitor to crestor in the setting of CAD.  He was on crestor but his cholesterol was not controlled.  We changed him to Lipitor '80mg'$  daily and added zetia '10mg'$  daily.  He was also having myalgias with crestor.  He is having some myalgias in his upper arms from lipitor.   He specifically denies abdominal pain, nausea, vomiting, diarrhea, and fatigue. He remains on dietary management as well as the following cholesterol lowering medications Lipitor '80mg'$  qhs and zetia '10mg'$  daily.  He is fasting in anticipation for labs today.    This patient has moderately severe CAD since 09/2019 and presents today for a status visit.  There has been no change since his last visit and he denies any angina. The patient saw me in 08/2019 and was reporting some intermittent chest pain and left shoulder pain when he exerted himself. I sent him to cardiology where they did a heart catherization on 10/14/19 which  showed a 90% distal RCA lesion, 80% proximal to mid left Cfx lesion, 75% mid LAD lesion and his LV gram showed an EF of 55-65%. He underwent a CABG x3 on [id number] without complication. See PMH for summary of cardiac history and status of revascularization. His CAD is controlled with therapy as summarized in the medication list and previous notes. The patient has no comorbid conditions. He has no baseline symptoms of CAD. He has the following modifiable risk factor(s): HTN, hyperlipidemia, sedentary lifestyle, and obesity. Specifically denied complaint(s):  palpitations, orthopnea, edema, exertional dyspnea, and syncope. He did see his cardiologist in 07/2020 where he was having some feelings of chest pressure that felt like "a ton of bricks". He had a previous myoview in 12/2019 which was normal and she was not sure if this was angina.  He previously did not not tolerate Imdur in the past.  She started him  on NTG but he states he never takes it as he thinks the chest pain has been occurring since he had his COVID-19 vaccine.  The patient uses a riding Conservation officer, nature and uses a weed  eater once a week and he states this does not trigger his chest pain/angina.  His last visit with cardiology was in 12/2021.  Today, he denies any exertional chest pain or other problems.           Rodney Young also has a history of iron defiency anemia.  We noted on his routine labs in 2021 that he was anemic and iron studies demonstrated iron defiency anemia.  He had a negative FIT test in 03/2020.  He was referred to GI wherere they performed EGD on 06/15/2020.  This EGD report states things looked normal but they did obtain biopsies and he was discovered to have H. Pylori infection.  He states he was given 3 antibiotics at that time.  He was having indigestion and burping with chest tightness and pain where I did not think it was angina and gave him a trial of omeprazole twice a day and placed him on Gas-X.  He states that it helped the  indigestion but it did not help his chest pain (see below).  He was taking omeprazole '40mg'$  daily but is now on pepcid '40mg'$  BID and this does control his reflux.  He is not on iron at this time.  The patient denies any melena or BRBPR.  Again, we noted on routine lab testing for his yearly exam in 11/2019 that his HgB was low at 10.8.  He last colonoscopy was in 09/2017 and this showed multiple polyps. GI wanted to repeat this in 2022.  We did iron studies in 11/2019 which showed that his serum iron and ferritin levels were low with a normal TIBC and reticulocyte count. Today, he denies any SOB, generalized weakness, or other problems.       Are there smokers in your home (other than you)? Yes  Risk Factors Current exercise habits:  as above   Dietary issues discussed: none   Depression Screen (Note: if answer to either of the following is "Yes", a more complete depression screening is indicated)   Over the past two weeks, have you felt down, depressed or hopeless? No  Over the past two weeks, have you felt little interest or pleasure in doing things? No  Have you lost interest or pleasure in daily life? No  Do you often feel hopeless? No  Do you cry easily over simple problems? No  Activities of Daily Living In your present state of health, do you have any difficulty performing the following activities?:  Driving? No Managing money?  No Feeding yourself? No Getting from bed to chair? No Climbing a flight of stairs? No Preparing food and eating?: No Bathing or showering? No Getting dressed: No Getting to the toilet? No Using the toilet:No Moving around from place to place: No In the past year have you fallen or had a near fall?:No   Are you sexually active?  No  Do you have more than one partner?  No  Hearing Difficulties: No Do you often ask people to speak up or repeat themselves? Yes Do you experience ringing or noises in your ears? No Do you have difficulty understanding soft  or whispered voices? Yes   Do you feel that you have a problem with memory? No  Do you often misplace items? No  Do you feel safe at home?  Yes  Cognitive Testing  Alert? Yes  Normal Appearance?Yes  Oriented to person? Yes  Place? Yes   Time? Yes  Recall of three objects?  Yes  Can  perform simple calculations? Yes  Displays appropriate judgment?Yes  Can read the correct time from a watch face?Yes  Fall Risk Prevention  Any stairs in or around the home? No  If so, are there any without handrails? No  Home free of loose throw rugs in walkways, pet beds, electrical cords, etc? No  Adequate lighting in your home to reduce risk of falls? Yes  Use of a cane, walker or w/c? No    Time Up and Go  Was the test performed? Yes .  Length of time to ambulate 10 feet: 7 sec.   Gait steady and fast without use of assistive device    Advanced Directives have been discussed with the patient? No   List the Names of Other Physician/Practitioners you currently use: Patient Care Team: Townsend Roger, MD as PCP - General (Internal Medicine) Elouise Munroe, MD as PCP - Cardiology (Cardiology)    Past Medical History:  Diagnosis Date   CKD (chronic kidney disease), stage III ([place])    Coronary artery disease    GAD (generalized anxiety disorder)    GERD (gastroesophageal reflux disease)    History of prostate cancer    Hyperlipidemia    Hypertension    Stress incontinence    Vitamin B12 deficiency     Past Surgical History:  Procedure Laterality Date   CORONARY ARTERY BYPASS GRAFT N/A 10/21/2019   Procedure: CORONARY ARTERY BYPASS GRAFTING (CABG) x three, using left internal mammary artery, left radial artery harvested endoscopically and right greater saphenous vein harvested endoscopically;  Surgeon: Ivin Poot, MD;  Location: [facility];  Service: Open Heart Surgery;  Laterality: N/A;   LEFT HEART CATH AND CORONARY ANGIOGRAPHY N/A 10/14/2019   Procedure: LEFT HEART CATH AND  CORONARY ANGIOGRAPHY;  Surgeon: Lorretta Harp, MD;  Location: [facility];  Service: Cardiovascular;  Laterality: N/A;   PROSTATECTOMY     RADIAL ARTERY HARVEST Left 10/21/2019   Procedure: RIGHT RADIAL ARTERY HARVESTED ENDOSCOPICALLY;  Surgeon: Ivin Poot, MD;  Location: [facility];  Service: Open Heart Surgery;  Laterality: Left;   TEE WITHOUT CARDIOVERSION N/A 10/21/2019   Procedure: TRANSESOPHAGEAL ECHOCARDIOGRAM (TEE);  Surgeon: Prescott Gum, Collier Salina, MD;  Location: [place];  Service: Open Heart Surgery;  Laterality: N/A;      Current Medications  Current Outpatient Medications  Medication Sig Dispense Refill   ALPRAZolam (XANAX) 0.5 MG tablet Take 1 tablet (0.5 mg total) by mouth at bedtime for 135 doses. 45 tablet 2   amLODipine (NORVASC) 10 MG tablet Take 1 tablet (10 mg total) by mouth daily. 90 tablet 3   aspirin 81 MG EC tablet Take 1 tablet (81 mg total) by mouth daily. Swallow whole. 30 tablet 12   carvedilol (COREG) 6.25 MG tablet Take 1 tablet (6.25 mg total) by mouth 2 (two) times daily. 180 tablet 3   ezetimibe (ZETIA) 10 MG tablet Take 10 mg by mouth daily.     famotidine (PEPCID) 40 MG tablet Take 1 tablet (40 mg total) by mouth 2 (two) times daily. 180 tablet 1   lisinopril (ZESTRIL) 40 MG tablet Take 1 tablet (40 mg total) by mouth daily. 90 tablet 1   nitroGLYCERIN (NITROSTAT) 0.4 MG SL tablet See admin instructions.     vitamin B-12 (CYANOCOBALAMIN) 1000 MCG tablet Take 1,000 mcg by mouth daily.     No current facility-administered medications for this visit.    Allergies Imdur [isosorbide nitrate]   Social History Social History  Tobacco Use   Smoking status: Never   Smokeless tobacco: Never  Substance Use Topics   Alcohol use: Not Currently     Review of Systems Review of Systems  Constitutional:  Negative for chills, fever, malaise/fatigue and weight loss.  HENT:  Negative for hearing loss and tinnitus.   Eyes:  Negative for blurred vision and  double vision.  Respiratory:  Negative for cough, shortness of breath and wheezing.   Cardiovascular:  Negative for chest pain, palpitations and leg swelling.  Gastrointestinal:  Negative for abdominal pain, blood in stool, constipation, diarrhea, heartburn, melena, nausea and vomiting.  Genitourinary:  Negative for hematuria.  Musculoskeletal:  Positive for myalgias.  Skin:  Negative for itching and rash.  Neurological:  Negative for dizziness, weakness and headaches.  Psychiatric/Behavioral:  Negative for depression. The patient is not nervous/anxious.      Physical Exam:      Body mass index is 31.02 kg/m. BP (!) 168/78   Pulse (!) 58   Temp (!) 97.3 F (36.3 C)   Resp 16   Ht '5\' 5"'$  (1.651 m)   Wt 186 lb 6.4 oz (84.6 kg)   SpO2 98%   BMI 31.02 kg/m   Physical Exam Constitutional:      Appearance: Normal appearance. He is not ill-appearing.  HENT:     Head: Normocephalic and atraumatic.     Right Ear: Tympanic membrane, ear canal and external ear normal.     Left Ear: Tympanic membrane, ear canal and external ear normal.     Nose: Nose normal. No congestion or rhinorrhea.     Mouth/Throat:     Mouth: Mucous membranes are dry.     Pharynx: Oropharynx is clear. No oropharyngeal exudate or posterior oropharyngeal erythema.  Eyes:     General: No scleral icterus.    Conjunctiva/sclera: Conjunctivae normal.     Pupils: Pupils are equal, round, and reactive to light.  Neck:     Vascular: No carotid bruit.  Cardiovascular:     Rate and Rhythm: Normal rate and regular rhythm.     Pulses: Normal pulses.     Heart sounds: No murmur heard.    No friction rub. No gallop.  Pulmonary:     Effort: Pulmonary effort is normal. No respiratory distress.     Breath sounds: No wheezing, rhonchi or rales.  Abdominal:     General: Abdomen is flat. Bowel sounds are normal. There is no distension.     Palpations: Abdomen is soft.     Tenderness: There is no abdominal tenderness.   Musculoskeletal:     Cervical back: Neck supple. No tenderness.     Right lower leg: No edema.     Left lower leg: No edema.  Lymphadenopathy:     Cervical: No cervical adenopathy.  Skin:    General: Skin is warm and dry.     Findings: No rash.  Neurological:     General: No focal deficit present.     Mental Status: He is alert and oriented to person, place, and time.  Psychiatric:        Mood and Affect: Mood normal.        Behavior: Behavior normal.      Assessment:      Coronary artery disease involving coronary bypass graft of native heart without angina pectoris  Essential hypertension  Mixed hyperlipidemia  Gastroesophageal reflux disease, unspecified whether esophagitis present  Stage 3a chronic kidney disease (HCC)  Stress incontinence  History  of prostate cancer  GAD (generalized anxiety disorder)  BMI 31.0-31.9,adult  Class 1 obesity with serious comorbidity and body mass index (BMI) of 31.0 to 31.9 in adult, unspecified obesity type    Plan:     During the course of the visit the patient was educated and counseled about appropriate screening and preventive services including:   Pneumococcal vaccine  Influenza vaccine Colorectal cancer screening  Diet review for nutrition referral? Yes ____  Not Indicated _X___   Patient Instructions (the written plan) was given to the patient.  CAD (coronary artery disease) He has a history of CAD with CABG in 2021.  He is seen by cardiology yearly.  We will continue risk factor modfication.  He remains on an ASA.  He denies any angina today.  Essential hypertension His BP was elevated but has been controlled on previous visits since cardiology went up on his dose of amlodipine.  He denies any orthostasis today.  We will see what his BP is doing on his next visit.  Gastroesophageal reflux disease I want him to continue on his pepcid twice a day.  He states it controls his reflux.  CKD (chronic kidney  disease) stage 3, GFR 30-59 ml/min He had no protein in his urine last year despite having Stage III CKD.  We will repeat his urine studies.  He had side effects from farxiga in the past.  I want him to avoid NSAIDS.  BMI 31.0-31.9,adult I want him to eat healthy, exercise and lose weight.  Class 1 obesity with serious comorbidity and body mass index (BMI) of 31.0 to 31.9 in adult Plan as above.  GAD (generalized anxiety disorder) His anxiety is controlled on his xanax.  History of prostate cancer The patient was just seen by urology last month and his PSA testing was normal.  His prostate cancer is in remission.  Hyperlipidemia He is having upper arm myalgias which could be related to his lipitor.  I want him to take lipitor every other day at this time to see if this helps his myalgias.  We will check a FLP today.  Stress incontinence Care as directed per Urology.  They have tried him on myrbetriq.   Prevention Health maintenance was discussed.  He needs a repeat colonoscopy this year and we will arrange that.  We will obtain some yearly labs.  Medicare Attestation I have personally reviewed: The patient's medical and social history Their use of alcohol, tobacco or illicit drugs Their current medications and supplements The patient's functional ability including ADLs,fall risks, home safety risks, cognitive, and hearing and visual impairment Diet and physical activities Evidence for depression or mood disorders  The patient's weight, height, and BMI have been recorded in the chart.  I have made referrals, counseling, and provided education to the patient based on review of the above and I have provided the patient with a written personalized care plan for preventive services.     Townsend Roger, MD   09/30/2022

## 2022-09-30 NOTE — Assessment & Plan Note (Signed)
The patient was just seen by urology last month and his PSA testing was normal.  His prostate cancer is in remission.

## 2022-09-30 NOTE — Assessment & Plan Note (Signed)
I want him to eat healthy, exercise and lose weight.

## 2022-09-30 NOTE — Assessment & Plan Note (Signed)
He is having upper arm myalgias which could be related to his lipitor.  I want him to take lipitor every other day at this time to see if this helps his myalgias.  We will check a FLP today.

## 2022-10-02 LAB — CBC WITH DIFFERENTIAL/PLATELET
Basophils Absolute: 0.1 10*3/uL (ref 0.0–0.2)
Basos: 1 %
EOS (ABSOLUTE): 0.3 10*3/uL (ref 0.0–0.4)
Eos: 4 %
Hematocrit: 43.3 % (ref 37.5–51.0)
Hemoglobin: 14.6 g/dL (ref 13.0–17.7)
Immature Grans (Abs): 0 10*3/uL (ref 0.0–0.1)
Immature Granulocytes: 0 %
Lymphocytes Absolute: 2.2 10*3/uL (ref 0.7–3.1)
Lymphs: 29 %
MCH: 28.4 pg (ref 26.6–33.0)
MCHC: 33.7 g/dL (ref 31.5–35.7)
MCV: 84 fL (ref 79–97)
Monocytes Absolute: 0.7 10*3/uL (ref 0.1–0.9)
Monocytes: 10 %
Neutrophils Absolute: 4.2 10*3/uL (ref 1.4–7.0)
Neutrophils: 56 %
Platelets: 196 10*3/uL (ref 150–450)
RBC: 5.14 x10E6/uL (ref 4.14–5.80)
RDW: 12.4 % (ref 11.6–15.4)
WBC: 7.5 10*3/uL (ref 3.4–10.8)

## 2022-10-02 LAB — LIPID PANEL
Chol/HDL Ratio: 2.9 ratio (ref 0.0–5.0)
Cholesterol, Total: 135 mg/dL (ref 100–199)
HDL: 46 mg/dL (ref >39)
LDL Chol Calc (NIH): 68 mg/dL (ref 0–99)
Triglycerides: 114 mg/dL (ref 0–149)
VLDL Cholesterol Cal: 21 mg/dL (ref 5–40)

## 2022-10-02 LAB — CMP14 + ANION GAP
ALT: 31 IU/L (ref 0–44)
AST: 25 IU/L (ref 0–40)
Albumin/Globulin Ratio: 2.2 (ref 1.2–2.2)
Albumin: 4.4 g/dL (ref 3.9–4.9)
Alkaline Phosphatase: 70 IU/L (ref 44–121)
Anion Gap: 19 mmol/L — ABNORMAL HIGH (ref 10.0–18.0)
BUN/Creatinine Ratio: 12 (ref 10–24)
BUN: 14 mg/dL (ref 8–27)
Bilirubin Total: 0.6 mg/dL (ref 0.0–1.2)
CO2: 19 mmol/L — ABNORMAL LOW (ref 20–29)
Calcium: 9.6 mg/dL (ref 8.6–10.2)
Chloride: 106 mmol/L (ref 96–106)
Creatinine, Ser: 1.16 mg/dL (ref 0.76–1.27)
Globulin, Total: 2 g/dL (ref 1.5–4.5)
Glucose: 90 mg/dL (ref 70–99)
Potassium: 5.3 mmol/L — ABNORMAL HIGH (ref 3.5–5.2)
Sodium: 144 mmol/L (ref 134–144)
Total Protein: 6.4 g/dL (ref 6.0–8.5)
eGFR: 69 mL/min/{1.73_m2} (ref >59)

## 2022-10-02 LAB — MICROALBUMIN / CREATININE URINE RATIO
Creatinine, Urine: 270.3 mg/dL
Microalb/Creat Ratio: 4 mg/g creat (ref 0–29)
Microalbumin, Urine: 10.7 ug/mL

## 2022-10-02 LAB — HEMOGLOBIN A1C
Est. average glucose Bld gHb Est-mCnc: 123 mg/dL
Hgb A1c MFr Bld: 5.9 % — ABNORMAL HIGH (ref 4.8–5.6)

## 2022-10-02 LAB — TSH: TSH: 1.1 u[IU]/mL (ref 0.450–4.500)

## 2022-10-06 ENCOUNTER — Other Ambulatory Visit: Payer: Self-pay | Admitting: Internal Medicine

## 2022-11-23 ENCOUNTER — Other Ambulatory Visit: Payer: Self-pay | Admitting: Internal Medicine

## 2022-11-25 ENCOUNTER — Other Ambulatory Visit: Payer: Self-pay | Admitting: Internal Medicine

## 2022-11-25 MED ORDER — ALPRAZOLAM 0.5 MG PO TABS: 0.5000 mg | ORAL_TABLET | Freq: Two times a day (BID) | ORAL | 2 refills | Status: DC | PRN

## 2022-12-21 DIAGNOSIS — R351 Nocturia: Secondary | ICD-10-CM | POA: Diagnosis not present

## 2022-12-21 DIAGNOSIS — N393 Stress incontinence (female) (male): Secondary | ICD-10-CM | POA: Diagnosis not present

## 2022-12-27 ENCOUNTER — Other Ambulatory Visit: Payer: Self-pay | Admitting: Internal Medicine

## 2023-01-03 DIAGNOSIS — H2513 Age-related nuclear cataract, bilateral: Secondary | ICD-10-CM | POA: Diagnosis not present

## 2023-01-09 ENCOUNTER — Ambulatory Visit: Payer: PPO | Admitting: Internal Medicine

## 2023-01-09 ENCOUNTER — Encounter: Payer: Self-pay | Admitting: Internal Medicine

## 2023-01-09 VITALS — BP 152/88 | HR 58 | Temp 98.0°F | Resp 16 | Ht 65.0 in | Wt 181.4 lb

## 2023-01-09 DIAGNOSIS — I10 Essential (primary) hypertension: Secondary | ICD-10-CM | POA: Diagnosis not present

## 2023-01-09 DIAGNOSIS — L570 Actinic keratosis: Secondary | ICD-10-CM | POA: Diagnosis not present

## 2023-01-09 DIAGNOSIS — L821 Other seborrheic keratosis: Secondary | ICD-10-CM | POA: Diagnosis not present

## 2023-01-09 DIAGNOSIS — D225 Melanocytic nevi of trunk: Secondary | ICD-10-CM | POA: Diagnosis not present

## 2023-01-09 DIAGNOSIS — L814 Other melanin hyperpigmentation: Secondary | ICD-10-CM | POA: Diagnosis not present

## 2023-01-09 NOTE — Progress Notes (Signed)
Office Visit  Subjective   Patient ID: Rodney Young   DOB: [birth year]   Age: 67 y.o.   MRN: [id number]   Chief Complaint Chief Complaint  Patient presents with   Follow-up    Hypertension     History of Present Illness The patient is a 67 year old Caucasian/White male who presents for a follow-up evaluation of hypertension.  On his last visit, his BP was elevated and we were going to bring him in for repeat BP.  He did see his cardiologist in 12/2021 where they noted his BP was elevated at that time and they increased his amlodipine from 5mg  to 10mg  daily. However, this past year he did complain of some orthostasis where he states he would have some "swimmy headedness" especially first thing in the morning after he would take his pills.  He was taking his amlodipine 10mg  in the morning and I asked him to switch it to the evening and he states he occasionally gets dizzy at night.  He was having some dizziness where I also discontinued his HCTZ component of his lisinopril-HCT.  The patient has been checking his blood pressure at home. The patient's blood pressure has ranged systolically in the 150's. The patient's current medications include: lisinopril 40mg  daily, amlodipine 10mg  qhs and carvedilol 6.25mg  BID.  The patient has been tolerating his medications well. The patient denies any headache, shortness of breath, weakness/numbness, and edema. He does have a history of white coat syndrome.     Past Medical History Past Medical History:  Diagnosis Date   CKD (chronic kidney disease), stage III (HCC)    Coronary artery disease    GAD (generalized anxiety disorder)    GERD (gastroesophageal reflux disease)    History of prostate cancer    Hyperlipidemia    Hypertension    Stress incontinence    Vitamin B12 deficiency      Allergies Allergies  Allergen Reactions   Imdur [Isosorbide Nitrate] Other (See Comments)    " headache"     Medications  Current Outpatient Medications:     ALPRAZolam (XANAX) 0.5 MG tablet, Take 1 tablet (0.5 mg total) by mouth every 12 (twelve) hours as needed for anxiety., Disp: 45 tablet, Rfl: 2   amLODipine (NORVASC) 10 MG tablet, Take 1 tablet (10 mg total) by mouth daily., Disp: 90 tablet, Rfl: 3   aspirin 81 MG EC tablet, Take 1 tablet (81 mg total) by mouth daily. Swallow whole., Disp: 30 tablet, Rfl: 12   atorvastatin (LIPITOR) 80 MG tablet, TAKE 1 TABLET BY MOUTH ONCE DAILY AT BEDTIME, Disp: 90 tablet, Rfl: 0   carvedilol (COREG) 6.25 MG tablet, Take 1 tablet (6.25 mg total) by mouth 2 (two) times daily., Disp: 180 tablet, Rfl: 3   ezetimibe (ZETIA) 10 MG tablet, Take 1 tablet by mouth once daily, Disp: 90 tablet, Rfl: 0   famotidine (PEPCID) 40 MG tablet, Take 1 tablet (40 mg total) by mouth 2 (two) times daily., Disp: 180 tablet, Rfl: 1   lisinopril (ZESTRIL) 40 MG tablet, Take 1 tablet by mouth once daily, Disp: 90 tablet, Rfl: 0   nitroGLYCERIN (NITROSTAT) 0.4 MG SL tablet, See admin instructions., Disp: , Rfl:    vitamin B-12 (CYANOCOBALAMIN) 1000 MCG tablet, Take 1,000 mcg by mouth daily., Disp: , Rfl:    Review of Systems Review of Systems  Constitutional:  Negative for chills and fever.  Eyes:  Negative for blurred vision and double vision.  Respiratory:  Negative for  shortness of breath.   Cardiovascular:  Negative for chest pain, palpitations and leg swelling.  Neurological:  Positive for dizziness. Negative for weakness and headaches.       Objective:    Vitals BP (!) 152/88   Pulse (!) 58   Temp 98 F (36.7 C)   Resp 16   Ht 5\' 5"  (1.651 m)   Wt 181 lb 6.4 oz (82.3 kg)   SpO2 98%   BMI 30.19 kg/m    Physical Examination Physical Exam Constitutional:      Appearance: Normal appearance. He is not ill-appearing.  Cardiovascular:     Rate and Rhythm: Normal rate and regular rhythm.     Pulses: Normal pulses.     Heart sounds: No murmur heard.    No friction rub. No gallop.  Pulmonary:     Effort:  Pulmonary effort is normal. No respiratory distress.     Breath sounds: No wheezing, rhonchi or rales.  Abdominal:     General: Abdomen is flat. Bowel sounds are normal. There is no distension.     Palpations: Abdomen is soft.     Tenderness: There is no abdominal tenderness.  Musculoskeletal:     Right lower leg: No edema.     Left lower leg: No edema.  Skin:    General: Skin is warm and dry.     Findings: No rash.  Neurological:     Mental Status: He is alert.        Assessment & Plan:   No problem-specific Assessment & Plan notes found for this encounter.    No follow-ups on file.   Crist Fat, MD

## 2023-01-09 NOTE — Assessment & Plan Note (Signed)
His BP is elevated but I am not inclined to increase his BP meds as he states the amlodipine still makes him dizzy at times.  I want him to continue his meds as they are and stay away from salt.  He was supposed to have a visit with cardiology but he states they mixed up his appointments and he goes back in Oct.

## 2023-01-10 ENCOUNTER — Other Ambulatory Visit: Payer: Self-pay | Admitting: Internal Medicine

## 2023-01-10 LAB — BASIC METABOLIC PANEL
BUN/Creatinine Ratio: 10 (ref 10–24)
BUN: 13 mg/dL (ref 8–27)
CO2: 25 mmol/L (ref 20–29)
Calcium: 9.8 mg/dL (ref 8.6–10.2)
Chloride: 105 mmol/L (ref 96–106)
Creatinine, Ser: 1.27 mg/dL (ref 0.76–1.27)
Glucose: 103 mg/dL — ABNORMAL HIGH (ref 70–99)
Potassium: 4.8 mmol/L (ref 3.5–5.2)
Sodium: 144 mmol/L (ref 134–144)
eGFR: 62 mL/min/{1.73_m2} (ref >59)

## 2023-01-11 ENCOUNTER — Other Ambulatory Visit: Payer: Self-pay

## 2023-01-11 MED ORDER — CARVEDILOL 6.25 MG PO TABS: 6.2500 mg | ORAL_TABLET | Freq: Two times a day (BID) | ORAL | 1 refills | Status: DC

## 2023-02-08 ENCOUNTER — Other Ambulatory Visit: Payer: Self-pay | Admitting: Internal Medicine

## 2023-02-13 ENCOUNTER — Other Ambulatory Visit: Payer: Self-pay | Admitting: *Deleted

## 2023-02-13 ENCOUNTER — Telehealth: Payer: Self-pay | Admitting: Internal Medicine

## 2023-02-13 MED ORDER — AMLODIPINE BESYLATE 10 MG PO TABS: 10.0000 mg | ORAL_TABLET | Freq: Every day | ORAL | 1 refills | Status: DC

## 2023-02-13 NOTE — Telephone Encounter (Signed)
*  STAT* If patient is at the pharmacy, call can be transferred to refill team.   1. Which medications need to be refilled? (please list name of each medication and dose if known) amLODipine (NORVASC) 10 MG tablet   2. Which pharmacy/location (including street and city if local pharmacy) is medication to be sent to? [facility] [address] [place], [place] - [address]    3. Do they need a 30 day or 90 day supply? 90 day

## 2023-03-30 ENCOUNTER — Other Ambulatory Visit: Payer: Self-pay | Admitting: Internal Medicine

## 2023-04-06 ENCOUNTER — Other Ambulatory Visit: Payer: Self-pay

## 2023-04-06 MED ORDER — EZETIMIBE 10 MG PO TABS: 10.0000 mg | ORAL_TABLET | Freq: Every day | ORAL | 0 refills | Status: DC

## 2023-04-06 NOTE — Progress Notes (Signed)
Rx refill

## 2023-04-10 ENCOUNTER — Encounter: Payer: Self-pay | Admitting: Internal Medicine

## 2023-04-10 ENCOUNTER — Ambulatory Visit: Payer: PPO | Admitting: Internal Medicine

## 2023-04-10 VITALS — BP 136/84 | HR 61 | Temp 98.5°F | Resp 18 | Ht 65.0 in | Wt 184.0 lb

## 2023-04-10 DIAGNOSIS — E782 Mixed hyperlipidemia: Secondary | ICD-10-CM | POA: Diagnosis not present

## 2023-04-10 DIAGNOSIS — I10 Essential (primary) hypertension: Secondary | ICD-10-CM

## 2023-04-10 DIAGNOSIS — F411 Generalized anxiety disorder: Secondary | ICD-10-CM

## 2023-04-10 MED ORDER — ALPRAZOLAM 0.5 MG PO TABS: 0.5000 mg | ORAL_TABLET | Freq: Two times a day (BID) | ORAL | 2 refills | Status: DC | PRN

## 2023-04-10 MED ORDER — EZETIMIBE 10 MG PO TABS: 10.0000 mg | ORAL_TABLET | Freq: Every day | ORAL | 3 refills | Status: DC

## 2023-04-10 MED ORDER — ATORVASTATIN CALCIUM 80 MG PO TABS: 80.0000 mg | ORAL_TABLET | Freq: Every day | ORAL | 3 refills | Status: DC

## 2023-04-10 NOTE — Assessment & Plan Note (Signed)
He has a history of HLD in the setting of CAD.  His FLP showed his cholesterol was controlled in 09/2022 at his yearly exam but over the interim he did switch his lipitor to every other day dosing due to myalgias.  We will recheck his FLP with goal LDL <70.

## 2023-04-10 NOTE — Assessment & Plan Note (Signed)
His BP is doing ok today and is controlled.  Again, I am not going to adjust his meds as he has some dizziness at times.  We will continue to monitor.

## 2023-04-10 NOTE — Progress Notes (Signed)
Office Visit  Subjective   Patient ID: Rodney Young   DOB: 29-May-1956   Age: 67 y.o.   MRN: [id number]   Chief Complaint Chief Complaint  Patient presents with   Follow-up     History of Present Illness The patient is a 67 year old Caucasian/White male who presents for a follow-up evaluation of hypertension.  On his last visit, his BP was elevated buty we did not adjust his BP meds as he has dizziness at times.  He did see his cardiologist in 12/2021 where they noted his BP was elevated at that time and they increased his amlodipine from 5mg  to 10mg  daily. However, this past year he did complain of some orthostasis where he states he would have some "swimmy headedness" especially first thing in the morning after he would take his pills.  He was taking his amlodipine 10mg  in the morning and I asked him to switch it to the evening and he states he occasionally gets dizzy at night.  He was having some dizziness where I also discontinued his HCTZ component of his lisinopril-HCT.  The patient has been checking his blood pressure at home. The patient's blood pressure has ranged systolically in the 130-150 range. The patient's current medications include: lisinopril 40mg  daily, amlodipine 10mg  qhs and carvedilol 6.25mg  BID.  The patient has been tolerating his medications well. The patient denies any headache, shortness of breath, weakness/numbness, and edema. He does have a history of white coat syndrome.  The patient also has a history of anxiety disorder. Over the interim, there has been no change in his anxiety. He was diagnosed with anxiety disorder in the 1990's. He is currently on Xanax 0.5mg  where he takes 1/2 pill BID. I have discussed using a SSRI with him in the past but he tells me he has been on xanax for the last 5-6 years and this controls him. He has never been placed on a controller medicine per his account. The symptoms have been present for years and are described as mild in severity  and at times it can be a bit worse. He reports no additional symptoms except insomnia. He denies difficulty concentrating, difficulty performing routine daily activities, fatigue, extreme feelings of guilt, feelings of isolation, feelings of worthlessness, helpless feeling, suicidal ideation, homicidal ideation, weight gain, weight loss, insomnia, loss of appetite, social withdrawal, and loss of interest in pleasurable activities. This patient feels that he is able to care for himself. She currently lives with his girlfriend. She has no significant prior history of mental health disorders.  Rodney Young returns today for routine followup on his cholesterol.  His cholesterol was elevated on his yearly exam from 10/2018 where I initially started him on pravastatin.  He began having myalgias with pravastatin so we switched him to Lipitor 20mg  which he was taking every other day.  We then changed his Lipitor to crestor in the setting of CAD.  He was on crestor but his cholesterol was not controlled.  We changed him to Lipitor 80mg  daily and added zetia 10mg  daily.  He was also having myalgias with crestor.  He is having some myalgias in his upper arms from lipitor and over the interim, he started taking his lipitor every other day.   He specifically denies abdominal pain, nausea, vomiting, diarrhea, and fatigue. He remains on dietary management as well as the following cholesterol lowering medications Lipitor 80mg  every other day and zetia 10mg  daily.  He is fasting in anticipation for  labs today.      Past Medical History Past Medical History:  Diagnosis Date   CKD (chronic kidney disease), stage III (HCC)    Coronary artery disease    GAD (generalized anxiety disorder)    GERD (gastroesophageal reflux disease)    History of prostate cancer    Hyperlipidemia    Hypertension    Stress incontinence    Vitamin B12 deficiency      Allergies Allergies  Allergen Reactions   Imdur [Isosorbide  Nitrate] Other (See Comments)    " headache"     Medications  Current Outpatient Medications:    ALPRAZolam (XANAX) 0.5 MG tablet, Take 1 tablet (0.5 mg total) by mouth every 12 (twelve) hours as needed for anxiety., Disp: 45 tablet, Rfl: 2   amLODipine (NORVASC) 10 MG tablet, Take 1 tablet (10 mg total) by mouth daily., Disp: 90 tablet, Rfl: 1   aspirin 81 MG EC tablet, Take 1 tablet (81 mg total) by mouth daily. Swallow whole., Disp: 30 tablet, Rfl: 12   atorvastatin (LIPITOR) 80 MG tablet, TAKE 1 TABLET BY MOUTH ONCE DAILY AT BEDTIME, Disp: 90 tablet, Rfl: 0   carvedilol (COREG) 6.25 MG tablet, Take 1 tablet (6.25 mg total) by mouth 2 (two) times daily., Disp: 180 tablet, Rfl: 1   ezetimibe (ZETIA) 10 MG tablet, Take 1 tablet (10 mg total) by mouth daily., Disp: 90 tablet, Rfl: 0   famotidine (PEPCID) 40 MG tablet, Take 1 tablet (40 mg total) by mouth 2 (two) times daily., Disp: 180 tablet, Rfl: 1   lisinopril (ZESTRIL) 40 MG tablet, Take 1 tablet by mouth once daily, Disp: 90 tablet, Rfl: 0   nitroGLYCERIN (NITROSTAT) 0.4 MG SL tablet, See admin instructions., Disp: , Rfl:    vitamin B-12 (CYANOCOBALAMIN) 1000 MCG tablet, Take 1,000 mcg by mouth daily., Disp: , Rfl:    Review of Systems Review of Systems  Constitutional:  Negative for chills and fever.  Eyes:  Negative for blurred vision and double vision.  Respiratory:  Negative for cough and shortness of breath.   Cardiovascular:  Negative for chest pain, palpitations and leg swelling.  Gastrointestinal:  Negative for abdominal pain, constipation, diarrhea, nausea and vomiting.  Genitourinary:  Negative for frequency.  Musculoskeletal:  Negative for myalgias.  Skin:  Negative for itching and rash.  Neurological:  Positive for dizziness. Negative for weakness and headaches.  Endo/Heme/Allergies:  Negative for polydipsia.       Objective:    Vitals BP 136/84   Pulse 61   Temp 98.5 F (36.9 C)   Resp 18   Ht 5\' 5"  (1.651  m)   Wt 184 lb (83.5 kg)   SpO2 99%   BMI 30.62 kg/m    Physical Examination Physical Exam Constitutional:      Appearance: Normal appearance. He is not ill-appearing.  Cardiovascular:     Rate and Rhythm: Normal rate and regular rhythm.     Pulses: Normal pulses.     Heart sounds: No murmur heard.    No friction rub. No gallop.  Pulmonary:     Effort: Pulmonary effort is normal. No respiratory distress.     Breath sounds: No wheezing, rhonchi or rales.  Abdominal:     General: Abdomen is flat. Bowel sounds are normal. There is no distension.     Palpations: Abdomen is soft.     Tenderness: There is no abdominal tenderness.  Musculoskeletal:     Right lower leg: No edema.  Left lower leg: No edema.  Skin:    General: Skin is warm and dry.     Findings: No rash.  Neurological:     General: No focal deficit present.     Mental Status: He is alert and oriented to person, place, and time.  Psychiatric:        Mood and Affect: Mood normal.        Behavior: Behavior normal.        Assessment & Plan:   Essential hypertension His BP is doing ok today and is controlled.  Again, I am not going to adjust his meds as he has some dizziness at times.  We will continue to monitor.  Hyperlipidemia He has a history of HLD in the setting of CAD.  His FLP showed his cholesterol was controlled in 09/2022 at his yearly exam but over the interim he did switch his lipitor to every other day dosing due to myalgias.  We will recheck his FLP with goal LDL <70.  GAD (generalized anxiety disorder) We refill his xanax at this time.    Return in about 3 months (around 07/10/2023).   Crist Fat, MD

## 2023-04-10 NOTE — Assessment & Plan Note (Signed)
We refill his xanax at this time.

## 2023-04-11 LAB — LIPID PANEL
Chol/HDL Ratio: 3.5 ratio (ref 0.0–5.0)
Cholesterol, Total: 128 mg/dL (ref 100–199)
HDL: 37 mg/dL — ABNORMAL LOW (ref >39)
LDL Chol Calc (NIH): 60 mg/dL (ref 0–99)
Triglycerides: 185 mg/dL — ABNORMAL HIGH (ref 0–149)
VLDL Cholesterol Cal: 31 mg/dL (ref 5–40)

## 2023-04-12 NOTE — Progress Notes (Signed)
His choelsterol is controlled.   Patient aware of lab results

## 2023-04-27 ENCOUNTER — Ambulatory Visit: Payer: PPO

## 2023-04-27 DIAGNOSIS — Z23 Encounter for immunization: Secondary | ICD-10-CM

## 2023-04-27 NOTE — Progress Notes (Signed)
Nurse Visit- Flu

## 2023-04-28 ENCOUNTER — Other Ambulatory Visit: Payer: Self-pay

## 2023-04-28 MED ORDER — LISINOPRIL 40 MG PO TABS: 40.0000 mg | ORAL_TABLET | Freq: Every day | ORAL | 0 refills | Status: DC

## 2023-05-01 ENCOUNTER — Other Ambulatory Visit: Payer: Self-pay | Admitting: Internal Medicine

## 2023-05-01 MED ORDER — ALPRAZOLAM 0.5 MG PO TABS: 0.5000 mg | ORAL_TABLET | Freq: Two times a day (BID) | ORAL | 2 refills | Status: DC | PRN

## 2023-05-04 ENCOUNTER — Ambulatory Visit: Payer: PPO | Attending: Internal Medicine | Admitting: Internal Medicine

## 2023-05-04 ENCOUNTER — Encounter: Payer: Self-pay | Admitting: Internal Medicine

## 2023-05-04 VITALS — BP 130/70 | HR 51 | Ht 65.0 in | Wt 182.0 lb

## 2023-05-04 DIAGNOSIS — I10 Essential (primary) hypertension: Secondary | ICD-10-CM | POA: Diagnosis not present

## 2023-05-04 DIAGNOSIS — Z951 Presence of aortocoronary bypass graft: Secondary | ICD-10-CM | POA: Diagnosis not present

## 2023-05-04 DIAGNOSIS — E785 Hyperlipidemia, unspecified: Secondary | ICD-10-CM | POA: Diagnosis not present

## 2023-05-04 DIAGNOSIS — I2510 Atherosclerotic heart disease of native coronary artery without angina pectoris: Secondary | ICD-10-CM | POA: Diagnosis not present

## 2023-05-04 DIAGNOSIS — Z79899 Other long term (current) drug therapy: Secondary | ICD-10-CM

## 2023-05-04 MED ORDER — ATORVASTATIN CALCIUM 80 MG PO TABS: 40.0000 mg | ORAL_TABLET | Freq: Every day | ORAL | Status: DC

## 2023-05-04 NOTE — Patient Instructions (Addendum)
Medication Instructions:    Reduce Atorvastatin to 40 mg ( take 1/2 tablet of 80 mg )  take it daily at bedtime.  *If you need a refill on your cardiac medications before your next appointment, please call your pharmacy*   Lab Work: Not needed   Testing/Procedures: Not needed   Follow-Up: At [facility], you and your health needs are our priority.  As part of our continuing mission to provide you with exceptional heart care, we have created designated Provider Care Teams.  These Care Teams include your primary Cardiologist (physician) and Advanced Practice Providers (APPs -  Physician Assistants and Nurse Practitioners) who all work together to provide you with the care you need, when you need it.  We recommend signing up for the patient portal called "MyChart".  Sign up information is provided on this After Visit Summary.  MyChart is used to connect with patients for Virtual Visits (Telemedicine).  Patients are able to view lab/test results, encounter notes, upcoming appointments, etc.  Non-urgent messages can be sent to your provider as well.   To learn more about what you can do with MyChart, go to [email or web address].    Your next appointment:   12 month(s)  The format for your next appointment:   In Person  Provider:   patient would like transfer service to [facility] office  Gypsy Balsam, MD, Huntley Dec, MD, Norman Herrlich, MD, or Belva Crome, MD   Other Instructions  You have been referred to [facility]- discuss options

## 2023-05-04 NOTE — Progress Notes (Signed)
Cardiology Office Note:  .   Date:  05/04/2023  ID:  Rodney Young, DOB May 28, 1956, MRN [id number] PCP: Crist Fat, MD  [facility] Providers Cardiologist:  Parke Poisson, MD    History of Present Illness: .   Rodney Young is a 67 y.o. male.  Discussed the use of AI scribe software for clinical note transcription with the patient, who gave verbal consent to proceed.  History of Present Illness   The patient, with a history of hypertension, hyperlipidemia, and coronary artery disease status post coronary artery bypass grafting, presents with complaints of dizziness and lack of energy. The patient reports that the dizziness is particularly noticeable after taking amlodipine, a medication prescribed for hypertension. The timing of the medication has been adjusted to nighttime to mitigate this side effect. This seems to have resolved the issue, taking it in the PM. The patient also reports a lack of energy, which he attributes to a decrease in physical activity since retirement. Likely deconditioning.  In addition to these symptoms, the patient also reports muscle aches, which he believes are a side effect of atorvastatin. The patient has tried adjusting the dosage to every other day, but the muscle aches persist. The patient expresses concern about the side effects of this medication and is hesitant about the suggestion of switching to an injectable cholesterol medication.        ROS: negative except per HPI above.  Studies Reviewed: Marland Kitchen   EKG Interpretation Date/Time:  Thursday May 04 2023 08:10:37 EDT Ventricular Rate:  51 PR Interval:  146 QRS Duration:  112 QT Interval:  414 QTC Calculation: 381 R Axis:   -33  Text Interpretation: Sinus bradycardia Left axis deviation Confirmed by Weston Brass ([id number]) on 05/04/2023 8:15:21 AM    Results   LABS Lipid panel: LDL 60 mg/dL, triglycerides 086 mg/dL, HDL 37 mg/dL (57/84/6962) TSH: within normal limits  (09/2022)     Risk Assessment/Calculations:             Physical Exam:   VS:  BP 130/70 (BP Location: Left Arm)   Pulse (!) 51   Ht 5\' 5"  (1.651 m)   Wt 182 lb (82.6 kg)   SpO2 99%   BMI 30.29 kg/m    Wt Readings from Last 3 Encounters:  05/04/23 182 lb (82.6 kg)  04/10/23 184 lb (83.5 kg)  01/09/23 181 lb 6.4 oz (82.3 kg)    136/70 Physical Exam   CHEST: Lung sounds clear. CARDIOVASCULAR: Heart sounds normal.     GEN: Well nourished, well developed in no acute distress NECK: No JVD; No carotid bruits CARDIAC: RRR, no murmurs, rubs, gallops RESPIRATORY:  Clear to auscultation without rales, wheezing or rhonchi  ABDOMEN: Soft, non-tender, non-distended EXTREMITIES:  No edema; No deformity   ASSESSMENT AND PLAN: .    1. Essential hypertension   2. CAD in native artery   3. S/P CABG x 3   4. Hyperlipidemia, unspecified hyperlipidemia type   5. Medication management     Assessment and Plan    Hypertension Orthostatic symptoms improved with amlodipine 10mg  QHS, lisinopril 40mg  daily, and carvedilol 6.25mg  BID. HCTZ previously discontinued due to dizziness. Patient reports feeling "like walking sideways" after taking amlodipine during the day. -Continue current antihypertensive regimen. - Can consider amlodipine dose to 5mg  BID to potentially reduce side effects, will not change at this time.  Hyperlipidemia Patient on atorvastatin 80mg  every other day and ezetimibe 10mg  daily for secondary prevention of CAD. Patient  reports myalgias with atorvastatin, which he believes is causing his symptoms. Recent lipid panel shows LDL 60, triglycerides 185, HDL 37. -Consider reducing atorvastatin to 40mg  daily to potentially reduce myalgias. He will try this.  -Consider PCSK9I as an alternative to atorvastatin. Set up consultation with pharmacist to discuss this option.  Coronary Artery Disease Status post CABG on October 21, 2019. No new symptoms reported. -Continue current  management.  Follow-up in 1 year. Wants to follow up in [place] office.                Total time of encounter: 30 minutes total time of encounter, including 20 minutes spent in face-to-face patient care on the date of this encounter. This time includes coordination of care and counseling regarding above mentioned problem list. Remainder of non-face-to-face time involved reviewing chart documents/testing relevant to the patient encounter and documentation in the medical record. I have independently reviewed documentation from referring provider.   Weston Brass, MD, [facility] [place]  [facility]

## 2023-05-29 ENCOUNTER — Other Ambulatory Visit: Payer: Self-pay | Admitting: Internal Medicine

## 2023-06-19 ENCOUNTER — Encounter: Payer: Self-pay | Admitting: Pharmacist Clinician (PhC)/ Clinical Pharmacy Specialist

## 2023-06-19 ENCOUNTER — Ambulatory Visit: Payer: PPO | Attending: Cardiology | Admitting: Pharmacist Clinician (PhC)/ Clinical Pharmacy Specialist

## 2023-06-19 DIAGNOSIS — E782 Mixed hyperlipidemia: Secondary | ICD-10-CM

## 2023-06-19 NOTE — Progress Notes (Signed)
Office Visit    Patient Name: Rodney Young Date of Encounter: 06/19/2023  Primary Care Provider:  Crist Fat, MD Primary Cardiologist:  Parke Poisson, MD  Chief Complaint    Hyperlipidemia   Significant Past Medical History   HTN Controlled on amlodipine, carvedilol, lisinopril  CAD 3/21 - CABG x 3  CKD Stage 3 6/24 SCr 1.27, GFR 62     Allergies  Allergen Reactions   Imdur [Isosorbide Nitrate] Other (See Comments)    " headache"    History of Present Illness    Rodney Young is a 67 y.o. male patient of Dr Jacques Navy, in the office today to discuss options for cholesterol management.  He notes that he previously tried rosuvastatin, but had severe myalgias.  He has been on atorvastatin for about 3 years, and has recently noticed that myalgias have returned.   Would really like to discontinue medication and see if he feels better.    Insurance Carrier:  [employer] 1  LDL Cholesterol goal:  LDL < 70  Current Medications:   atorvastatin 40 mg every day, ezetimibe 10 mg every day   Previously tried:  rosuvastatin - myalgias  Family Hx:  father died 34 MI, mother lived into 5's; has 8 siblings 1 with CABG ; daughter healthy  Social Hx: Tobacco: no Alcohol:    no   Diet:  mostly home cooked meals, variety of meats, canned vegetables; likes sweets  Exercise: yard work, keeps active in the year  Adherence Assessment  Do you ever forget to take your medication? [] Yes [x] No  Do you ever skip doses due to side effects? [] Yes [x] No  Do you have trouble affording your medicines? [] Yes [x] No  Are you ever unable to pick up your medication due to transportation difficulties? [] Yes [x] No   Adherence strategy: 7 day minder   Accessory Clinical Findings   Lab Results  Component Value Date   CHOL 128 04/10/2023   HDL 37 (L) 04/10/2023   LDLCALC 60 04/10/2023   TRIG 185 (H) 04/10/2023   CHOLHDL 3.5 04/10/2023    No results found for:  "LIPOA"  Lab Results  Component Value Date   ALT 31 09/30/2022   AST 25 09/30/2022   ALKPHOS 70 09/30/2022   BILITOT 0.6 09/30/2022   Lab Results  Component Value Date   CREATININE 1.27 01/09/2023   BUN 13 01/09/2023   NA 144 01/09/2023   K 4.8 01/09/2023   CL 105 01/09/2023   CO2 25 01/09/2023   Lab Results  Component Value Date   HGBA1C 5.9 (H) 09/30/2022    Home Medications    Current Outpatient Medications  Medication Sig Dispense Refill   ALPRAZolam (XANAX) 0.5 MG tablet Take 1 tablet (0.5 mg total) by mouth every 12 (twelve) hours as needed for anxiety. 45 tablet 2   amLODipine (NORVASC) 10 MG tablet Take 1 tablet (10 mg total) by mouth daily. 90 tablet 1   aspirin 81 MG EC tablet Take 1 tablet (81 mg total) by mouth daily. Swallow whole. 30 tablet 12   atorvastatin (LIPITOR) 80 MG tablet Take 0.5 tablets (40 mg total) by mouth at bedtime.     carvedilol (COREG) 6.25 MG tablet Take 1 tablet (6.25 mg total) by mouth 2 (two) times daily. 180 tablet 1   ezetimibe (ZETIA) 10 MG tablet Take 1 tablet (10 mg total) by mouth daily. 90 tablet 3   famotidine (PEPCID) 40 MG tablet Take 1 tablet by  mouth twice daily 180 tablet 0   lisinopril (ZESTRIL) 40 MG tablet Take 1 tablet (40 mg total) by mouth daily. 90 tablet 0   Multiple Vitamins-Minerals (CENTRUM SILVER PO) Take 1 tablet by mouth daily.     Multiple Vitamins-Minerals (PRESERVISION AREDS 2 PO) Take 1 tablet by mouth daily.     MYRBETRIQ 50 MG TB24 tablet Take 50 mg by mouth daily.     vitamin B-12 (CYANOCOBALAMIN) 1000 MCG tablet Take 1,000 mcg by mouth daily.     nitroGLYCERIN (NITROSTAT) 0.4 MG SL tablet See admin instructions. (Patient not taking: Reported on 06/19/2023)     No current facility-administered medications for this visit.     Assessment & Plan    Hyperlipidemia Assessment: Patient with ASCVD currently at LDL goal of < 70 Most recent LDL 60 on 04/10/23 Has been compliant with high intensity  statin/ezetimibe : atorvastatin 80 mg every other day, ezetimibe 10 mg daily Had significant myalgias with rosuvastatin, currently experiencing worsening myalgias with atorvastatin and needs to discontinue Reviewed options for lowering LDL cholesterol, including PCSK-9 inhibitors, bempedoic acid and inclisiran.  Discussed mechanisms of action, dosing, side effects, potential decreases in LDL cholesterol and costs.  Also reviewed potential options for patient assistance.  Plan: Patient will stop atorvastatin for 6-8 weeks to determine if this is cause of worsening myalgias Repeat labs in January If LDL > 70 without statin (and myalgias improved), will start PA for Repatha 140 mg q14d    Phillips Hay, PharmD CPP [facility] [address]  [place], [place] [address] [phone]  06/19/2023, 2:23 PM

## 2023-06-19 NOTE — Assessment & Plan Note (Signed)
Assessment: Patient with ASCVD currently at LDL goal of < 70 Most recent LDL 60 on 04/10/23 Has been compliant with high intensity statin/ezetimibe : atorvastatin 80 mg every other day, ezetimibe 10 mg daily Had significant myalgias with rosuvastatin, currently experiencing worsening myalgias with atorvastatin and needs to discontinue Reviewed options for lowering LDL cholesterol, including PCSK-9 inhibitors, bempedoic acid and inclisiran.  Discussed mechanisms of action, dosing, side effects, potential decreases in LDL cholesterol and costs.  Also reviewed potential options for patient assistance.  Plan: Patient will stop atorvastatin for 6-8 weeks to determine if this is cause of worsening myalgias Repeat labs in January If LDL > 70 without statin (and myalgias improved), will start PA for Repatha 140 mg q14d

## 2023-06-19 NOTE — Patient Instructions (Signed)
Your Results:             Your most recent labs Goal  Total Cholesterol 128 < 200  Triglycerides 185 < 150  HDL (happy/good cholesterol) 37 > 40  LDL (lousy/bad cholesterol 60 < 70   Medication changes:  Stop atorvastatin.  Stay off for at least 6-8 weeks.   We will repeat your cholesterol labs in January and see what the results are.  At that time I will reach out to you and we can decide whether you wish to take the every 2 weeks shots at home or the every 3 months x 2 then every 6 months.   If you have any questions or concerns call me Belenda Cruise) at [phone]   Thank you for choosing [facility]

## 2023-07-10 ENCOUNTER — Other Ambulatory Visit: Payer: Self-pay | Admitting: Internal Medicine

## 2023-07-11 ENCOUNTER — Ambulatory Visit: Payer: PPO | Admitting: Internal Medicine

## 2023-07-11 ENCOUNTER — Encounter: Payer: Self-pay | Admitting: Internal Medicine

## 2023-07-11 VITALS — BP 130/80 | HR 70 | Temp 98.6°F | Resp 18 | Ht 65.0 in | Wt 182.0 lb

## 2023-07-11 DIAGNOSIS — N1831 Chronic kidney disease, stage 3a: Secondary | ICD-10-CM | POA: Diagnosis not present

## 2023-07-11 DIAGNOSIS — I2510 Atherosclerotic heart disease of native coronary artery without angina pectoris: Secondary | ICD-10-CM | POA: Diagnosis not present

## 2023-07-11 DIAGNOSIS — I10 Essential (primary) hypertension: Secondary | ICD-10-CM | POA: Diagnosis not present

## 2023-07-11 DIAGNOSIS — E782 Mixed hyperlipidemia: Secondary | ICD-10-CM | POA: Diagnosis not present

## 2023-07-11 MED ORDER — LISINOPRIL 40 MG PO TABS: 40.0000 mg | ORAL_TABLET | Freq: Every day | ORAL | 0 refills | Status: DC

## 2023-07-11 NOTE — Assessment & Plan Note (Signed)
His CAD is stable and he denies any angina.  They may have to switch him to a PSK9 inhibitor for his cholesterol as he is having problems with lipitor.  He is not really complaining of myalgias on my visit today but this is more arthralgias probably related to arthritis.

## 2023-07-11 NOTE — Progress Notes (Signed)
Office Visit  Subjective   Patient ID: Rodney Young   DOB: 1955/09/02   Age: 67 y.o.   MRN: [id number]   Chief Complaint Chief Complaint  Patient presents with   Follow-up     History of Present Illness Rodney Young is a 67 yo male who returns for followup of his moderately severe CAD which was diagnosed in 09/2019.  There has been no change since his last visit and he denies any angina.  Over the interim, he did see his cardiologist on 05/04/2023 where she felt his CAD was stable.  They discussed his arthralgias with his statin (see below).  Again, the patient initially saw me in 08/2019 and was reporting some intermittent chest pain and left shoulder pain when he exerted himself. I sent him to cardiology where they did a heart catherization on 10/14/19 which showed a 90% distal RCA lesion, 80% proximal to mid left Cfx lesion, 75% mid LAD lesion and his LV gram showed an EF of 55-65%. He underwent a CABG x3 on 10/21/19 without complication. See PMH for summary of cardiac history and status of revascularization. His CAD is controlled with therapy as summarized in the medication list and previous notes. The patient has no comorbid conditions. He has no baseline symptoms of CAD. He has the following modifiable risk factor(s): HTN, hyperlipidemia, sedentary lifestyle, and obesity. Specifically denied complaint(s):  palpitations, orthopnea, edema, exertional dyspnea, and syncope. He did see his cardiologist in 07/2020 where he was having some feelings of chest pressure that felt like "a ton of bricks". He had a previous myoview in 12/2019 which was normal and she was not sure if this was angina.  He previously did not not tolerate Imdur in the past.  She started him  on NTG but he states he never takes it as he thinks the chest pain has been occurring since he had his COVID-19 vaccine.  The patient uses a riding Surveyor, mining and uses a weed eater once a week and he states this does not trigger his chest  pain/angina.   Today, he denies any exertional chest pain or other problems.    Rodney Young returns today for routine followup on his cholesterol.  He saw his cardiologist on 05/04/2023 where he was complaining of arthralgias in his bilateral shoulders and hips that especially hurt first thing in the morning.  He was not complaining of myalgias.  His cardiologist asked him to cut his lipitor dose in half but he continued to have arthralgias.  He saw the clinical pharmacist on 06/19/2023 who asked him to stop his lipitor to see if his pain went away.  The patient states his pain has improved but he is still having pain in his shoulders and he is using biofreeze.  His last FLP was done on 04/10/2023 and his LDL was at goal at 60.  Again, we noted his cholesterol was elevated on his yearly exam from 10/2018 where I initially started him on pravastatin.  He began having myalgias with pravastatin so we switched him to Lipitor 20mg  which he was taking every other day.  We then changed his Lipitor to crestor in the setting of CAD which was diagnosed in 09/2019.  He was on crestor but his cholesterol was not controlled.  We changed him to Lipitor 80mg  daily and added zetia 10mg  daily.  He was also having myalgias with crestor.  He specifically denies abdominal pain, nausea, vomiting, diarrhea, and fatigue. He remains on dietary management  as well as the following cholesterol lowering medications with zetia 10mg  daily.  He is fasting in anticipation for labs today.   The patient is a 67 year old Caucasian/White male who presents for a follow-up evaluation of hypertension.  He did see cardiology on 05/04/2023 where they noted he still felt some dizziness but they did not change his BP meds.  He noticed this when he took amlodipine in the morning.  Over the interim, he switched the amlodipine to taking it at night and his dizziness is now resolved.    He did see his cardiologist in 12/2021 where they noted his BP was  elevated at that time and they increased his amlodipine from 5mg  to 10mg  daily.    He was having some dizziness where I also discontinued his HCTZ component of his lisinopril-HCT.  The patient has been checking his blood pressure at home. The patient's blood pressure has ranged systolically in the 130-140 range. The patient's current medications include: lisinopril 40mg  daily, amlodipine 10mg  qhs and carvedilol 6.25mg  BID.  The patient has been tolerating his medications well. The patient denies any headache, shortness of breath, weakness/numbness, and edema. He does have a history of white coat syndrome.  He also has a history of Stage IIIa CKD which I believe is due to his history of HTN.  His creatinine over the last 3-4 years has been running from 1.3-1.4 with GFR of 50-60.  He denies use of NSAIDS.  On his last visit, we did start him on farxiga but he states he had side effects of dizziness when taking it and he stopped it.  He was not having increased urinations with the medication.  He also states he cannot afford it as well.  He is just on lisinopril for his kidneys at this time.       Past Medical History Past Medical History:  Diagnosis Date   CKD (chronic kidney disease), stage III (HCC)    Coronary artery disease    GAD (generalized anxiety disorder)    GERD (gastroesophageal reflux disease)    History of prostate cancer    Hyperlipidemia    Hypertension    Stress incontinence    Vitamin B12 deficiency      Allergies Allergies  Allergen Reactions   Imdur [Isosorbide Nitrate] Other (See Comments)    " headache"     Medications  Current Outpatient Medications:    ALPRAZolam (XANAX) 0.5 MG tablet, Take 1 tablet (0.5 mg total) by mouth every 12 (twelve) hours as needed for anxiety., Disp: 45 tablet, Rfl: 2   amLODipine (NORVASC) 10 MG tablet, Take 1 tablet (10 mg total) by mouth daily., Disp: 90 tablet, Rfl: 1   aspirin 81 MG EC tablet, Take 1 tablet (81 mg total) by mouth  daily. Swallow whole., Disp: 30 tablet, Rfl: 12   carvedilol (COREG) 6.25 MG tablet, Take 1 tablet (6.25 mg total) by mouth 2 (two) times daily., Disp: 180 tablet, Rfl: 1   ezetimibe (ZETIA) 10 MG tablet, Take 1 tablet (10 mg total) by mouth daily., Disp: 90 tablet, Rfl: 3   famotidine (PEPCID) 40 MG tablet, Take 1 tablet by mouth twice daily, Disp: 180 tablet, Rfl: 0   lisinopril (ZESTRIL) 40 MG tablet, Take 1 tablet (40 mg total) by mouth daily., Disp: 90 tablet, Rfl: 0   Multiple Vitamins-Minerals (CENTRUM SILVER PO), Take 1 tablet by mouth daily., Disp: , Rfl:    Multiple Vitamins-Minerals (PRESERVISION AREDS 2 PO), Take 1 tablet by  mouth daily., Disp: , Rfl:    MYRBETRIQ 50 MG TB24 tablet, Take 50 mg by mouth daily., Disp: , Rfl:    nitroGLYCERIN (NITROSTAT) 0.4 MG SL tablet, See admin instructions. (Patient not taking: Reported on 06/19/2023), Disp: , Rfl:    vitamin B-12 (CYANOCOBALAMIN) 1000 MCG tablet, Take 1,000 mcg by mouth daily., Disp: , Rfl:    Review of Systems Review of Systems  Constitutional:  Negative for chills, fever and malaise/fatigue.  Eyes:  Negative for blurred vision and double vision.  Respiratory:  Negative for cough and shortness of breath.   Cardiovascular:  Negative for chest pain, palpitations and leg swelling.  Gastrointestinal:  Negative for abdominal pain, constipation, diarrhea, heartburn, nausea and vomiting.  Genitourinary:  Negative for frequency.  Musculoskeletal:  Negative for myalgias.  Skin:  Negative for itching and rash.  Neurological:  Negative for dizziness, weakness and headaches.  Endo/Heme/Allergies:  Negative for polydipsia.       Objective:    Vitals BP 130/80   Pulse 70   Temp 98.6 F (37 C)   Resp 18   Ht 5\' 5"  (1.651 m)   Wt 182 lb (82.6 kg)   SpO2 98%   BMI 30.29 kg/m    Physical Examination Physical Exam Constitutional:      Appearance: Normal appearance. He is not ill-appearing.  Cardiovascular:     Rate and  Rhythm: Normal rate and regular rhythm.     Pulses: Normal pulses.     Heart sounds: No murmur heard.    No friction rub. No gallop.  Pulmonary:     Effort: Pulmonary effort is normal. No respiratory distress.     Breath sounds: No wheezing, rhonchi or rales.  Abdominal:     General: Abdomen is flat. Bowel sounds are normal. There is no distension.     Palpations: Abdomen is soft.     Tenderness: There is no abdominal tenderness.  Musculoskeletal:     Right lower leg: No edema.     Left lower leg: No edema.  Skin:    General: Skin is warm and dry.     Findings: No rash.  Neurological:     General: No focal deficit present.     Mental Status: He is alert and oriented to person, place, and time.  Psychiatric:        Mood and Affect: Mood normal.        Behavior: Behavior normal.        Assessment & Plan:   Essential hypertension His BP is currently controlled.  He is not having any dizziness at this time.  CAD (coronary artery disease) His CAD is stable and he denies any angina.  They may have to switch him to a PSK9 inhibitor for his cholesterol as he is having problems with lipitor.  He is not really complaining of myalgias on my visit today but this is more arthralgias probably related to arthritis.  CKD (chronic kidney disease) stage 3, GFR 30-59 ml/min We will continue to monitor his kidney function.  He was not able to tolerate farxiga.  I want him to continue his lisinopril and avoid all NSAIDS.  Hyperlipidemia He will followup with the lipid pharmacist and have his cholesterol rechecked in a few weeks.  He may need to be on a PSK9 inhibitor.    Return in about 3 months (around 10/09/2023) for annual.   Crist Fat, MD

## 2023-07-11 NOTE — Assessment & Plan Note (Signed)
His BP is currently controlled.  He is not having any dizziness at this time.

## 2023-07-11 NOTE — Assessment & Plan Note (Signed)
He will followup with the lipid pharmacist and have his cholesterol rechecked in a few weeks.  He may need to be on a PSK9 inhibitor.

## 2023-07-11 NOTE — Assessment & Plan Note (Signed)
We will continue to monitor his kidney function.  He was not able to tolerate farxiga.  I want him to continue his lisinopril and avoid all NSAIDS.

## 2023-07-14 NOTE — Telephone Encounter (Signed)
*  STAT* If patient is at the pharmacy, call can be transferred to refill team.   1. Which medications need to be refilled? (please list name of each medication and dose if known)  Carvedilol   2. Would you like to learn more about the convenience, safety, & potential cost savings by using the [facility]?      3. Are you open to using the [facility] (Type [facility].    4. Which pharmacy/location (including street and city if local pharmacy) is medication to be sent to? [facility] [place]   5. Do they need a 30 day or 90 day supply? 90 days # 180 and refill- please call in today

## 2023-08-02 ENCOUNTER — Other Ambulatory Visit: Payer: Self-pay | Admitting: Internal Medicine

## 2023-08-07 ENCOUNTER — Telehealth: Payer: Self-pay | Admitting: Internal Medicine

## 2023-08-07 ENCOUNTER — Other Ambulatory Visit: Payer: Self-pay

## 2023-08-07 MED ORDER — AMLODIPINE BESYLATE 10 MG PO TABS: 10.0000 mg | ORAL_TABLET | Freq: Every day | ORAL | 2 refills | Status: DC

## 2023-08-07 NOTE — Telephone Encounter (Signed)
*  STAT* If patient is at the pharmacy, call can be transferred to refill team.   1. Which medications need to be refilled? (please list name of each medication and dose if known)   amLODipine  (NORVASC ) 10 MG tablet    2. Which pharmacy/location (including street and city if local pharmacy) is medication to be sent to? [facility] [address] [place], [place] - [address] [address]   3. Do they need a 30 day or 90 day supply? 90

## 2023-08-28 ENCOUNTER — Other Ambulatory Visit: Payer: Self-pay

## 2023-08-28 MED ORDER — FAMOTIDINE 40 MG PO TABS: 40.0000 mg | ORAL_TABLET | Freq: Two times a day (BID) | ORAL | 0 refills | Status: DC

## 2023-08-28 MED ORDER — LISINOPRIL 40 MG PO TABS: 40.0000 mg | ORAL_TABLET | Freq: Every day | ORAL | 0 refills | Status: DC

## 2023-08-28 NOTE — Progress Notes (Signed)
Rx refill

## 2023-08-29 ENCOUNTER — Ambulatory Visit: Payer: PPO

## 2023-08-29 DIAGNOSIS — E782 Mixed hyperlipidemia: Secondary | ICD-10-CM

## 2023-08-29 NOTE — Progress Notes (Signed)
Nurse visit for bloodwork

## 2023-08-30 LAB — LIPID PANEL
Chol/HDL Ratio: 6.1 {ratio} — ABNORMAL HIGH (ref 0.0–5.0)
Cholesterol, Total: 231 mg/dL — ABNORMAL HIGH (ref 100–199)
HDL: 38 mg/dL — ABNORMAL LOW (ref >39)
LDL Chol Calc (NIH): 156 mg/dL — ABNORMAL HIGH (ref 0–99)
Triglycerides: 203 mg/dL — ABNORMAL HIGH (ref 0–149)
VLDL Cholesterol Cal: 37 mg/dL (ref 5–40)

## 2023-09-15 NOTE — Progress Notes (Signed)
If stopping lipitor did not make his shoulder pain go away, he needs to restart on lipitor as his cholesteroll is not controlled. Make sure he has refills.   Patient aware of labs, and states he will go back on lipitor. Has a whole bottle of rx right now

## 2023-10-09 ENCOUNTER — Other Ambulatory Visit: Payer: Self-pay | Admitting: Internal Medicine

## 2023-10-11 ENCOUNTER — Other Ambulatory Visit: Payer: Self-pay | Admitting: Internal Medicine

## 2023-10-11 MED ORDER — ALPRAZOLAM 0.5 MG PO TABS: 0.5000 mg | ORAL_TABLET | Freq: Two times a day (BID) | ORAL | 2 refills | Status: DC | PRN

## 2023-10-12 ENCOUNTER — Ambulatory Visit: Payer: PPO | Admitting: Internal Medicine

## 2023-10-12 ENCOUNTER — Encounter: Payer: Self-pay | Admitting: Internal Medicine

## 2023-10-12 VITALS — BP 150/78 | HR 61 | Temp 98.0°F | Resp 18 | Ht 65.0 in | Wt 181.6 lb

## 2023-10-12 DIAGNOSIS — Z0000 Encounter for general adult medical examination without abnormal findings: Secondary | ICD-10-CM | POA: Diagnosis not present

## 2023-10-12 DIAGNOSIS — N393 Stress incontinence (female) (male): Secondary | ICD-10-CM

## 2023-10-12 DIAGNOSIS — I25810 Atherosclerosis of coronary artery bypass graft(s) without angina pectoris: Secondary | ICD-10-CM

## 2023-10-12 DIAGNOSIS — Z8546 Personal history of malignant neoplasm of prostate: Secondary | ICD-10-CM

## 2023-10-12 DIAGNOSIS — Z6830 Body mass index (BMI) 30.0-30.9, adult: Secondary | ICD-10-CM

## 2023-10-12 DIAGNOSIS — N1831 Chronic kidney disease, stage 3a: Secondary | ICD-10-CM | POA: Diagnosis not present

## 2023-10-12 DIAGNOSIS — M791 Myalgia, unspecified site: Secondary | ICD-10-CM

## 2023-10-12 DIAGNOSIS — I10 Essential (primary) hypertension: Secondary | ICD-10-CM

## 2023-10-12 DIAGNOSIS — F411 Generalized anxiety disorder: Secondary | ICD-10-CM

## 2023-10-12 DIAGNOSIS — K219 Gastro-esophageal reflux disease without esophagitis: Secondary | ICD-10-CM | POA: Diagnosis not present

## 2023-10-12 DIAGNOSIS — E66811 Obesity, class 1: Secondary | ICD-10-CM

## 2023-10-12 DIAGNOSIS — T466X5A Adverse effect of antihyperlipidemic and antiarteriosclerotic drugs, initial encounter: Secondary | ICD-10-CM

## 2023-10-12 DIAGNOSIS — E782 Mixed hyperlipidemia: Secondary | ICD-10-CM | POA: Diagnosis not present

## 2023-10-12 DIAGNOSIS — Z789 Other specified health status: Secondary | ICD-10-CM

## 2023-10-12 DIAGNOSIS — E6609 Other obesity due to excess calories: Secondary | ICD-10-CM | POA: Diagnosis not present

## 2023-10-12 HISTORY — DX: Other specified health status: Z78.9

## 2023-10-12 HISTORY — DX: Obesity, class 1: E66.811

## 2023-10-12 HISTORY — DX: Body mass index (BMI) 30.0-30.9, adult: Z68.30

## 2023-10-12 HISTORY — DX: Adverse effect of antihyperlipidemic and antiarteriosclerotic drugs, initial encounter: T46.6X5A

## 2023-10-12 NOTE — Assessment & Plan Note (Signed)
 We tried him on farxiga but he could not tolerate this.  We will continue control of his HTN and he needs to work on his weight.  He is on an ACE-I.  He is to avoid NSAIDS.  We will check his BMP and urine studies today.

## 2023-10-12 NOTE — Assessment & Plan Note (Signed)
His reflux is controlled on pepcid.

## 2023-10-12 NOTE — Assessment & Plan Note (Signed)
 He has statin intolerance to pravastatin, atorvastatin and rosuvastatin.

## 2023-10-12 NOTE — Assessment & Plan Note (Signed)
 He does not have an appointment yet with urology.  We will check a PSA on him today.

## 2023-10-12 NOTE — Assessment & Plan Note (Signed)
 There is no change to his anxiety.  He will continue on xanax.

## 2023-10-12 NOTE — Assessment & Plan Note (Signed)
 He has lost some weight.  I want him to continue to eat healthy, exercise and be active and lose weight.

## 2023-10-12 NOTE — Assessment & Plan Note (Signed)
 I initially thought his shoulder pain was arthritic but he is now telling me the pain his his shoulders went away with stopping lipitor in 06/2023.  I want him to stop the lipitor and we will get him situated to get on repatha.  His goal LDL<70.

## 2023-10-12 NOTE — Assessment & Plan Note (Signed)
 He denies any angina.  We need to get him off lipitor and start him on a PSK9 inhibitor.  He is to continue beta blocker, statin and ASA.

## 2023-10-12 NOTE — Progress Notes (Signed)
 Preventive Screening-Counseling & Management    Rodney Young is a 68 year old Caucasian/White male who presents for his annual wellness exam. This patient's past medical history Anxiety Disorder, Generalized, CAD, Chronic kidney disease, Stage III (moderate), GERD, Hypercholesterolemia, Hypertension, Stress Incontinence, Vitamin B12 Deficiency, and history of prostate cancer.    His last eye exam was in 11/2022 where he has bilateral cataracts they are following but otherwise his vision is doing well. We referred him for colonoscopy last year and he states he got a letter but never went.  He is scheduled to see GI for this colonoscopy on 11/03/2023.  His last colonoscopy was in 06/2020 where they found multiple polyps including adenmatous polyps. They want him to return in 3 years for a repeat colonoscopy. This colonoscopy was done due to iron defiency anemia. His previous colonoscopy was in 09/2017 and this showed multiple polyps. He also had an EGD done in 06/2020 which was normal but they did find H. pylori on biopsy where he was treated. The patient is followed by urology for a history of prostate cancer diagnosed in 2017 as well as stress incontinence. He underwent transurethral vaporization of his prostate in 12/2015 and subsequently underwent prostatectomy in 05/2016. His cancer is in remission. He last saw urology in 08/2022 for chronic stress incontinence and his history of prostate cancer. The patient gets up 1-2 times at night to urinate. He has underwent physical therapy for bladder training and he does feel like he empties his bladder. His last cystoscopy was in 11/2019.  The patient does exercise some where he works out in the yard.  The patient does not smoke.  He does get yearly flu vaccines. He had his Prevnar 13 vaccine in 01/2020. He had pneumovax 23 in 04/07/2021 and he completed shingrix in 11/2020.   He is not interested in the RSV vaccine at this time.  The patient has had 3 COVID-19  vaccines including one booster. The patient denies any depression or memory loss. He is on an ASA 81mg  daily.   Rodney Young is a 68 yo male who returns for followup of his moderately severe CAD which was diagnosed in 09/2019.  There has been no change since his last visit and he denies any angina with no chest pain or SOB.  Over the interim, he did see his cardiologist on 05/04/2023 where she felt his CAD was stable.  He saw cardiology pharmacist on 06/19/2023 where they discussed his arthralgias with his statin (see below).  He was having mylagias to his atorvastatin and they wanted him to stop this for 6-8 weeks.  If his LDL >70, they recommended repatha (See below). Again, the patient initially saw me in 08/2019 and was reporting some intermittent chest pain and left shoulder pain when he exerted himself. I sent him to cardiology where they did a heart catherization on 10/14/19 which showed a 90% distal RCA lesion, 80% proximal to mid left Cfx lesion, 75% mid LAD lesion and his LV gram showed an EF of 55-65%. He underwent a CABG x3 on 10/21/19 without complication. See PMH for summary of cardiac history and status of revascularization. His CAD is controlled with therapy as summarized in the medication list and previous notes. The patient has no comorbid conditions. He has no baseline symptoms of CAD. He has the following modifiable risk factor(s): HTN, hyperlipidemia, sedentary lifestyle, and obesity. Specifically denied complaint(s):  palpitations, orthopnea, edema, exertional dyspnea, and syncope. He did see his cardiologist in  07/2020 where he was having some feelings of chest pressure that felt like "a ton of bricks". He had a previous myoview in 12/2019 which was normal and she was not sure if this was angina.  He previously did not not tolerate Imdur in the past.  She started him  on NTG but he states he never takes it as he thinks the chest pain has been occurring since he had his COVID-19 vaccine.  The  patient uses a riding Surveyor, mining and uses a weed eater once a week and he states this does not trigger his chest pain/angina.   Today, he denies any exertional chest pain or other problems.     Rodney Young returns today for routine followup on his cholesterol.  He saw his cardiologist on 05/04/2023 where he was complaining of arthralgias in his bilateral shoulders and hips that especially hurt first thing in the morning.  He was not complaining of myalgias.  He tells me once he got up and moving around thru the day, this shoulder pain would go away.  His cardiologist asked him to cut his lipitor dose in half but he continued to have arthralgias.  He saw the clinical pharmacist on 06/19/2023 who asked him to stop his lipitor to see if his pain went away.  The patient told me at that time that his pain improved but he was still having pain in his shoulders and he is using biofreeze.  Today, he tells me that when he stopped the atorvastatin in 06/2023, his shoulder pain went completely away.  His  FLP done on 04/10/2023 and his LDL was at goal at 60.  We repeated his FLP in 08/2023 while he was off atorvastatin and his LDL went to 156.  I asked him to restart the atorvastatin and he states he did in 09/2023 and his shoulder pain came back.   Again, we noted his cholesterol was elevated on his yearly exam from 10/2018 where I initially started him on pravastatin.  He began having myalgias with pravastatin so we switched him to Lipitor 20mg  which he was taking every other day.  We then changed his Lipitor to crestor in the setting of CAD which was diagnosed in 09/2019.  He was on crestor but his cholesterol was not controlled and he had myalgias with crestor.  We changed him to Lipitor 80mg  daily and added zetia 10mg  daily.  HHe specifically denies abdominal pain, nausea, vomiting, diarrhea, and fatigue. He remains on dietary management as well as the following cholesterol lowering medications with atorvastatin 80mg  at  bedtime and zetia 10mg  daily.  He is fasting in anticipation for labs today.    The patient is a 68 year old Caucasian/White male who presents for a follow-up evaluation of hypertension.  He did see cardiology on 05/04/2023 where they noted he still felt some dizziness but they did not change his BP meds.  He noticed this when he took amlodipine in the morning.  Over the interim, he switched the amlodipine to taking it at night and his dizziness is now resolved.    He did see his cardiologist in 12/2021 where they noted his BP was elevated at that time and they increased his amlodipine from 5mg  to 10mg  daily.    He was having some dizziness where I also discontinued his HCTZ component of his lisinopril-HCT.  The patient has not been checking his blood pressure at home. The patient's current medications include: lisinopril 40mg  daily, amlodipine 10mg   qhs and carvedilol 6.25mg  BID.  The patient has been tolerating his medications well. The patient denies any headache, shortness of breath, weakness/numbness, and edema. He does have a history of white coat syndrome.   He also has a history of Stage IIIa CKD which I believe is due to his history of HTN.  His creatinine over the last 3-4 years has been running from 1.3-1.4 with GFR of 50-60.  He denies use of NSAIDS.  On his last visit, we did start him on farxiga but he states he had side effects of dizziness when taking it and he stopped it.  He was not having increased urinations with the medication.  He also states he cannot afford it as well.  He is just on lisinopril for his kidneys at this time.  The patient also has a history of anxiety disorder. Over the interim, there has been no change in his anxiety. He was diagnosed with anxiety disorder in the 1990's. He is currently on Xanax 0.5mg  where he takes 1/2 pill BID. I have discussed using a SSRI with him in the past but he tells me he has been on xanax for the last 5-6 years and this controls him. He has never  been placed on a controller medicine per his account. The symptoms have been present for years and are described as mild in severity and at times it can be a bit worse. He reports no additional symptoms except insomnia. He denies difficulty concentrating, difficulty performing routine daily activities, fatigue, extreme feelings of guilt, feelings of isolation, feelings of worthlessness, helpless feeling, suicidal ideation, homicidal ideation, weight gain, weight loss, insomnia, loss of appetite, social withdrawal, and loss of interest in pleasurable activities. This patient feels that he is able to care for himself. She currently lives with his girlfriend. She has no significant prior history of mental health disorders.        Rodney Young also has a history of iron defiency anemia.  We noted on his routine labs in 2021 that he was anemic and iron studies demonstrated iron defiency anemia.  He had a negative FIT test in 03/2020.  He was referred to GI wherere they performed EGD on 06/15/2020.  This EGD report states things looked normal but they did obtain biopsies and he was discovered to have H. Pylori infection.  He states he was given 3 antibiotics at that time.  He was having indigestion and burping with chest tightness and pain where I did not think it was angina and gave him a trial of omeprazole twice a day and placed him on Gas-X.  He states that it helped the indigestion but it did not help his chest pain (see below).  He was taking omeprazole 40mg  daily but is now on pepcid 40mg  BID and this does control his reflux.  He is not on iron at this time.  The patient denies any melena or BRBPR.  Again, we noted on routine lab testing for his yearly exam in 11/2019 that his HgB was low at 10.8.  He last colonoscopy was in 09/2017 and this showed multiple polyps. GI wanted to repeat this in 2022.  We did iron studies in 11/2019 which showed that his serum iron and ferritin levels were low with a normal TIBC and  reticulocyte count. Today, he denies any SOB, generalized weakness, or other problems.       Are there smokers in your home (other than you)? No  Risk Factors Current exercise habits:  as above   Dietary issues discussed: none   Depression Screen (Note: if answer to either of the following is "Yes", a more complete depression screening is indicated)   Over the past two weeks, have you felt down, depressed or hopeless? No  Over the past two weeks, have you felt little interest or pleasure in doing things? No  Have you lost interest or pleasure in daily life? No  Do you often feel hopeless? No  Do you cry easily over simple problems? No  Activities of Daily Living In your present state of health, do you have any difficulty performing the following activities?:  Driving? No Managing money?  No Feeding yourself? No Getting from bed to chair? No Climbing a flight of stairs? No Preparing food and eating?: No Bathing or showering? No Getting dressed: No Getting to the toilet? No Using the toilet:No Moving around from place to place: No In the past year have you fallen or had a near fall?:No   Are you sexually active?  No  Do you have more than one partner?  No  Hearing Difficulties: No Do you often ask people to speak up or repeat themselves? No Do you experience ringing or noises in your ears? No Do you have difficulty understanding soft or whispered voices? No   Do you feel that you have a problem with memory? No  Do you often misplace items? No  Do you feel safe at home?  Yes  Cognitive Testing  Alert? Yes  Normal Appearance?Yes  Oriented to person? Yes  Place? Yes   Time? Yes  Recall of three objects?  Yes  Can perform simple calculations? Yes  Displays appropriate judgment?Yes  Can read the correct time from a watch face?Yes  He scored a 30/30 on his MMSE today   Fall Risk Prevention  Any stairs in or around the home? No  If so, are there any without  handrails? No  Home free of loose throw rugs in walkways, pet beds, electrical cords, etc? Yes  Adequate lighting in your home to reduce risk of falls? Yes  Use of a cane, walker or w/c? No    Time Up and Go  Was the test performed? Yes .  Length of time to ambulate 10 feet: 10 sec.   Gait steady and fast without use of assistive device    Advanced Directives have been discussed with the patient? Yes   List the Names of Other Physician/Practitioners you currently use: Patient Care Team: Crist Fat, MD as PCP - General (Internal Medicine) Parke Poisson, MD as PCP - Cardiology (Cardiology)    Past Medical History:  Diagnosis Date   CKD (chronic kidney disease), stage III (HCC)    Coronary artery disease    GAD (generalized anxiety disorder)    GERD (gastroesophageal reflux disease)    History of prostate cancer    Hyperlipidemia    Hypertension    Stress incontinence    Vitamin B12 deficiency     Past Surgical History:  Procedure Laterality Date   CORONARY ARTERY BYPASS GRAFT N/A 10/21/2019   Procedure: CORONARY ARTERY BYPASS GRAFTING (CABG) x three, using left internal mammary artery, left radial artery harvested endoscopically and right greater saphenous vein harvested endoscopically;  Surgeon: Kerin Perna, MD;  Location: [facility] OR;  Service: Open Heart Surgery;  Laterality: N/A;   LEFT HEART CATH AND CORONARY ANGIOGRAPHY N/A 10/14/2019   Procedure: LEFT HEART CATH AND CORONARY ANGIOGRAPHY;  Surgeon: Nanetta Batty  J, MD;  Location: [facility] INVASIVE CV LAB;  Service: Cardiovascular;  Laterality: N/A;   PROSTATECTOMY     RADIAL ARTERY HARVEST Left 10/21/2019   Procedure: RIGHT RADIAL ARTERY HARVESTED ENDOSCOPICALLY;  Surgeon: Kerin Perna, MD;  Location: [facility] OR;  Service: Open Heart Surgery;  Laterality: Left;   TEE WITHOUT CARDIOVERSION N/A 10/21/2019   Procedure: TRANSESOPHAGEAL ECHOCARDIOGRAM (TEE);  Surgeon: Donata Clay, Theron Arista, MD;  Location: [facility] OR;  Service: Open  Heart Surgery;  Laterality: N/A;      Current Medications  Current Outpatient Medications  Medication Sig Dispense Refill   ALPRAZolam (XANAX) 0.5 MG tablet Take 1 tablet (0.5 mg total) by mouth every 12 (twelve) hours as needed for anxiety. 45 tablet 2   amLODipine (NORVASC) 10 MG tablet Take 1 tablet (10 mg total) by mouth daily. 90 tablet 2   aspirin 81 MG EC tablet Take 1 tablet (81 mg total) by mouth daily. Swallow whole. 30 tablet 12   carvedilol (COREG) 6.25 MG tablet Take 1 tablet by mouth twice daily 180 tablet 3   ezetimibe (ZETIA) 10 MG tablet Take 1 tablet (10 mg total) by mouth daily. 90 tablet 3   famotidine (PEPCID) 40 MG tablet Take 1 tablet (40 mg total) by mouth 2 (two) times daily. 180 tablet 0   lisinopril (ZESTRIL) 40 MG tablet Take 1 tablet (40 mg total) by mouth daily. 90 tablet 0   Multiple Vitamins-Minerals (CENTRUM SILVER PO) Take 1 tablet by mouth daily.     Multiple Vitamins-Minerals (PRESERVISION AREDS 2 PO) Take 1 tablet by mouth daily.     MYRBETRIQ 50 MG TB24 tablet Take 50 mg by mouth daily.     nitroGLYCERIN (NITROSTAT) 0.4 MG SL tablet See admin instructions. (Patient not taking: Reported on 06/19/2023)     vitamin B-12 (CYANOCOBALAMIN) 1000 MCG tablet Take 1,000 mcg by mouth daily.     No current facility-administered medications for this visit.    Allergies Imdur [isosorbide nitrate]   Social History Social History   Tobacco Use   Smoking status: Never   Smokeless tobacco: Never  Substance Use Topics   Alcohol use: Not Currently     Review of Systems Review of Systems  Constitutional:  Negative for chills, fever and malaise/fatigue.  Eyes:  Negative for blurred vision and double vision.  Respiratory:  Negative for cough, hemoptysis, shortness of breath and wheezing.   Cardiovascular:  Negative for chest pain, palpitations and leg swelling.  Gastrointestinal:  Negative for abdominal pain, blood in stool, constipation, diarrhea, heartburn,  melena, nausea and vomiting.  Genitourinary:  Negative for frequency and hematuria.  Musculoskeletal:  Positive for myalgias.  Skin:  Negative for itching and rash.  Neurological:  Negative for dizziness, weakness and headaches.  Endo/Heme/Allergies:  Negative for polydipsia.  Psychiatric/Behavioral:  Negative for depression, memory loss and substance abuse. The patient does not have insomnia.      Physical Exam:      Body mass index is 30.22 kg/m. BP (!) 150/78   Pulse 61   Temp 98 F (36.7 C) (Oral)   Resp 18   Ht 5\' 5"  (1.651 m)   Wt 181 lb 9.6 oz (82.4 kg)   SpO2 97%   BMI 30.22 kg/m   Physical Exam Constitutional:      Appearance: Normal appearance. He is not ill-appearing.  HENT:     Head: Normocephalic and atraumatic.     Right Ear: Tympanic membrane, ear canal and external ear normal.  Left Ear: Tympanic membrane, ear canal and external ear normal.     Nose: Nose normal. No congestion or rhinorrhea.     Mouth/Throat:     Mouth: Mucous membranes are dry.     Pharynx: Oropharynx is clear. No oropharyngeal exudate or posterior oropharyngeal erythema.  Eyes:     General: No scleral icterus.    Conjunctiva/sclera: Conjunctivae normal.     Pupils: Pupils are equal, round, and reactive to light.  Neck:     Vascular: No carotid bruit.  Cardiovascular:     Rate and Rhythm: Normal rate and regular rhythm.     Pulses: Normal pulses.     Heart sounds: No murmur heard.    No friction rub. No gallop.  Pulmonary:     Effort: Pulmonary effort is normal. No respiratory distress.     Breath sounds: No wheezing, rhonchi or rales.  Abdominal:     General: Abdomen is flat. Bowel sounds are normal. There is no distension.     Palpations: Abdomen is soft.     Tenderness: There is no abdominal tenderness.  Musculoskeletal:     Cervical back: Neck supple. No tenderness.     Right lower leg: No edema.     Left lower leg: No edema.  Lymphadenopathy:     Cervical: No  cervical adenopathy.  Skin:    General: Skin is warm and dry.     Findings: No rash.  Neurological:     General: No focal deficit present.     Mental Status: He is alert and oriented to person, place, and time.  Psychiatric:        Mood and Affect: Mood normal.        Behavior: Behavior normal.      Assessment:      Coronary artery disease involving coronary bypass graft of native heart without angina pectoris  Essential hypertension  Mixed hyperlipidemia  Gastroesophageal reflux disease, unspecified whether esophagitis present  Stage 3a chronic kidney disease (HCC)  Stress incontinence  History of prostate cancer  GAD (generalized anxiety disorder)  BMI 30.0-30.9,adult  Class 1 obesity due to excess calories with serious comorbidity and body mass index (BMI) of 30.0 to 30.9 in adult  Statin intolerance  Myalgia due to statin    Plan:     During the course of the visit the patient was educated and counseled about appropriate screening and preventive services including:   Pneumococcal vaccine  Influenza vaccine Colorectal cancer screening Advanced directives: discussed  Diet review for nutrition referral? Yes ____  Not Indicated __X__   Patient Instructions (the written plan) was given to the patient.  CAD (coronary artery disease) He denies any angina.  We need to get him off lipitor and start him on a PSK9 inhibitor.  He is to continue beta blocker, statin and ASA.  Essential hypertension He has whitecoat syndrome at times.  He checks his BP at home at times and it is controlled.  We will see what his BP is running on his next visit.  Gastroesophageal reflux disease His reflux is controlled on pepcid.  CKD (chronic kidney disease) stage 3, GFR 30-59 ml/min We tried him on farxiga but he could not tolerate this.  We will continue control of his HTN and he needs to work on his weight.  He is on an ACE-I.  He is to avoid NSAIDS.  We will check his BMP  and urine studies today.  BMI 30.0-30.9,adult He has lost some weight.  I want him to continue to eat healthy, exercise and be active and lose weight.  Class 1 obesity due to excess calories with serious comorbidity and body mass index (BMI) of 30.0 to 30.9 in adult Plan as above.  GAD (generalized anxiety disorder) There is no change to his anxiety.  He will continue on xanax.  History of prostate cancer He does not have an appointment yet with urology.  We will check a PSA on him today.  Hyperlipidemia I initially thought his shoulder pain was arthritic but he is now telling me the pain his his shoulders went away with stopping lipitor in 06/2023.  I want him to stop the lipitor and we will get him situated to get on repatha.  His goal LDL<70.  Myalgia due to statin He has statin intolerance to pravastatin, atorvastatin and rosuvastatin.  Statin intolerance Plan as above.   Prevention Health maintenance discussed.  He is going to see GI this year for colonoscopy.  We will obtain some yearly labs.    Medicare Attestation I have personally reviewed: The patient's medical and social history Their use of alcohol, tobacco or illicit drugs Their current medications and supplements The patient's functional ability including ADLs,fall risks, home safety risks, cognitive, and hearing and visual impairment Diet and physical activities Evidence for depression or mood disorders  The patient's weight, height, and BMI have been recorded in the chart.  I have made referrals, counseling, and provided education to the patient based on review of the above and I have provided the patient with a written personalized care plan for preventive services.     Crist Fat, MD   10/12/2023

## 2023-10-12 NOTE — Assessment & Plan Note (Signed)
 Plan as above.

## 2023-10-12 NOTE — Assessment & Plan Note (Signed)
 He has whitecoat syndrome at times.  He checks his BP at home at times and it is controlled.  We will see what his BP is running on his next visit.

## 2023-10-13 LAB — PSA: Prostate Specific Ag, Serum: <0.1 ng/mL (ref 0.0–4.0)

## 2023-10-13 LAB — CBC WITH DIFFERENTIAL/PLATELET
Basophils Absolute: 0.1 10*3/uL (ref 0.0–0.2)
Basos: 1 %
EOS (ABSOLUTE): 0.2 10*3/uL (ref 0.0–0.4)
Eos: 3 %
Hematocrit: 45.9 % (ref 37.5–51.0)
Hemoglobin: 14.6 g/dL (ref 13.0–17.7)
Immature Grans (Abs): 0 10*3/uL (ref 0.0–0.1)
Immature Granulocytes: 0 %
Lymphocytes Absolute: 2.2 10*3/uL (ref 0.7–3.1)
Lymphs: 25 %
MCH: 27.4 pg (ref 26.6–33.0)
MCHC: 31.8 g/dL (ref 31.5–35.7)
MCV: 86 fL (ref 79–97)
Monocytes Absolute: 0.7 10*3/uL (ref 0.1–0.9)
Monocytes: 8 %
Neutrophils Absolute: 5.7 10*3/uL (ref 1.4–7.0)
Neutrophils: 63 %
Platelets: 214 10*3/uL (ref 150–450)
RBC: 5.33 x10E6/uL (ref 4.14–5.80)
RDW: 12.9 % (ref 11.6–15.4)
WBC: 8.9 10*3/uL (ref 3.4–10.8)

## 2023-10-13 LAB — CMP14 + ANION GAP
ALT: 28 IU/L (ref 0–44)
AST: 22 IU/L (ref 0–40)
Albumin: 4.6 g/dL (ref 3.9–4.9)
Alkaline Phosphatase: 89 IU/L (ref 44–121)
Anion Gap: 17 mmol/L (ref 10.0–18.0)
BUN/Creatinine Ratio: 12 (ref 10–24)
BUN: 16 mg/dL (ref 8–27)
Bilirubin Total: 0.7 mg/dL (ref 0.0–1.2)
CO2: 22 mmol/L (ref 20–29)
Calcium: 9.6 mg/dL (ref 8.6–10.2)
Chloride: 105 mmol/L (ref 96–106)
Creatinine, Ser: 1.36 mg/dL — ABNORMAL HIGH (ref 0.76–1.27)
Globulin, Total: 2.2 g/dL (ref 1.5–4.5)
Glucose: 97 mg/dL (ref 70–99)
Potassium: 4.5 mmol/L (ref 3.5–5.2)
Sodium: 144 mmol/L (ref 134–144)
Total Protein: 6.8 g/dL (ref 6.0–8.5)
eGFR: 56 mL/min/{1.73_m2} — ABNORMAL LOW (ref >59)

## 2023-10-13 LAB — LIPID PANEL
Chol/HDL Ratio: 3.1 ratio (ref 0.0–5.0)
Cholesterol, Total: 120 mg/dL (ref 100–199)
HDL: 39 mg/dL — ABNORMAL LOW (ref >39)
LDL Chol Calc (NIH): 60 mg/dL (ref 0–99)
Triglycerides: 117 mg/dL (ref 0–149)
VLDL Cholesterol Cal: 21 mg/dL (ref 5–40)

## 2023-10-13 LAB — TSH: TSH: 1.1 u[IU]/mL (ref 0.450–4.500)

## 2023-10-13 LAB — MICROALBUMIN / CREATININE URINE RATIO
Creatinine, Urine: 320.2 mg/dL
Microalb/Creat Ratio: 3 mg/g{creat} (ref 0–29)
Microalbumin, Urine: 10.1 ug/mL

## 2023-10-13 LAB — HEMOGLOBIN A1C
Est. average glucose Bld gHb Est-mCnc: 123 mg/dL
Hgb A1c MFr Bld: 5.9 % — ABNORMAL HIGH (ref 4.8–5.6)

## 2023-10-18 NOTE — Progress Notes (Signed)
 His labs look good and his cholesterol is controlled. Continue all his meds.  Pt aware of labs

## 2023-11-03 DIAGNOSIS — Z8619 Personal history of other infectious and parasitic diseases: Secondary | ICD-10-CM | POA: Diagnosis not present

## 2023-11-03 DIAGNOSIS — K219 Gastro-esophageal reflux disease without esophagitis: Secondary | ICD-10-CM | POA: Diagnosis not present

## 2023-11-03 DIAGNOSIS — Z860100 Personal history of colon polyps, unspecified: Secondary | ICD-10-CM | POA: Diagnosis not present

## 2023-11-22 ENCOUNTER — Other Ambulatory Visit: Payer: Self-pay | Admitting: Internal Medicine

## 2023-12-05 DIAGNOSIS — Z09 Encounter for follow-up examination after completed treatment for conditions other than malignant neoplasm: Secondary | ICD-10-CM | POA: Diagnosis not present

## 2023-12-05 DIAGNOSIS — K5730 Diverticulosis of large intestine without perforation or abscess without bleeding: Secondary | ICD-10-CM | POA: Diagnosis not present

## 2023-12-05 DIAGNOSIS — D120 Benign neoplasm of cecum: Secondary | ICD-10-CM | POA: Diagnosis not present

## 2023-12-05 DIAGNOSIS — Z860101 Personal history of adenomatous and serrated colon polyps: Secondary | ICD-10-CM | POA: Diagnosis not present

## 2023-12-07 DIAGNOSIS — D120 Benign neoplasm of cecum: Secondary | ICD-10-CM | POA: Diagnosis not present

## 2023-12-22 DIAGNOSIS — N3946 Mixed incontinence: Secondary | ICD-10-CM | POA: Diagnosis not present

## 2023-12-22 DIAGNOSIS — R350 Frequency of micturition: Secondary | ICD-10-CM | POA: Diagnosis not present

## 2024-01-11 ENCOUNTER — Ambulatory Visit: Admitting: Internal Medicine

## 2024-01-11 ENCOUNTER — Encounter: Payer: Self-pay | Admitting: Internal Medicine

## 2024-01-11 VITALS — BP 136/68 | HR 55 | Temp 97.7°F | Resp 18 | Ht 65.0 in | Wt 181.8 lb

## 2024-01-11 DIAGNOSIS — E782 Mixed hyperlipidemia: Secondary | ICD-10-CM | POA: Diagnosis not present

## 2024-01-11 DIAGNOSIS — T466X5A Adverse effect of antihyperlipidemic and antiarteriosclerotic drugs, initial encounter: Secondary | ICD-10-CM

## 2024-01-11 DIAGNOSIS — I10 Essential (primary) hypertension: Secondary | ICD-10-CM

## 2024-01-11 DIAGNOSIS — M791 Myalgia, unspecified site: Secondary | ICD-10-CM

## 2024-01-11 DIAGNOSIS — N1831 Chronic kidney disease, stage 3a: Secondary | ICD-10-CM

## 2024-01-11 DIAGNOSIS — Z789 Other specified health status: Secondary | ICD-10-CM

## 2024-01-11 MED ORDER — FAMOTIDINE 40 MG PO TABS: 40.0000 mg | ORAL_TABLET | Freq: Two times a day (BID) | ORAL | 1 refills | Status: DC

## 2024-01-11 MED ORDER — ALPRAZOLAM 0.5 MG PO TABS: 0.5000 mg | ORAL_TABLET | Freq: Two times a day (BID) | ORAL | 2 refills | Status: DC | PRN

## 2024-01-11 MED ORDER — LISINOPRIL 40 MG PO TABS: 40.0000 mg | ORAL_TABLET | Freq: Every day | ORAL | 1 refills | Status: DC

## 2024-01-11 NOTE — Assessment & Plan Note (Signed)
 Plan as above.

## 2024-01-11 NOTE — Assessment & Plan Note (Signed)
 The patient has a statin intolerance and statin myopathy.  He remains on lipitor 80mg  but takes it every other day.  I tried to get him on repatha for his history of CAD but he states his insurance would not pay for this.  We will check a FLP and CK level today.

## 2024-01-11 NOTE — Assessment & Plan Note (Signed)
 His BP is currently controlled.  We will continue his current medications.  He is to avoid salt.

## 2024-01-11 NOTE — Progress Notes (Signed)
 Office Visit  Subjective   Patient ID: Rodney Young   DOB: 07-24-1956   Age: 68 y.o.   MRN: [id number]   Chief Complaint Chief Complaint  Patient presents with   Follow-up    31M F/U     History of Present Illness Josel M. Leazer returns today for routine followup on his cholesterol. The patient does have a history of CAD.   He saw his cardiologist on 05/04/2023 where he was complaining of arthralgias in his bilateral shoulders and hips that especially hurt first thing in the morning.  He was not complaining of myalgias.  He tells me once he got up and moving around thru the day, this shoulder pain would go away.  His cardiologist asked him to cut his lipitor dose in half but he continued to have arthralgias.  He saw the clinical pharmacist on 06/19/2023 who asked him to stop his lipitor to see if his pain went away.  The patient told me at that time that his pain improved but he was still having pain in his shoulders and he is using biofreeze.  Today, he tells me that when he stopped the atorvastatin  in 06/2023, his shoulder pain went completely away.  His  FLP done on 04/10/2023 and his LDL was at goal at 60.  We repeated his FLP in 08/2023 while he was off atorvastatin  for 3 months and his LDL went to 156.  I asked him to restart the atorvastatin  and he states he did in 09/2023 and his shoulder pain came back.   Again, we noted his cholesterol was elevated on his yearly exam from 10/2018 where I initially started him on pravastatin.  He began having myalgias with pravastatin so we switched him to Lipitor 20mg  which he was taking every other day.  We then changed his Lipitor to crestor in the setting of CAD which was diagnosed in 09/2019.  He was on crestor but his cholesterol was not controlled and he had myalgias with crestor.  We changed him to Lipitor 80mg  daily and added zetia  10mg  daily.  On her last visit 3 months ago, I asked him to stop the lipitor and tried to put him on repatha but his  insurance would not pay for this.  He is back on atorvastatin  80mg  which he is taking every other day.  He is still having pain in his shoulders.  He specifically denies abdominal pain, nausea, vomiting, diarrhea, and fatigue. He remains on dietary management as well as the following cholesterol lowering medications with atorvastatin  80mg  every other day and zetia  10mg  daily.  He is fasting in anticipation for labs today.    The patient is a 68 year old Caucasian/White male who presents for a follow-up evaluation of hypertension.  He did see cardiology on 05/04/2023 where they noted he still felt some dizziness but they did not change his BP meds.  He noticed this when he took amlodipine  in the morning.  Over the interim, he switched the amlodipine  to taking it at night and his dizziness is now resolved.    He did see his cardiologist in 12/2021 where they noted his BP was elevated at that time and they increased his amlodipine  from 5mg  to 10mg  daily.    He was having some dizziness where I also discontinued his HCTZ component of his lisinopril -HCT.  The patient has been checking his blood pressure at home.  He states his systolic blood pressure runs in the 130's at home.  The patient's current medications include: lisinopril  40mg  daily, amlodipine  10mg  qhs and carvedilol  6.25mg  BID.  The patient has been tolerating his medications well. The patient denies any headache, shortness of breath, weakness/numbness, and edema. He does have a history of white coat syndrome.   He also has a history of Stage IIIa CKD which I believe is due to his history of HTN.  His creatinine over the last 3-4 years has been running from 1.3-1.4 with GFR of 50-60.  He denies use of NSAIDS.  On his last visit, we did start him on farxiga but he states he had side effects of dizziness when taking it and he stopped it.  He was not having increased urinations with the medication.  He also states he cannot afford it as well.  He is just on  lisinopril  for his kidneys at this time.       Past Medical History Past Medical History:  Diagnosis Date   CKD (chronic kidney disease), stage III (HCC)    Coronary artery disease    GAD (generalized anxiety disorder)    GERD (gastroesophageal reflux disease)    History of prostate cancer    Hyperlipidemia    Hypertension    Stress incontinence    Vitamin B12 deficiency      Allergies Allergies  Allergen Reactions   Imdur  [Isosorbide  Nitrate] Other (See Comments)     headache     Medications  Current Outpatient Medications:    ALPRAZolam  (XANAX ) 0.5 MG tablet, Take 1 tablet (0.5 mg total) by mouth every 12 (twelve) hours as needed for anxiety., Disp: 45 tablet, Rfl: 2   amLODipine  (NORVASC ) 10 MG tablet, Take 1 tablet (10 mg total) by mouth daily., Disp: 90 tablet, Rfl: 2   aspirin  81 MG EC tablet, Take 1 tablet (81 mg total) by mouth daily. Swallow whole., Disp: 30 tablet, Rfl: 12   carvedilol  (COREG ) 6.25 MG tablet, Take 1 tablet by mouth twice daily, Disp: 180 tablet, Rfl: 3   ezetimibe  (ZETIA ) 10 MG tablet, Take 1 tablet (10 mg total) by mouth daily., Disp: 90 tablet, Rfl: 3   famotidine  (PEPCID ) 40 MG tablet, Take 1 tablet by mouth twice daily, Disp: 180 tablet, Rfl: 0   lisinopril  (ZESTRIL ) 40 MG tablet, Take 1 tablet (40 mg total) by mouth daily., Disp: 90 tablet, Rfl: 0   Multiple Vitamins-Minerals (CENTRUM SILVER PO), Take 1 tablet by mouth daily., Disp: , Rfl:    Multiple Vitamins-Minerals (PRESERVISION AREDS 2 PO), Take 1 tablet by mouth daily., Disp: , Rfl:    MYRBETRIQ 50 MG TB24 tablet, Take 50 mg by mouth daily., Disp: , Rfl:    vitamin B-12 (CYANOCOBALAMIN) 1000 MCG tablet, Take 1,000 mcg by mouth daily., Disp: , Rfl:    Review of Systems Review of Systems  Constitutional:  Negative for chills, fever and malaise/fatigue.  Eyes:  Negative for blurred vision and double vision.  Respiratory:  Negative for cough and shortness of breath.   Cardiovascular:   Negative for chest pain, palpitations and leg swelling.  Gastrointestinal:  Negative for abdominal pain, constipation, diarrhea, nausea and vomiting.  Genitourinary:  Negative for frequency.  Musculoskeletal:  Positive for myalgias.  Skin:  Negative for itching and rash.  Neurological:  Negative for dizziness, weakness and headaches.  Endo/Heme/Allergies:  Negative for polydipsia.       Objective:    Vitals BP 136/68   Pulse (!) 55   Temp 97.7 F (36.5 C) (Oral)   Resp 18  Ht 5' 5 (1.651 m)   Wt 181 lb 12.8 oz (82.5 kg)   SpO2 98%   BMI 30.25 kg/m    Physical Examination Physical Exam Constitutional:      Appearance: Normal appearance. He is not ill-appearing.  Neck:     Vascular: No carotid bruit.   Cardiovascular:     Rate and Rhythm: Normal rate and regular rhythm.     Pulses: Normal pulses.     Heart sounds: No murmur heard.    No friction rub. No gallop.  Pulmonary:     Effort: Pulmonary effort is normal. No respiratory distress.     Breath sounds: No wheezing, rhonchi or rales.  Abdominal:     General: Abdomen is flat. Bowel sounds are normal. There is no distension.     Palpations: Abdomen is soft.     Tenderness: There is no abdominal tenderness.   Musculoskeletal:     Right lower leg: No edema.     Left lower leg: No edema.   Skin:    General: Skin is warm and dry.     Findings: No rash.   Neurological:     General: No focal deficit present.     Mental Status: He is alert and oriented to person, place, and time.   Psychiatric:        Mood and Affect: Mood normal.        Behavior: Behavior normal.        Assessment & Plan:   Essential hypertension His BP is currently controlled.  We will continue his current medications.  He is to avoid salt.  CKD (chronic kidney disease) stage 3, GFR 30-59 ml/min We will check his BMP today.  We will continue control of his HTN.  He is to work on his weight and try to lose weight.  He is to also avoid  NsAIDS.  Hyperlipidemia The patient has a statin intolerance and statin myopathy.  He remains on lipitor 80mg  but takes it every other day.  I tried to get him on repatha for his history of CAD but he states his insurance would not pay for this.  We will check a FLP and CK level today.  Myalgia due to statin Plan as above.  Statin intolerance Plan as above.    Return in about 3 months (around 04/12/2024).   Wayne Haines, MD

## 2024-01-11 NOTE — Assessment & Plan Note (Signed)
 We will check his BMP today.  We will continue control of his HTN.  He is to work on his weight and try to lose weight.  He is to also avoid NsAIDS.

## 2024-01-12 LAB — CMP14 + ANION GAP
ALT: 24 IU/L (ref 0–44)
AST: 17 IU/L (ref 0–40)
Albumin: 4.3 g/dL (ref 3.9–4.9)
Alkaline Phosphatase: 72 IU/L (ref 44–121)
Anion Gap: 13 mmol/L (ref 10.0–18.0)
BUN/Creatinine Ratio: 16 (ref 10–24)
BUN: 20 mg/dL (ref 8–27)
Bilirubin Total: 0.4 mg/dL (ref 0.0–1.2)
CO2: 24 mmol/L (ref 20–29)
Calcium: 9.3 mg/dL (ref 8.6–10.2)
Chloride: 106 mmol/L (ref 96–106)
Creatinine, Ser: 1.22 mg/dL (ref 0.76–1.27)
Globulin, Total: 1.7 g/dL (ref 1.5–4.5)
Glucose: 116 mg/dL — ABNORMAL HIGH (ref 70–99)
Potassium: 4.6 mmol/L (ref 3.5–5.2)
Sodium: 143 mmol/L (ref 134–144)
Total Protein: 6 g/dL (ref 6.0–8.5)
eGFR: 64 mL/min/{1.73_m2} (ref >59)

## 2024-01-12 LAB — LIPID PANEL
Chol/HDL Ratio: 3.4 ratio (ref 0.0–5.0)
Cholesterol, Total: 123 mg/dL (ref 100–199)
HDL: 36 mg/dL — ABNORMAL LOW (ref >39)
LDL Chol Calc (NIH): 54 mg/dL (ref 0–99)
Triglycerides: 205 mg/dL — ABNORMAL HIGH (ref 0–149)
VLDL Cholesterol Cal: 33 mg/dL (ref 5–40)

## 2024-01-12 LAB — CK: Total CK: 94 U/L (ref 41–331)

## 2024-01-17 DIAGNOSIS — L821 Other seborrheic keratosis: Secondary | ICD-10-CM | POA: Diagnosis not present

## 2024-01-17 DIAGNOSIS — L814 Other melanin hyperpigmentation: Secondary | ICD-10-CM | POA: Diagnosis not present

## 2024-01-17 DIAGNOSIS — L578 Other skin changes due to chronic exposure to nonionizing radiation: Secondary | ICD-10-CM | POA: Diagnosis not present

## 2024-01-17 DIAGNOSIS — L570 Actinic keratosis: Secondary | ICD-10-CM | POA: Diagnosis not present

## 2024-01-17 DIAGNOSIS — L820 Inflamed seborrheic keratosis: Secondary | ICD-10-CM | POA: Diagnosis not present

## 2024-01-17 DIAGNOSIS — D225 Melanocytic nevi of trunk: Secondary | ICD-10-CM | POA: Diagnosis not present

## 2024-01-18 ENCOUNTER — Ambulatory Visit: Payer: Self-pay

## 2024-01-18 NOTE — Progress Notes (Signed)
 Patient called.  Patient aware.  I have called and informed the patient  His labs look good.  His cholesterol is doing well.  If he can tolerate the lipitor, continue on it. [name]  Pt aware, states he feels fine on the Lipitor.

## 2024-03-13 DIAGNOSIS — I10 Essential (primary) hypertension: Secondary | ICD-10-CM | POA: Insufficient documentation

## 2024-03-13 DIAGNOSIS — N183 Chronic kidney disease, stage 3 unspecified: Secondary | ICD-10-CM | POA: Insufficient documentation

## 2024-03-13 DIAGNOSIS — E538 Deficiency of other specified B group vitamins: Secondary | ICD-10-CM | POA: Insufficient documentation

## 2024-03-14 ENCOUNTER — Encounter: Payer: Self-pay | Admitting: Cardiology

## 2024-03-14 ENCOUNTER — Ambulatory Visit: Attending: Cardiology | Admitting: Cardiology

## 2024-03-14 VITALS — BP 138/78 | HR 54 | Ht 65.0 in | Wt 181.8 lb

## 2024-03-14 DIAGNOSIS — I2510 Atherosclerotic heart disease of native coronary artery without angina pectoris: Secondary | ICD-10-CM

## 2024-03-14 DIAGNOSIS — E66811 Obesity, class 1: Secondary | ICD-10-CM | POA: Diagnosis not present

## 2024-03-14 DIAGNOSIS — I10 Essential (primary) hypertension: Secondary | ICD-10-CM

## 2024-03-14 DIAGNOSIS — E782 Mixed hyperlipidemia: Secondary | ICD-10-CM

## 2024-03-14 DIAGNOSIS — Z951 Presence of aortocoronary bypass graft: Secondary | ICD-10-CM | POA: Diagnosis not present

## 2024-03-14 DIAGNOSIS — Z6830 Body mass index (BMI) 30.0-30.9, adult: Secondary | ICD-10-CM | POA: Diagnosis not present

## 2024-03-14 DIAGNOSIS — E6609 Other obesity due to excess calories: Secondary | ICD-10-CM

## 2024-03-14 MED ORDER — FISH OIL 1000 MG PO CAPS: 2.0000 | ORAL_CAPSULE | Freq: Two times a day (BID) | ORAL | Status: AC

## 2024-03-14 MED ORDER — CARVEDILOL 6.25 MG PO TABS: 6.2500 mg | ORAL_TABLET | Freq: Two times a day (BID) | ORAL | 3 refills | Status: AC

## 2024-03-14 MED ORDER — AMLODIPINE BESYLATE 10 MG PO TABS: 10.0000 mg | ORAL_TABLET | Freq: Every day | ORAL | 3 refills | Status: AC

## 2024-03-14 NOTE — Progress Notes (Signed)
 Cardiology Office Note:    Date:  03/14/2024   ID:  Rodney Young, DOB 07/06/1956, MRN [id number]  PCP:  Fleeta Valeria Mayo, MD  Cardiologist:  Jennifer JONELLE Crape, MD   Referring MD: Fleeta Valeria Mayo, MD    ASSESSMENT:    1. Coronary artery disease involving native coronary artery of native heart without angina pectoris   2. Essential hypertension   3. S/P CABG x 3   4. Mixed hyperlipidemia   5. Class 1 obesity due to excess calories with serious comorbidity and body mass index (BMI) of 30.0 to 30.9 in adult    PLAN:    In order of problems listed above:  Coronary artery disease: Secondary prevention stressed with the patient.  Importance of compliance with diet medication stressed and patient verbalized standing.  He was advised to walk at least half an hour a day on a daily basis. Essential hypertension: Blood pressure is stable and diet was emphasized.  Lifestyle modification urged. Mixed dyslipidemia: On lipid-lowering medications triglycerides are elevated and I counseled him about diet extensively and questions were answered to satisfaction. Obesity: Weight reduction stressed and he promises to do better.  Risks of obesity explained. Patient will be seen in follow-up appointment in 6 months or earlier if the patient has any concerns.    Medication Adjustments/Labs and Tests Ordered: Current medicines are reviewed at length with the patient today.  Concerns regarding medicines are outlined above.  No orders of the defined types were placed in this encounter.  No orders of the defined types were placed in this encounter.    No chief complaint on file.    History of Present Illness:    Rodney Young is a 68 y.o. male.  Patient has past medical history of coronary artery disease post CABG surgery, essential hypertension, mixed dyslipidemia and obesity.  He is an active gentleman but does not exercise on a regular basis.  He denies any chest pain orthopnea or PND.  At the  time of my evaluation, the patient is alert awake oriented and in no distress.  Past Medical History:  Diagnosis Date   Abnormal stress test 10/14/2019   BMI 30.0-30.9,adult 10/12/2023   CAD (coronary artery disease) 10/16/2019   CKD (chronic kidney disease) stage 3, GFR 30-59 ml/min (HCC)    CKD (chronic kidney disease), stage III (HCC)    Class 1 obesity due to excess calories with serious comorbidity and body mass index (BMI) of 30.0 to 30.9 in adult 10/12/2023   Class 1 obesity with serious comorbidity and body mass index (BMI) of 31.0 to 31.9 in adult 09/30/2022   Essential hypertension    GAD (generalized anxiety disorder)    Gastroesophageal reflux disease 09/30/2022   History of prostate cancer    Hyperlipidemia    Hypertension    Insomnia 06/30/2022   Myalgia due to statin 10/12/2023   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   Postop check 11/19/2019   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   Statin intolerance 10/12/2023   Stress incontinence    Syncope 12/25/2019   Vitamin B12 deficiency     Past Surgical History:  Procedure Laterality Date   CORONARY ARTERY BYPASS GRAFT N/A 10/21/2019   Procedure: CORONARY ARTERY BYPASS GRAFTING (CABG) x three, using left internal mammary artery, left radial artery harvested endoscopically and right greater saphenous vein harvested endoscopically;  Surgeon: Fleeta Hanford Coy, MD;  Location: [facility] OR;  Service: Open Heart Surgery;  Laterality: N/A;   LEFT HEART  CATH AND CORONARY ANGIOGRAPHY N/A 10/14/2019   Procedure: LEFT HEART CATH AND CORONARY ANGIOGRAPHY;  Surgeon: Court Dorn PARAS, MD;  Location: [facility] INVASIVE CV LAB;  Service: Cardiovascular;  Laterality: N/A;   PROSTATECTOMY     RADIAL ARTERY HARVEST Left 10/21/2019   Procedure: RIGHT RADIAL ARTERY HARVESTED ENDOSCOPICALLY;  Surgeon: Fleeta Hanford Coy, MD;  Location: [facility] OR;  Service: Open Heart Surgery;  Laterality: Left;   TEE WITHOUT CARDIOVERSION N/A 10/21/2019   Procedure: TRANSESOPHAGEAL  ECHOCARDIOGRAM (TEE);  Surgeon: Fleeta Hanford, Coy, MD;  Location: [facility] OR;  Service: Open Heart Surgery;  Laterality: N/A;    Current Medications: Current Meds  Medication Sig   ALPRAZolam (XANAX) 0.5 MG tablet Take 1 tablet (0.5 mg total) by mouth every 12 (twelve) hours as needed for anxiety.   amLODipine (NORVASC) 10 MG tablet Take 1 tablet (10 mg total) by mouth daily.   aspirin 81 MG EC tablet Take 1 tablet (81 mg total) by mouth daily. Swallow whole.   atorvastatin (LIPITOR) 80 MG tablet Take 80 mg by mouth every other day.   carvedilol (COREG) 6.25 MG tablet Take 1 tablet by mouth twice daily   ezetimibe (ZETIA) 10 MG tablet Take 1 tablet (10 mg total) by mouth daily.   famotidine (PEPCID) 40 MG tablet Take 1 tablet (40 mg total) by mouth 2 (two) times daily.   lisinopril (ZESTRIL) 40 MG tablet Take 1 tablet (40 mg total) by mouth daily.   Multiple Vitamins-Minerals (CENTRUM SILVER PO) Take 1 tablet by mouth daily.   Multiple Vitamins-Minerals (PRESERVISION AREDS 2 PO) Take 1 tablet by mouth daily.   MYRBETRIQ 50 MG TB24 tablet Take 50 mg by mouth daily.   vitamin B-12 (CYANOCOBALAMIN) 1000 MCG tablet Take 1,000 mcg by mouth daily.     Allergies:   Imdur [isosorbide nitrate]   Social History   Socioeconomic History   Marital status: Single    Spouse name: Not on file   Number of children: Not on file   Years of education: Not on file   Highest education level: Not on file  Occupational History   Not on file  Tobacco Use   Smoking status: Never   Smokeless tobacco: Never  Vaping Use   Vaping status: Never Used  Substance and Sexual Activity   Alcohol use: Not Currently   Drug use: Never   Sexual activity: Not on file  Other Topics Concern   Not on file  Social History Narrative   Not on file   Social Drivers of Health   Financial Resource Strain: Not on file  Food Insecurity: Not on file  Transportation Needs: Not on file  Physical Activity: Not on file  Stress:  Not on file  Social Connections: Not on file     Family History: The patient's family history includes Heart attack in his father.  ROS:   Please see the history of present illness.    All other systems reviewed and are negative.  EKGs/Labs/Other Studies Reviewed:    The following studies were reviewed today: I discussed my findings with the patient.   Recent Labs: 10/12/2023: Hemoglobin 14.6; Platelets 214; TSH 1.100 01/11/2024: ALT 24; BUN 20; Creatinine, Ser 1.22; Potassium 4.6; Sodium 143  Recent Lipid Panel    Component Value Date/Time   CHOL 123 01/11/2024 0927   TRIG 205 (H) 01/11/2024 0927   HDL 36 (L) 01/11/2024 0927   CHOLHDL 3.4 01/11/2024 0927   LDLCALC 54 01/11/2024 0927    Physical  Exam:    VS:  BP 138/78   Pulse (!) 54   Ht 5' 5 (1.651 m)   Wt 181 lb 12.8 oz (82.5 kg)   SpO2 97%   BMI 30.25 kg/m     Wt Readings from Last 3 Encounters:  03/14/24 181 lb 12.8 oz (82.5 kg)  01/11/24 181 lb 12.8 oz (82.5 kg)  10/12/23 181 lb 9.6 oz (82.4 kg)     GEN: Patient is in no acute distress HEENT: Normal NECK: No JVD; No carotid bruits LYMPHATICS: No lymphadenopathy CARDIAC: Hear sounds regular, 2/6 systolic murmur at the apex. RESPIRATORY:  Clear to auscultation without rales, wheezing or rhonchi  ABDOMEN: Soft, non-tender, non-distended MUSCULOSKELETAL:  No edema; No deformity  SKIN: Warm and dry NEUROLOGIC:  Alert and oriented x 3 PSYCHIATRIC:  Normal affect   Signed, Jennifer JONELLE Crape, MD  03/14/2024 8:40 AM    [facility]

## 2024-03-14 NOTE — Patient Instructions (Signed)
 Medication Instructions:  Your physician has recommended you make the following change in your medication:   Start Fish Oil 2,000 mg twice daily.  *If you need a refill on your cardiac medications before your next appointment, please call your pharmacy*   Lab Work: None ordered If you have labs (blood work) drawn today and your tests are completely normal, you will receive your results only by: MyChart Message (if you have MyChart) OR A paper copy in the mail If you have any lab test that is abnormal or we need to change your treatment, we will call you to review the results.   Testing/Procedures: None ordered   Follow-Up: At [facility], you and your health needs are our priority.  As part of our continuing mission to provide you with exceptional heart care, we have created designated Provider Care Teams.  These Care Teams include your primary Cardiologist (physician) and Advanced Practice Providers (APPs -  Physician Assistants and Nurse Practitioners) who all work together to provide you with the care you need, when you need it.  We recommend signing up for the patient portal called MyChart.  Sign up information is provided on this After Visit Summary.  MyChart is used to connect with patients for Virtual Visits (Telemedicine).  Patients are able to view lab/test results, encounter notes, upcoming appointments, etc.  Non-urgent messages can be sent to your provider as well.   To learn more about what you can do with MyChart, go to [email or web address].    Your next appointment:   12 month(s)  The format for your next appointment:   In Person  Provider:   Jennifer Crape, MD    Other Instructions none  Important Information About Sugar

## 2024-04-11 ENCOUNTER — Encounter: Payer: Self-pay | Admitting: Internal Medicine

## 2024-04-11 ENCOUNTER — Ambulatory Visit: Admitting: Internal Medicine

## 2024-04-11 VITALS — BP 134/80 | HR 52 | Temp 97.8°F | Resp 18 | Ht 65.0 in | Wt 182.6 lb

## 2024-04-11 DIAGNOSIS — I25810 Atherosclerosis of coronary artery bypass graft(s) without angina pectoris: Secondary | ICD-10-CM | POA: Diagnosis not present

## 2024-04-11 DIAGNOSIS — N1831 Chronic kidney disease, stage 3a: Secondary | ICD-10-CM | POA: Diagnosis not present

## 2024-04-11 DIAGNOSIS — I10 Essential (primary) hypertension: Secondary | ICD-10-CM

## 2024-04-11 DIAGNOSIS — T466X5A Adverse effect of antihyperlipidemic and antiarteriosclerotic drugs, initial encounter: Secondary | ICD-10-CM | POA: Diagnosis not present

## 2024-04-11 DIAGNOSIS — Z23 Encounter for immunization: Secondary | ICD-10-CM

## 2024-04-11 DIAGNOSIS — E782 Mixed hyperlipidemia: Secondary | ICD-10-CM

## 2024-04-11 DIAGNOSIS — M791 Myalgia, unspecified site: Secondary | ICD-10-CM

## 2024-04-11 NOTE — Assessment & Plan Note (Signed)
 He states that his shoulder pain occurs in the mornings but as he gets up and moves around, this pain resolves.  Again, I don't think this is myalgias but he has arthritis.  He seems to be tolerating his atorvastatin  80mg  every other day.

## 2024-04-11 NOTE — Assessment & Plan Note (Signed)
 His goal LDL <70 with his history of CAD.  Cardiology started him on fish oil  for elevated TG.  We will check his FLP on his next visit.

## 2024-04-11 NOTE — Assessment & Plan Note (Signed)
 His BP is under control.  We will continue his current BP medications.

## 2024-04-11 NOTE — Assessment & Plan Note (Signed)
 I reviewed cardiology's notes.  His CAD is stable and he denies any angina.  He is on a statin and ASA 81mg  daily.

## 2024-04-11 NOTE — Assessment & Plan Note (Signed)
 This is secondary to his HTN.  He is to avoid NSAIDS.  His insurance will not pay for farxiga.  We will continue his lisinopril .

## 2024-04-11 NOTE — Progress Notes (Signed)
 Office Visit  Subjective   Patient ID: Rodney Young   DOB: 07-25-1956   Age: 68 y.o.   MRN: [id number]   Chief Complaint Chief Complaint  Patient presents with   Follow-up    3 Month Follow Up     History of Present Illness Rodney Young is a 68 yo male who returns for followup of his moderately severe CAD which was diagnosed in 09/2019.  He last saw his cardiologist on 03/14/2024 where they felt his CAD was stable.  They stressed secondary prevention and started him on fish oil  for hypertriglyceridemia.  He previously saw the cardiology pharmacist on 06/19/2023 where they discussed his arthralgias with his statin (see below).  He was having mylagias to his atorvastatin  and they wanted him to stop this for 6-8 weeks.  If his LDL >70, they recommended repatha (See below). Again, the patient initially saw me in 08/2019 and was reporting some intermittent chest pain and left shoulder pain when he exerted himself. I sent him to cardiology where they did a heart catherization on 10/14/19 which showed a 90% distal RCA lesion, 80% proximal to mid left Cfx lesion, 75% mid LAD lesion and his LV gram showed an EF of 55-65%. He underwent a CABG x3 on 10/21/19 without complication. See PMH for summary of cardiac history and status of revascularization. His CAD is controlled with therapy as summarized in the medication list and previous notes. The patient has no comorbid conditions. He has no baseline symptoms of CAD. He has the following modifiable risk factor(s): HTN, hyperlipidemia, sedentary lifestyle, and obesity. Specifically denied complaint(s):  palpitations, orthopnea, edema, exertional dyspnea, and syncope. He did see his cardiologist in 07/2020 where he was having some feelings of chest pressure that felt like a ton of bricks. He had a previous myoview  in 12/2019 which was normal and she was not sure if this was angina.  He previously did not not tolerate Imdur  in the past.  She started him  on NTG but he  states he never takes it as he thinks the chest pain has been occurring since he had his COVID-19 vaccine.  The patient uses a riding Surveyor, mining and uses a weed eater once a week and he states this does not trigger his chest pain/angina.   Today, he denies any exertional chest pain or other problems.    Rodney Young returns today for routine followup on his cholesterol. The patient does have a history of CAD.   He saw his cardiologist on 05/04/2023 where he was complaining of arthralgias in his bilateral shoulders and hips that especially hurt first thing in the morning.  He was not complaining of myalgias.  He tells me once he got up and moving around thru the day, this shoulder pain would go away.  His cardiologist asked him to cut his lipitor dose in half but he continued to have arthralgias.  He saw the clinical pharmacist on 06/19/2023 who asked him to stop his lipitor to see if his pain went away.  The patient told me at that time that his pain improved but he was still having pain in his shoulders and he is using biofreeze.  Today, he tells me that when he stopped the atorvastatin  in 06/2023, his shoulder pain went completely away.  His  FLP done on 04/10/2023 and his LDL was at goal at 60.  We repeated his FLP in 08/2023 while he was off atorvastatin  for 3 months and his LDL went  to 156.  I asked him to restart the atorvastatin  and he states he did in 09/2023 and his shoulder pain came back.   Again, we noted his cholesterol was elevated on his yearly exam from 10/2018 where I initially started him on pravastatin.  He began having myalgias with pravastatin so we switched him to Lipitor 20mg  which he was taking every other day.  We then changed his Lipitor to crestor in the setting of CAD which was diagnosed in 09/2019.  He was on crestor but his cholesterol was not controlled and he had myalgias with crestor.  We changed him to Lipitor 80mg  daily and added zetia  10mg  daily.  On her last visit 3 months ago,  I asked him to stop the lipitor and tried to put him on repatha but his insurance would not pay for this.  He is back on atorvastatin  80mg  which he is taking every other day.   He specifically denies abdominal pain, nausea, vomiting, diarrhea, myalgias or fatigue. He remains on dietary management as well as the following cholesterol lowering medications with atorvastatin  80mg  every other day and zetia  10mg  daily and fish oil .  He is fasting in anticipation for labs today.    The patient is a 68 year old Caucasian/White male who presents for a follow-up evaluation of hypertension.  He did see cardiology on 05/04/2023 where they noted he still felt some dizziness but they did not change his BP meds.  He noticed this when he took amlodipine  in the morning.  Over the interim, he switched the amlodipine  to taking it at night and his dizziness is now resolved.    He did see his cardiologist in 12/2021 where they noted his BP was elevated at that time and they increased his amlodipine  from 5mg  to 10mg  daily.    He was having some dizziness where I also discontinued his HCTZ component of his lisinopril -HCT.  The patient has been checking his blood pressure at home.  He states his systolic blood pressure runs in the 130-140's at home.  The patient's current medications include: lisinopril  40mg  daily, amlodipine  10mg  qhs and carvedilol  6.25mg  BID.  The patient has been tolerating his medications well. The patient denies any headache, shortness of breath, weakness/numbness, and edema. He does have a history of white coat syndrome.   He also has a history of Stage IIIa CKD which I believe is due to his history of HTN.  His creatinine over the last 3-4 years has been running from 1.3-1.4 with GFR of 50-60.  He denies use of NSAIDS.  On his last visit, we did start him on farxiga but he states he had side effects of dizziness when taking it and he stopped it.  He was not having increased urinations with the medication.  He also  states he cannot afford it as well.  He is just on lisinopril  for his kidneys at this time.      Past Medical History Past Medical History:  Diagnosis Date   Abnormal stress test 10/14/2019   BMI 30.0-30.9,adult 10/12/2023   CAD (coronary artery disease) 10/16/2019   CKD (chronic kidney disease) stage 3, GFR 30-59 ml/min (HCC)    CKD (chronic kidney disease), stage III (HCC)    Class 1 obesity due to excess calories with serious comorbidity and body mass index (BMI) of 30.0 to 30.9 in adult 10/12/2023   Class 1 obesity with serious comorbidity and body mass index (BMI) of 31.0 to 31.9 in adult 09/30/2022  Essential hypertension    GAD (generalized anxiety disorder)    Gastroesophageal reflux disease 09/30/2022   History of prostate cancer    Hyperlipidemia    Hypertension    Insomnia 06/30/2022   Myalgia due to statin 10/12/2023   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   Postop check 11/19/2019   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 Swallow whole., Disp: 30 tablet, Rfl: 12   atorvastatin  (LIPITOR) 80 MG tablet, Take 80 mg by mouth every other day., Disp: , Rfl:    carvedilol  (COREG ) 6.25 MG tablet, Take 1 tablet (6.25 mg total) by mouth 2 (two) times daily., Disp: 180 tablet, Rfl: 3   ezetimibe  (ZETIA ) 10 MG tablet, Take 1 tablet (10 mg total) by mouth daily., Disp: 90 tablet, Rfl: 3   famotidine   (PEPCID ) 40 MG tablet, Take 1 tablet (40 mg total) by mouth 2 (two) times daily., Disp: 180 tablet, Rfl: 1   lisinopril  (ZESTRIL ) 40 MG tablet, Take 1 tablet (40 mg total) by mouth daily., Disp: 90 tablet, Rfl: 1   Multiple Vitamins-Minerals (CENTRUM SILVER PO), Take 1 tablet by mouth daily., Disp: , Rfl:    Multiple Vitamins-Minerals (PRESERVISION AREDS 2 PO), Take 1 tablet by mouth daily., Disp: , Rfl:    MYRBETRIQ 50 MG TB24 tablet, Take 50 mg by mouth daily., Disp: , Rfl:    Omega-3 Fatty Acids (FISH OIL ) 1000 MG CAPS, Take 2 capsules (2,000 mg total) by mouth 2 (two) times daily., Disp: , Rfl:    vitamin B-12 (CYANOCOBALAMIN) 1000 MCG tablet, Take 1,000 mcg by mouth daily., Disp: , Rfl:    Review of Systems Review of Systems  Constitutional:  Negative for chills, fever, malaise/fatigue and weight loss.  Eyes:  Negative for blurred vision and double vision.  Respiratory:  Negative for cough and shortness of breath.   Cardiovascular:  Negative for chest pain, palpitations and leg swelling.  Gastrointestinal:  Negative for abdominal pain, constipation, diarrhea, heartburn, nausea and vomiting.  Genitourinary:  Negative for frequency.  Musculoskeletal:  Negative for myalgias.  Skin:  Negative for itching and rash.  Neurological:  Negative for dizziness, weakness and headaches.  Endo/Heme/Allergies:  Negative for polydipsia.       Objective:    Vitals BP 134/80   Pulse (!) 52   Temp 97.8 F (36.6 C) (Temporal)   Resp 18   Ht 5' 5 (1.651 m)   Wt 182 lb 9.6 oz (82.8 kg)   SpO2 98%   BMI 30.39 kg/m    Physical Examination Physical Exam Constitutional:      Appearance: Normal appearance. He is not ill-appearing.  Neck:     Vascular: No carotid bruit.  Cardiovascular:     Rate and Rhythm: Normal rate and regular rhythm.     Pulses: Normal pulses.  Heart sounds: No murmur heard.    No friction rub. No gallop.  Pulmonary:     Effort: Pulmonary effort is normal. No  respiratory distress.     Breath sounds: No wheezing, rhonchi or rales.  Abdominal:     General: Abdomen is flat. Bowel sounds are normal. There is no distension.     Palpations: Abdomen is soft.     Tenderness: There is no abdominal tenderness.  Musculoskeletal:     Right lower leg: No edema.     Left lower leg: No edema.  Skin:    General: Skin is warm and dry.     Findings: No rash.  Neurological:     General: No focal deficit present.     Mental Status: He is alert and oriented to person, place, and time.  Psychiatric:        Mood and Affect: Mood normal.        Behavior: Behavior normal.        Assessment & Plan:   Essential hypertension His BP is under control.  We will continue his current BP medications.  CAD (coronary artery disease) I reviewed cardiology's notes.  His CAD is stable and he denies any angina.  He is on a statin and ASA 81mg  daily.  Stage 3a chronic kidney disease (HCC) This is secondary to his HTN.  He is to avoid NSAIDS.  His insurance will not pay for farxiga.  We will continue his lisinopril .  Myalgia due to statin He states that his shoulder pain occurs in the mornings but as he gets up and moves around, this pain resolves.  Again, I don't think this is myalgias but he has arthritis.  He seems to be tolerating his atorvastatin  80mg  every other day.  Hyperlipidemia His goal LDL <70 with his history of CAD.  Cardiology started him on fish oil  for elevated TG.  We will check his FLP on his next visit.    Return in about 3 months (around 07/11/2024).   Selinda Fleeta Finger, MD

## 2024-04-11 NOTE — Addendum Note (Signed)
 Addended by: LENETTA LACKS on: 04/11/2024 08:44 AM   Modules accepted: Orders

## 2024-06-10 ENCOUNTER — Other Ambulatory Visit: Payer: Self-pay | Admitting: Internal Medicine

## 2024-06-17 ENCOUNTER — Other Ambulatory Visit: Payer: Self-pay | Admitting: Internal Medicine

## 2024-06-17 MED ORDER — ALPRAZOLAM 0.5 MG PO TABS: 0.5000 mg | ORAL_TABLET | Freq: Two times a day (BID) | ORAL | 2 refills | Status: AC | PRN

## 2024-06-18 ENCOUNTER — Other Ambulatory Visit: Payer: Self-pay

## 2024-06-24 ENCOUNTER — Other Ambulatory Visit: Payer: Self-pay

## 2024-06-24 MED ORDER — ATORVASTATIN CALCIUM 40 MG PO TABS
40.0000 mg | ORAL_TABLET | Freq: Every day | ORAL | 0 refills | Status: AC
Start: 2024-06-24 — End: ?

## 2024-07-08 ENCOUNTER — Other Ambulatory Visit: Payer: Self-pay | Admitting: Internal Medicine

## 2024-07-11 ENCOUNTER — Encounter: Payer: Self-pay | Admitting: Internal Medicine

## 2024-07-11 ENCOUNTER — Ambulatory Visit: Admitting: Internal Medicine

## 2024-07-11 VITALS — BP 124/84 | HR 56 | Temp 98.2°F | Resp 18 | Ht 65.0 in | Wt 186.2 lb

## 2024-07-11 DIAGNOSIS — I2510 Atherosclerotic heart disease of native coronary artery without angina pectoris: Secondary | ICD-10-CM

## 2024-07-11 DIAGNOSIS — I10 Essential (primary) hypertension: Secondary | ICD-10-CM

## 2024-07-11 DIAGNOSIS — E782 Mixed hyperlipidemia: Secondary | ICD-10-CM

## 2024-07-11 DIAGNOSIS — I2583 Coronary atherosclerosis due to lipid rich plaque: Secondary | ICD-10-CM | POA: Diagnosis not present

## 2024-07-11 DIAGNOSIS — N182 Chronic kidney disease, stage 2 (mild): Secondary | ICD-10-CM | POA: Diagnosis not present

## 2024-07-11 NOTE — Assessment & Plan Note (Signed)
Will continue to monitor his renal function.

## 2024-07-11 NOTE — Assessment & Plan Note (Signed)
 He takes atorvastatin  40 mg daily, ezetimibe  10 mg daily and fish oil  2 capsules twice a day.  His cholesterol is well controlled.

## 2024-07-11 NOTE — Progress Notes (Signed)
 Office Visit  Subjective   Patient ID: Rodney Young   DOB: 06-03-1956   Age: 68 y.o.   MRN: [id number]   Chief Complaint Chief Complaint  Patient presents with   Follow-up    3 month     History of Present Illness   68 years old male with history of coronary artery disease, hypertension, hyperlipidemia, chronic kidney disease is here for follow-up.  He could not tolerate rosuvastatin and could not tolerate atorvastatin  80 mg because of muscle and body pain.  Since his dose was decreased to 40 mg daily he is feeling better.  His cholesterol was done in June shows LDL of 68 that is target controlled.  He also takes ezetimibe  10 mg daily and fish oil  2 g daily.  He has coronary artery disease and sometimes he gets chest pain but that get better when he take alprazolam .  He feels that the chest pain is related with anxiety.  He says that he has to take alprazolam  every day and sometimes he takes twice a day.  He does admit that he has low energy and anxiety problem.  He does has coronary artery disease status post coronary artery bypass graft.    He has hypertension and he takes lisinopril  40 mg daily, amlodipine  10 mg daily and carvedilol  6.25 mg twice a day.  His blood pressure is well controlled.    He has a chronic kidney disease where his GFR was 62 year ago and 64 6 months ago.  His GFR dropped to 56 in March 2025.  So technically this make him CKD 2.  Past Medical History Past Medical History:  Diagnosis Date   Abnormal stress test 10/14/2019   BMI 30.0-30.9,adult 10/12/2023   CAD (coronary artery disease) 10/16/2019   CKD (chronic kidney disease) stage 3, GFR 30-59 ml/min (HCC)    CKD (chronic kidney disease), stage III (HCC)    Class 1 obesity due to excess calories with serious comorbidity and body mass index (BMI) of 30.0 to 30.9 in adult 10/12/2023   Class 1 obesity with serious comorbidity and body mass index (BMI) of 31.0 to 31.9 in adult 09/30/2022   Essential  hypertension    GAD (generalized anxiety disorder)    Gastroesophageal reflux disease 09/30/2022   History of prostate cancer    Hyperlipidemia    Hypertension    Insomnia 06/30/2022   Myalgia due to statin 10/12/2023   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   HENT: Negative.    Respiratory: Negative.    Cardiovascular: Negative.   Gastrointestinal: Negative.   Neurological: Negative.        Objective:    Vitals BP 124/84 (BP Location: Left Arm, Patient Position: Sitting, Cuff Size: Normal)   Pulse (!) 56   Temp 98.2 F (36.8 C)   Resp 18   Ht 5' 5 (1.651 m)   Wt 186 lb 4 oz (84.5 kg)   SpO2 99%   BMI 30.99 kg/m    Physical Examination Physical Exam Constitutional:      Appearance: Normal appearance.  HENT:     Head: Normocephalic and atraumatic.  Cardiovascular:     Rate and Rhythm: Normal rate and regular rhythm.  Heart sounds: Normal heart sounds.  Pulmonary:     Effort: Pulmonary effort is normal.     Breath sounds: Normal breath sounds.  Abdominal:     General: Bowel sounds are normal.     Palpations: Abdomen is soft.  Neurological:     General: No focal deficit present.     Mental Status: He is alert and oriented to person, place, and time.        Assessment & Plan:   Essential hypertension   His blood pressure is well controlled on carvedilol  6.25 mg twice a day, lisinopril  40 mg daily and amlodipine  10 mg daily.  I will do urine microalbuminuria and CMP on next visit.  CAD (coronary artery disease)   He has coronary artery bypass graft and he follows with cardiologist once a year.  Stage 2 chronic kidney disease   Will continue to monitor his renal function.  Hyperlipidemia   He  takes atorvastatin  40 mg daily, ezetimibe  10 mg daily and fish oil  2 capsules twice a day.  His cholesterol is well controlled.    Return in about 3 months (around 10/09/2024) for For AWE examination.   Roetta Dare, MD      [1]  Allergies Allergen Reactions   Imdur  [Isosorbide  Nitrate] Other (See Comments)     headache

## 2024-07-11 NOTE — Assessment & Plan Note (Signed)
 He has coronary artery bypass graft and he follows with cardiologist once a year.

## 2024-07-11 NOTE — Assessment & Plan Note (Signed)
 His blood pressure is well controlled on carvedilol  6.25 mg twice a day, lisinopril  40 mg daily and amlodipine  10 mg daily.  I will do urine microalbuminuria and CMP on next visit.

## 2024-08-22 ENCOUNTER — Other Ambulatory Visit: Payer: Self-pay

## 2024-08-22 MED ORDER — FAMOTIDINE 40 MG PO TABS: 40.0000 mg | ORAL_TABLET | Freq: Two times a day (BID) | ORAL | 1 refills | Status: AC

## 2024-08-22 MED ORDER — LISINOPRIL 40 MG PO TABS: 40.0000 mg | ORAL_TABLET | Freq: Every day | ORAL | 1 refills | Status: AC

## 2024-10-14 ENCOUNTER — Encounter: Admitting: Internal Medicine
